# Patient Record
Sex: Female | Born: 1935 | Race: White | Hispanic: No | Smoking: Never smoker
Health system: Southern US, Community
[De-identification: ages and names within clinical notes are randomized; demographics above are authoritative.]

## PROBLEM LIST (undated history)

## (undated) DIAGNOSIS — N362 Urethral caruncle: Secondary | ICD-10-CM

## (undated) DIAGNOSIS — K259 Gastric ulcer, unspecified as acute or chronic, without hemorrhage or perforation: Secondary | ICD-10-CM

## (undated) DIAGNOSIS — Z923 Personal history of irradiation: Secondary | ICD-10-CM

## (undated) DIAGNOSIS — Z9221 Personal history of antineoplastic chemotherapy: Secondary | ICD-10-CM

## (undated) DIAGNOSIS — L570 Actinic keratosis: Secondary | ICD-10-CM

## (undated) DIAGNOSIS — E785 Hyperlipidemia, unspecified: Secondary | ICD-10-CM

## (undated) DIAGNOSIS — C439 Malignant melanoma of skin, unspecified: Secondary | ICD-10-CM

## (undated) HISTORY — DX: Hyperlipidemia, unspecified: E78.5

## (undated) HISTORY — DX: Urethral caruncle: N36.2

## (undated) HISTORY — DX: Malignant melanoma of skin, unspecified: C43.9

## (undated) HISTORY — PX: OTHER SURGICAL HISTORY: SHX169

## (undated) HISTORY — DX: Gastric ulcer, unspecified as acute or chronic, without hemorrhage or perforation: K25.9

## (undated) HISTORY — DX: Actinic keratosis: L57.0

---

## 1978-12-26 HISTORY — PX: OTHER SURGICAL HISTORY: SHX169

## 1984-12-26 HISTORY — PX: BREAST EXCISIONAL BIOPSY: SUR124

## 2000-12-26 DIAGNOSIS — C439 Malignant melanoma of skin, unspecified: Secondary | ICD-10-CM

## 2000-12-26 DIAGNOSIS — Z923 Personal history of irradiation: Secondary | ICD-10-CM

## 2000-12-26 HISTORY — DX: Personal history of irradiation: Z92.3

## 2000-12-26 HISTORY — DX: Malignant melanoma of skin, unspecified: C43.9

## 2000-12-26 HISTORY — PX: OTHER SURGICAL HISTORY: SHX169

## 2001-12-26 DIAGNOSIS — Z9221 Personal history of antineoplastic chemotherapy: Secondary | ICD-10-CM

## 2001-12-26 HISTORY — DX: Personal history of antineoplastic chemotherapy: Z92.21

## 2004-09-25 ENCOUNTER — Ambulatory Visit: Payer: Self-pay | Admitting: Oncology

## 2005-01-13 ENCOUNTER — Ambulatory Visit: Payer: Self-pay | Admitting: Oncology

## 2005-01-26 ENCOUNTER — Ambulatory Visit: Payer: Self-pay | Admitting: Oncology

## 2005-02-23 ENCOUNTER — Ambulatory Visit: Payer: Self-pay | Admitting: Oncology

## 2005-05-03 ENCOUNTER — Ambulatory Visit: Payer: Self-pay | Admitting: Internal Medicine

## 2005-06-01 ENCOUNTER — Ambulatory Visit: Payer: Self-pay | Admitting: Oncology

## 2005-06-25 ENCOUNTER — Ambulatory Visit: Payer: Self-pay | Admitting: Oncology

## 2005-09-16 ENCOUNTER — Ambulatory Visit: Payer: Self-pay | Admitting: Oncology

## 2005-10-04 ENCOUNTER — Ambulatory Visit: Payer: Self-pay | Admitting: Unknown Physician Specialty

## 2005-10-04 ENCOUNTER — Other Ambulatory Visit: Payer: Self-pay

## 2005-10-06 ENCOUNTER — Ambulatory Visit: Payer: Self-pay | Admitting: Oncology

## 2005-10-10 ENCOUNTER — Ambulatory Visit: Payer: Self-pay | Admitting: Unknown Physician Specialty

## 2005-10-26 ENCOUNTER — Ambulatory Visit: Payer: Self-pay | Admitting: Oncology

## 2006-02-03 ENCOUNTER — Ambulatory Visit: Payer: Self-pay | Admitting: Oncology

## 2006-02-23 ENCOUNTER — Ambulatory Visit: Payer: Self-pay | Admitting: Oncology

## 2006-03-10 ENCOUNTER — Ambulatory Visit: Payer: Self-pay | Admitting: Unknown Physician Specialty

## 2006-05-04 ENCOUNTER — Ambulatory Visit: Payer: Self-pay | Admitting: Internal Medicine

## 2006-06-02 ENCOUNTER — Ambulatory Visit: Payer: Self-pay | Admitting: Oncology

## 2006-06-25 ENCOUNTER — Ambulatory Visit: Payer: Self-pay | Admitting: Oncology

## 2006-07-27 ENCOUNTER — Ambulatory Visit: Payer: Self-pay | Admitting: Unknown Physician Specialty

## 2006-08-17 ENCOUNTER — Ambulatory Visit: Payer: Self-pay | Admitting: Internal Medicine

## 2006-10-10 ENCOUNTER — Ambulatory Visit: Payer: Self-pay | Admitting: Oncology

## 2006-10-26 ENCOUNTER — Ambulatory Visit: Payer: Self-pay | Admitting: Oncology

## 2007-01-04 ENCOUNTER — Ambulatory Visit: Payer: Self-pay | Admitting: Oncology

## 2007-01-30 ENCOUNTER — Ambulatory Visit: Payer: Self-pay | Admitting: Unknown Physician Specialty

## 2007-02-15 ENCOUNTER — Ambulatory Visit: Payer: Self-pay | Admitting: Oncology

## 2007-02-21 ENCOUNTER — Encounter: Payer: Self-pay | Admitting: Oncology

## 2007-02-24 ENCOUNTER — Ambulatory Visit: Payer: Self-pay | Admitting: Oncology

## 2007-02-24 ENCOUNTER — Encounter: Payer: Self-pay | Admitting: Oncology

## 2007-03-14 ENCOUNTER — Ambulatory Visit: Payer: Self-pay | Admitting: Oncology

## 2007-03-27 ENCOUNTER — Ambulatory Visit: Payer: Self-pay | Admitting: Oncology

## 2007-03-27 ENCOUNTER — Encounter: Payer: Self-pay | Admitting: Oncology

## 2007-05-07 ENCOUNTER — Ambulatory Visit: Payer: Self-pay | Admitting: Internal Medicine

## 2007-06-26 ENCOUNTER — Ambulatory Visit: Payer: Self-pay | Admitting: Oncology

## 2007-07-06 ENCOUNTER — Ambulatory Visit: Payer: Self-pay | Admitting: Oncology

## 2007-07-11 ENCOUNTER — Ambulatory Visit: Payer: Self-pay | Admitting: Oncology

## 2007-07-27 ENCOUNTER — Ambulatory Visit: Payer: Self-pay | Admitting: Oncology

## 2007-07-27 ENCOUNTER — Ambulatory Visit: Payer: Self-pay | Admitting: Unknown Physician Specialty

## 2007-10-11 ENCOUNTER — Ambulatory Visit: Payer: Self-pay | Admitting: Oncology

## 2007-10-27 ENCOUNTER — Ambulatory Visit: Payer: Self-pay | Admitting: Oncology

## 2007-12-27 ENCOUNTER — Ambulatory Visit: Payer: Self-pay | Admitting: Oncology

## 2008-01-10 ENCOUNTER — Ambulatory Visit: Payer: Self-pay | Admitting: Oncology

## 2008-01-27 ENCOUNTER — Ambulatory Visit: Payer: Self-pay | Admitting: Oncology

## 2008-04-25 ENCOUNTER — Ambulatory Visit: Payer: Self-pay | Admitting: Oncology

## 2008-05-07 ENCOUNTER — Ambulatory Visit: Payer: Self-pay | Admitting: Internal Medicine

## 2008-05-22 ENCOUNTER — Ambulatory Visit: Payer: Self-pay | Admitting: Oncology

## 2008-05-26 ENCOUNTER — Ambulatory Visit: Payer: Self-pay | Admitting: Oncology

## 2008-07-26 ENCOUNTER — Ambulatory Visit: Payer: Self-pay | Admitting: Oncology

## 2008-08-26 ENCOUNTER — Ambulatory Visit: Payer: Self-pay | Admitting: Dermatology

## 2008-10-26 ENCOUNTER — Ambulatory Visit: Payer: Self-pay | Admitting: Oncology

## 2008-11-19 ENCOUNTER — Ambulatory Visit: Payer: Self-pay | Admitting: Oncology

## 2008-11-25 ENCOUNTER — Ambulatory Visit: Payer: Self-pay | Admitting: Oncology

## 2009-04-25 ENCOUNTER — Ambulatory Visit: Payer: Self-pay | Admitting: Oncology

## 2009-05-12 ENCOUNTER — Ambulatory Visit: Payer: Self-pay | Admitting: Internal Medicine

## 2009-05-20 ENCOUNTER — Ambulatory Visit: Payer: Self-pay | Admitting: Oncology

## 2009-05-26 ENCOUNTER — Ambulatory Visit: Payer: Self-pay | Admitting: Oncology

## 2009-09-23 ENCOUNTER — Ambulatory Visit: Payer: Self-pay | Admitting: Oncology

## 2009-09-25 ENCOUNTER — Ambulatory Visit: Payer: Self-pay | Admitting: Oncology

## 2010-04-25 ENCOUNTER — Ambulatory Visit: Payer: Self-pay | Admitting: Oncology

## 2010-05-13 ENCOUNTER — Ambulatory Visit: Payer: Self-pay | Admitting: Internal Medicine

## 2010-05-20 ENCOUNTER — Ambulatory Visit: Payer: Self-pay | Admitting: Oncology

## 2010-05-26 ENCOUNTER — Ambulatory Visit: Payer: Self-pay | Admitting: Oncology

## 2011-05-16 ENCOUNTER — Ambulatory Visit: Payer: Self-pay | Admitting: Internal Medicine

## 2011-05-24 ENCOUNTER — Ambulatory Visit: Payer: Self-pay | Admitting: Oncology

## 2011-05-27 ENCOUNTER — Ambulatory Visit: Payer: Self-pay | Admitting: Oncology

## 2012-06-05 ENCOUNTER — Ambulatory Visit: Payer: Self-pay | Admitting: Oncology

## 2012-06-05 LAB — CBC CANCER CENTER
Basophil #: 0.1 x10 3/mm (ref 0.0–0.1)
Basophil %: 1.1 %
Eosinophil #: 0.1 x10 3/mm (ref 0.0–0.7)
Eosinophil %: 1.8 %
HCT: 36.4 % (ref 35.0–47.0)
HGB: 12.1 g/dL (ref 12.0–16.0)
MCH: 30.3 pg (ref 26.0–34.0)
MCV: 92 fL (ref 80–100)
Monocyte #: 0.7 x10 3/mm (ref 0.2–0.9)
Monocyte %: 10 %
Neutrophil #: 4.2 x10 3/mm (ref 1.4–6.5)
Neutrophil %: 56.3 %
Platelet: 205 x10 3/mm (ref 150–440)
RBC: 3.98 10*6/uL (ref 3.80–5.20)
RDW: 13.7 % (ref 11.5–14.5)
WBC: 7.4 x10 3/mm (ref 3.6–11.0)

## 2012-06-05 LAB — COMPREHENSIVE METABOLIC PANEL
Anion Gap: 8 (ref 7–16)
Calcium, Total: 9.4 mg/dL (ref 8.5–10.1)
Chloride: 103 mmol/L (ref 98–107)
Co2: 27 mmol/L (ref 21–32)
Creatinine: 0.89 mg/dL (ref 0.60–1.30)
EGFR (Non-African Amer.): >60
Glucose: 106 mg/dL — ABNORMAL HIGH (ref 65–99)
Osmolality: 278 (ref 275–301)
Potassium: 4.9 mmol/L (ref 3.5–5.1)

## 2012-06-25 ENCOUNTER — Ambulatory Visit: Payer: Self-pay | Admitting: Oncology

## 2012-08-24 ENCOUNTER — Ambulatory Visit: Payer: Self-pay | Admitting: Internal Medicine

## 2013-08-27 ENCOUNTER — Ambulatory Visit: Payer: Self-pay | Admitting: Internal Medicine

## 2013-09-09 ENCOUNTER — Observation Stay: Payer: Self-pay | Admitting: Internal Medicine

## 2013-09-09 LAB — URINALYSIS, COMPLETE
Bilirubin,UR: NEGATIVE
Blood: NEGATIVE
Glucose,UR: NEGATIVE mg/dL (ref 0–75)
Protein: NEGATIVE
RBC,UR: 3 /HPF (ref 0–5)
Specific Gravity: 1.01 (ref 1.003–1.030)
WBC UR: 18 /HPF (ref 0–5)

## 2013-09-09 LAB — COMPREHENSIVE METABOLIC PANEL
Alkaline Phosphatase: 63 U/L (ref 50–136)
BUN: 17 mg/dL (ref 7–18)
Bilirubin,Total: 0.3 mg/dL (ref 0.2–1.0)
Calcium, Total: 9.3 mg/dL (ref 8.5–10.1)
Co2: 26 mmol/L (ref 21–32)
Creatinine: 0.8 mg/dL (ref 0.60–1.30)
EGFR (African American): >60
EGFR (Non-African Amer.): >60
Glucose: 129 mg/dL — ABNORMAL HIGH (ref 65–99)
SGOT(AST): 29 U/L (ref 15–37)
Total Protein: 7.3 g/dL (ref 6.4–8.2)

## 2013-09-09 LAB — LIPID PANEL
HDL Cholesterol: 63 mg/dL — ABNORMAL HIGH (ref 40–60)
Triglycerides: 134 mg/dL (ref 0–200)

## 2013-09-09 LAB — CBC
HCT: 37.3 % (ref 35.0–47.0)
HGB: 12.9 g/dL (ref 12.0–16.0)
MCH: 31 pg (ref 26.0–34.0)
MCV: 90 fL (ref 80–100)
RDW: 13.8 % (ref 11.5–14.5)

## 2013-09-09 LAB — TSH: Thyroid Stimulating Horm: 3.96 u[IU]/mL

## 2013-09-09 LAB — HEMOGLOBIN A1C: Hemoglobin A1C: 5.5 % (ref 4.2–6.3)

## 2013-09-10 LAB — BASIC METABOLIC PANEL
BUN: 11 mg/dL (ref 7–18)
Co2: 23 mmol/L (ref 21–32)
EGFR (African American): >60
Glucose: 94 mg/dL (ref 65–99)
Osmolality: 277 (ref 275–301)
Potassium: 4 mmol/L (ref 3.5–5.1)
Sodium: 139 mmol/L (ref 136–145)

## 2013-09-10 LAB — CBC WITH DIFFERENTIAL/PLATELET
Eosinophil %: 1.3 %
HGB: 12 g/dL (ref 12.0–16.0)
Lymphocyte #: 2.2 10*3/uL (ref 1.0–3.6)
Lymphocyte %: 27.4 %
MCH: 31.1 pg (ref 26.0–34.0)
MCHC: 34.4 g/dL (ref 32.0–36.0)
MCV: 90 fL (ref 80–100)
Monocyte #: 0.8 x10 3/mm (ref 0.2–0.9)
Platelet: 190 10*3/uL (ref 150–440)
RBC: 3.86 10*6/uL (ref 3.80–5.20)
WBC: 8.1 10*3/uL (ref 3.6–11.0)

## 2013-09-10 LAB — TROPONIN I: Troponin-I: <0.02 ng/mL

## 2014-08-04 DIAGNOSIS — C4491 Basal cell carcinoma of skin, unspecified: Secondary | ICD-10-CM

## 2014-08-04 HISTORY — DX: Basal cell carcinoma of skin, unspecified: C44.91

## 2014-10-06 ENCOUNTER — Ambulatory Visit: Payer: Self-pay | Admitting: Internal Medicine

## 2015-01-28 ENCOUNTER — Ambulatory Visit: Payer: Self-pay | Admitting: Unknown Physician Specialty

## 2015-04-10 ENCOUNTER — Other Ambulatory Visit: Payer: Self-pay | Admitting: Unknown Physician Specialty

## 2015-04-10 DIAGNOSIS — R220 Localized swelling, mass and lump, head: Secondary | ICD-10-CM

## 2015-04-10 DIAGNOSIS — R221 Localized swelling, mass and lump, neck: Principal | ICD-10-CM

## 2015-04-17 NOTE — H&P (Signed)
PATIENT NAME:  Krystal Richardson, Krystal Richardson MR#:  [id number] DATE OF BIRTH:  09-09-1929  DATE OF ADMISSION:  09/09/2013  PRIMARY CARE PHYSICIAN:  Tracie Harrier, MD  REQUESTING PHYSICIAN:  Dr. Phineas Douglas.    CHIEF COMPLAINT: Dizziness.   HISTORY OF PRESENT ILLNESS: The patient is an 81 year old female with a known history of melanoma of the scalp followed by Dr. Oliva Bustard, history of peptic ulcer disease, who is being admitted for dizziness. The patient has been feeling nauseous for the last few days. This morning she was doing okay when she woke up but around 9:00 or 9:30 she started feeling really dizzy, could not change her position. Bending made it worse and laying down made it better. She was feeling so dizzy that she was feeling unsteady on her feet and her son decided to bring her down to the Emergency Department. While in the ED, her blood pressure was 182/79 mmHg and she is being admitted for further evaluation and management.   PAST MEDICAL HISTORY 1.  Hemorrhoids.  2.  Peptic ulcer disease.  3.  Cataract.  4.  History of melanoma of the scalp.  PAST SURGICAL HISTORY 1.  Hemorrhoid surgery in 1984.  2.  Cataract surgery.   FAMILY HISTORY: Mother had heart failure.   SOCIAL HISTORY: No smoking. No alcohol. She is married, lives with her husband. She used to work as a Network engineer job.   ALLERGIES: No known drug allergies.   MEDICATIONS AT HOME 1.  Calcium carbonate 1 tablet p.o. 2 times a week.  2.  Centrum Silver once daily.  3.  Fish once daily.  4.  Zocor 20 mg p.o. at bedtime.   REVIEW OF SYSTEMS CONSTITUTIONAL: No fever, fatigue, weakness.  EYES: No blurred or double vision.  ENT: No tinnitus or ear pain, positive for positional vertigo.  RESPIRATORY: No cough, wheezing, hemoptysis.  CARDIOVASCULAR: No chest pain, orthopnea, edema.  GASTROINTESTINAL: Positive for nausea for the last few days. No vomiting or constipation, diarrhea.  GENITOURINARY: No dysuria or hematuria.  ENDOCRINE:  No polyuria or nocturia.  HEMATOLOGY: No anemia or easy bruising.  SKIN: No rash or lesion.  MUSCULOSKELETAL: No arthritis or muscle cramp.  NEUROLOGIC: Positive for vertigo and difficulty walking due to balance issues. No tingling or numbness.  PSYCHIATRIC: No history of anxiety or depression.   PHYSICAL EXAMINATION VITAL SIGNS: Temperature 98.3, heart rate 75 per minute, respirations 18 per minute, blood pressure 182/79 mmHg, she was saturating 99% on room air.  GENERAL: The patient is an 81 year old female lying in the bed comfortably without any acute distress.  EYES: Pupils equal, round, reactive to light and accommodation. No scleral icterus. Extraocular muscles intact.  HEAD: Atraumatic, normocephalic. Oropharynx and nasopharynx clear.  NECK: Supple. No carotid distention. She had no thyroid enlargement or tenderness.  LUNGS: Clear to auscultation bilaterally. No wheezing, rales, rhonchi.  CARDIOVASCULAR: S1, S2 normal. No murmurs, rubs or gallops. ABDOMEN: Soft, nontender, nondistended. Bowel sounds present. No organomegaly or masses.  EXTREMITIES: No pedal edema, cyanosis or clubbing.  Gait was not checked as when ER doctor tried to check she was very unsteady and was at risk for falling.  PSYCHIATRIC:  The patient is alert and oriented x 3.  NEURO:  Cranial nerves II through XII.  Muscle strength 5 out of 5. Extremity sensory sensation intact.  SKIN: No obvious rash, lesion or ulcer.  MUSCULOSKELETAL: No joint effusion or pathology visible.   LABORATORY DATA: Normal CBC. Normal TSH. Normal first set of troponins.  Normal liver function tests. Normal BMP. UA showed 18 WBCs, 3+  leukocyte esterase, no bacteria.   IMAGING 1.  Chest x-ray in the ED showed no acute cardiopulmonary disease, possible COPD.   2.  CT scan of the head without contrast showed no acute intracranial abnormality, atrophy with chronic microvascular ischemic disease present. No depressed skull fracture. Chronic  opacification of the mastoid air cells present.  3.  EKG showed normal sinus rhythm, no major ST-T changes.   IMPRESSION AND PLAN 1.  Dizziness, likely secondary to benign positional vertigo, cannot rule out that possibly she did have some orthostatic changes in the ED with a blood pressure dropping from 195 in the laid down position to 177 in the sitting. Certainly cannot rule out serious pathology like stroke, for which will put her on observation, get MRI of the brain, carotid Dopplers. Will also check orthostatic vitals and get hemoglobin A1c, TSH and lipid profile check. Will also get physical therapy evaluation.  Will start her on meclizine at this time to see if it helps.  2.  Hypertension. Again her blood pressure seems high although the patient indicates that she has had a similar issue when she saw her family doctor in August but then at home it has been normal. We will hold off starting new medication at this time and monitor on off-unit telemetry.  3.  History of peptic ulcer disease. We will start her on Protonix.  4.  Hyperlipidemia. Will continue Zocor. Check fasting lipid profile.  5.  CODE STATUS: Full code.   TOTAL TIME TAKING CARE OF THIS PATIENT: 45 minutes.   ____________________________ Lucina Mellow. Manuella Ghazi, MD vss:cs D: 09/09/2013 16:58:00 ET T: 09/09/2013 18:11:31 ET JOB#: [id number]  cc: Elif Yonts S. Manuella Ghazi, MD, <Dictator> Tracie Harrier, MD Lucina Mellow [facility] MD ELECTRONICALLY SIGNED 09/10/2013 14:53

## 2015-04-17 NOTE — Discharge Summary (Signed)
PATIENT NAME:  Krystal Richardson, SCHEY MR#:  [id number] DATE OF BIRTH:  07/20/1929  DATE OF ADMISSION:  09/09/2013  DATE OF DISCHARGE:  09/11/2013  DIAGNOSES:  1.  Accelerated hypertension with dizziness. 2.  Possible ischemic stroke involving upper pons.   CHIEF COMPLAINT:  Dizziness, elevated blood pressure.   HISTORY OF PRESENT ILLNESS:  Krystal Richardson is an 81 year old female with a history of melanoma, history of peptic ulcer disease, presents to the Emergency Room complaining of nausea associated with dizziness. The patient was also noted to have an elevated blood pressure of 182/79.   PAST MEDICAL HISTORY: Significant for hemorrhoids, peptic ulcer disease, cataracts, history of melanoma of the scalp. She has had previous hemorrhoid surgery and cataract surgery.   PHYSICAL EXAMINATION: VITAL SIGNS:  Temp was 98.3, respirations 18, heart rate 75, blood pressure 182/79.  GENERAL:  She was not in distress.  HEENT:  Normocephalic, atraumatic. Extraocular movements were intact.  HEART:  S1, S2.  LUNGS:  Clear to auscultation.  ABDOMEN:  Soft, nontender.  EXTREMITIES:  No edema.  NEUROLOGIC:  Alert and oriented x 3. No obvious focal weakness.  LABS:  Glucose 129, sodium 135, but improved to 139, potassium 4.0, chloride 106, CO2 of 26. Cholesterol 165, triglycerides 134, HDL of 63. WBC count 9, hemoglobin 12.9, hematocrit 37.3, and platelet count was 188. Urine showed 18 WBCs, 3+ leukocyte esterase, no bacteria. Ultrasound of the carotids showed atherosclerotic disease without evidence of hemodynamically significant stenosis. There is an antegrade flow in both vertebrals. MRI of the brain showed a small area of increased signal in one image within the upper pons that corresponds to an area of subtle decreased density on the CT scan, and could reflect an area of focal ischemic change. No other intracranial mass or hydrocephalus was detected.   HOSPITAL COURSE: The patient was admitted to [facility]. She was  also seen by Physical Therapy, and blood pressure was monitored closely. Blood pressure gradually improved to baseline normal levels. She was started on aspirin 325 mg once a day, and overall the patient felt improved. Her nausea and unsteadiness significantly improved, and she was allowed home on the following day on these medications:  Meclizine 25 mg t.i.d. p.r.n., aspirin 325 mg once a day, Zocor 20 mg once a day, Centrum Silver 1 tablet once a day, and calcium carbonate 1 tablet 2 times a week.   The patient was advised a low-sodium diet and follow up with me, Dr. Ginette Pitman, in 1 to 2 weeks' time.   Total time spent in discharge of this patient:  30 minutes.     ____________________________ Tracie Harrier, MD vh:mr D: 09/12/2013 17:44:48 ET T: 09/12/2013 19:27:36 ET JOB#: [id number]  cc: Tracie Harrier, MD, <Dictator> Tracie Harrier MD ELECTRONICALLY SIGNED 09/30/2013 18:35

## 2015-06-25 ENCOUNTER — Ambulatory Visit (INDEPENDENT_AMBULATORY_CARE_PROVIDER_SITE_OTHER): Payer: Medicare Other | Admitting: Urology

## 2015-06-25 ENCOUNTER — Encounter: Payer: Self-pay | Admitting: Urology

## 2015-06-25 VITALS — BP 177/69 | HR 76 | Ht 60.0 in | Wt 116.1 lb

## 2015-06-25 DIAGNOSIS — E785 Hyperlipidemia, unspecified: Secondary | ICD-10-CM | POA: Insufficient documentation

## 2015-06-25 DIAGNOSIS — N362 Urethral caruncle: Secondary | ICD-10-CM

## 2015-06-25 DIAGNOSIS — M810 Age-related osteoporosis without current pathological fracture: Secondary | ICD-10-CM | POA: Insufficient documentation

## 2015-06-25 DIAGNOSIS — C434 Malignant melanoma of scalp and neck: Secondary | ICD-10-CM | POA: Insufficient documentation

## 2015-06-25 NOTE — Progress Notes (Signed)
06/25/2015 2:15 PM   Krystal Richardson [birth year] [id number]  Referring provider: No referring provider defined for this encounter.  Chief Complaint  Patient presents with  . Other    uretheral lesion    HPI: Krystal Richardson is an 81 year old white female who found two spots of blood on her panties on one occasion.  She sought treatment with her primary care physician, Dr. Ginette Pitman, who found an erythematous area with tenderness noted in urethral orifice. Her urinalysis at that visit did have 0-3 RBC's per high-power field and her urinary culture was negative. She was referred to [place] for further evaluation.  She denies any gross hematuria, vaginal bleeding or discharge, difficulty emptying her bladder, dysuria, recurrent urinary tract infections or straining to urinate. She has not had any recent fevers, chills, nausea or vomiting.  Her baseline urinary symptoms is an occasional nocturia 1.   PMH: Past Medical History  Diagnosis Date  . Melanoma   . Hyperlipidemia   . Stomach ulcer     Surgical History: Past Surgical History  Procedure Laterality Date  . Cataract    . Hemmriodectomy  1980  . Melanoma  2002    Home Medications:    Medication List       This list is accurate as of: 06/25/15  2:15 PM.  Always use your most recent med list.               Fish Oil 1000 MG Caps  Take by mouth.     fluticasone 50 MCG/ACT nasal spray  Commonly known as:  FLONASE  Place into the nose.     levothyroxine 25 MCG tablet  Commonly known as:  SYNTHROID, LEVOTHROID  Take by mouth.     multivitamin tablet  Take 1 tablet by mouth daily.     simvastatin 20 MG tablet  Commonly known as:  ZOCOR  Take by mouth.        Allergies:  Allergies  Allergen Reactions  . Toprol Xl [Metoprolol Tartrate]     Family History: Family History  Problem Relation Age of Onset  . Family history unknown: Yes    Social History:  reports that she has never smoked. She does not have any  smokeless tobacco history on file. She reports that she does not drink alcohol or use illicit drugs.  ROS: Urological Symptom Review  Patient is experiencing the following symptoms: Get up at night to urinate   Review of Systems  Gastrointestinal (upper)  : Negative for upper GI symptoms  Gastrointestinal (lower) : Negative for lower GI symptoms  Constitutional : Negative for symptoms  Skin: Negative for skin symptoms  Eyes: Negative for eye symptoms  Ear/Nose/Throat : Negative for Ear/Nose/Throat symptoms  Hematologic/Lymphatic: Negative for Hematologic/Lymphatic symptoms  Cardiovascular : Negative for cardiovascular symptoms  Respiratory : Negative for respiratory symptoms  Endocrine: Negative for endocrine symptoms  Musculoskeletal: Negative for musculoskeletal symptoms  Neurological: Negative for neurological symptoms  Psychologic: Negative for psychiatric symptoms   Physical Exam: BP 177/69 mmHg  Pulse 76  Ht 5' (1.524 m)  Wt 116 lb 1.6 oz (52.663 kg)  BMI 22.67 kg/m2  Constitutional:  Alert and oriented, No acute distress. HEENT: [name] AT, moist mucus membranes.  Trachea midline, no masses. Cardiovascular: No clubbing, cyanosis, or edema. Respiratory: Normal respiratory effort, no increased work of breathing. GI: Abdomen is soft, nontender, nondistended, no abdominal masses GU: No CVA tenderness. Normal external genitalia.  Normal urethral meatus.  Urethral caruncle is  noted. No bladder fullness or masses. No vaginal lesions or discharge. Normal rectal tone, no masses. Normal anus and perineum.  Skin: No rashes, bruises or suspicious lesions. Lymph: No cervical or inguinal adenopathy. Neurologic: Grossly intact, no focal deficits, moving all 4 extremities. Psychiatric: Normal mood and affect.  Laboratory Data: Results for orders placed or performed in visit on 06/25/15  Microscopic Examination  Result Value Ref Range   WBC, UA 0-5 0 -  5 /hpf    RBC, UA 0-2 0 -  2 /hpf   Epithelial Cells (non renal) 0-10 0 - 10 /hpf   Bacteria, UA Few None seen/Few  Urinalysis, Complete  Result Value Ref Range   Specific Gravity, UA 1.015 1.005 - 1.030   pH, UA 7.0 5.0 - 7.5   Color, UA Yellow Yellow   Appearance Ur Clear Clear   Leukocytes, UA Trace (A) Negative   Protein, UA Negative Negative/Trace   Glucose, UA Negative Negative   Ketones, UA Negative Negative   RBC, UA Negative Negative   Bilirubin, UA Negative Negative   Urobilinogen, Ur 0.2 0.2 - 1.0 mg/dL   Nitrite, UA Negative Negative   Microscopic Examination See below:    Lab Results  Component Value Date   WBC 8.1 09/10/2013   HGB 12.0 09/10/2013   HCT 34.9* 09/10/2013   MCV 90 09/10/2013   PLT 190 09/10/2013    Lab Results  Component Value Date   CREATININE 0.64 09/10/2013    No results found for: PSA  No results found for: TESTOSTERONE  No results found for: HGBA1C  Urinalysis No results found for: COLORURINE, APPEARANCEUR, LABSPEC, PHURINE, GLUCOSEU, HGBUR, BILIRUBINUR, KETONESUR, PROTEINUR, UROBILINOGEN, NITRITE, LEUKOCYTESUR  Pertinent Imaging:   Assessment & Plan:    1. Urethral caruncle:  Patient was found to have urethral caruncle on exam. Patient was given a sample of vaginal estrogen cream (Premarin) and instructed to apply 0.5mg  (pea-sized amount) to the caruncle with a finger-tip every night for two weeks and then Monday, Wednesday and Friday nights.  I explained to the patient that vaginally administered estrogen, which causes only a slight increase in the blood estrogen levels, have fewer contraindications and adverse systemic effects that oral HT.  She will return in 2 weeks for reexamination of the urethral caruncle.  - Urinalysis, Complete   No Follow-up on file.  Zara Council, [facility] [address], [place], [place] [address] [phone]

## 2015-06-26 LAB — MICROSCOPIC EXAMINATION

## 2015-06-26 LAB — URINALYSIS, COMPLETE
BILIRUBIN UA: NEGATIVE
Glucose, UA: NEGATIVE
KETONES UA: NEGATIVE
Nitrite, UA: NEGATIVE
Protein, UA: NEGATIVE
RBC, UA: NEGATIVE
Specific Gravity, UA: 1.015 (ref 1.005–1.030)
Urobilinogen, Ur: 0.2 mg/dL (ref 0.2–1.0)
pH, UA: 7 (ref 5.0–7.5)

## 2015-06-28 DIAGNOSIS — N362 Urethral caruncle: Secondary | ICD-10-CM | POA: Insufficient documentation

## 2015-07-16 ENCOUNTER — Ambulatory Visit (INDEPENDENT_AMBULATORY_CARE_PROVIDER_SITE_OTHER): Payer: Medicare Other | Admitting: Urology

## 2015-07-16 ENCOUNTER — Encounter: Payer: Self-pay | Admitting: Urology

## 2015-07-16 VITALS — BP 162/67 | HR 66 | Ht 60.0 in | Wt 116.0 lb

## 2015-07-16 DIAGNOSIS — N362 Urethral caruncle: Secondary | ICD-10-CM

## 2015-07-16 NOTE — Progress Notes (Signed)
07/16/2015 11:05 AM   Deborah Chalk November 21, 1929 [id number]  Referring provider: Tracie Harrier, MD [place], [place] [address]  Chief Complaint  Patient presents with  . Uretheral lesion    2 week follow up    HPI: Krystal Richardson is an 81 year old white female who was found to have a urethral caruncle 2 weeks ago during her office visit with me.  I given her vaginal estrogen cream and instructed her to apply nightly for 2 weeks and then presented today for reexamination.  She did not experience any problems administering the cream or any vaginal burning with the cream. She feels the caruncle has reduced in size since she last presented to [place]. She's not had any gross hematuria or spotting in her underwear.   PMH: Past Medical History  Diagnosis Date  . Melanoma   . Hyperlipidemia   . Stomach ulcer     Surgical History: Past Surgical History  Procedure Laterality Date  . Cataract    . Hemmriodectomy  1980  . Melanoma  2002    Home Medications:    Medication List       This list is accurate as of: 07/16/15 11:05 AM.  Always use your most recent med list.               Calcium Carbonate-Vitamin D3 600-400 MG-UNIT Tabs  Take by mouth.     Fish Oil 1000 MG Caps  Take by mouth.     fluticasone 50 MCG/ACT nasal spray  Commonly known as:  FLONASE  Place into the nose.     levothyroxine 25 MCG tablet  Commonly known as:  SYNTHROID, LEVOTHROID  Take by mouth.     multivitamin tablet  Take 1 tablet by mouth daily.     simvastatin 20 MG tablet  Commonly known as:  ZOCOR  Take by mouth.        Allergies:  Allergies  Allergen Reactions  . Toprol Xl [Metoprolol Tartrate]     Family History: Family History  Problem Relation Age of Onset  . Kidney disease Neg Hx   . Bladder Cancer Neg Hx   . Prostate cancer Neg Hx     Social History:  reports that she has never smoked. She does not have any smokeless tobacco history on file. She  reports that she does not drink alcohol or use illicit drugs.  ROS: UROLOGY Frequent Urination?: No Hard to postpone urination?: No Burning/pain with urination?: No Get up at night to urinate?: Yes Leakage of urine?: No Urine stream starts and stops?: No Trouble starting stream?: No Do you have to strain to urinate?: No Blood in urine?: No Urinary tract infection?: No Sexually transmitted disease?: No Injury to kidneys or bladder?: No Painful intercourse?: No Weak stream?: No Currently pregnant?: No Vaginal bleeding?: No Last menstrual period?: n  Gastrointestinal Nausea?: No Vomiting?: No Indigestion/heartburn?: No Diarrhea?: No Constipation?: No  Constitutional Fever: No Night sweats?: No Weight loss?: No Fatigue?: No  Skin Skin rash/lesions?: No Itching?: No  Eyes Blurred vision?: No Double vision?: No  Ears/Nose/Throat Sore throat?: No Sinus problems?: No  Hematologic/Lymphatic Swollen glands?: No Easy bruising?: No  Cardiovascular Leg swelling?: No Chest pain?: No  Respiratory Cough?: No Shortness of breath?: No  Endocrine Excessive thirst?: No  Musculoskeletal Back pain?: No Joint pain?: No  Neurological Headaches?: No Dizziness?: No  Psychologic Depression?: No Anxiety?: No  Physical Exam: BP 162/67 mmHg  Pulse 66  Ht 5' (  1.524 m)  Wt 116 lb (52.617 kg)  BMI 22.65 kg/m2  GU: No CVA tenderness. Normal external genitalia. Normal urethral meatus. Urethral caruncle is noted and is diminished in size from 5 mm in size to 2 mm in size. No bladder fullness or masses. No vaginal lesions or discharge. Normal rectal tone, no masses. Normal anus and perineum.   Laboratory Data: Lab Results  Component Value Date   WBC 8.1 09/10/2013   HGB 12.0 09/10/2013   HCT 34.9* 09/10/2013   MCV 90 09/10/2013   PLT 190 09/10/2013    Lab Results  Component Value Date   CREATININE 0.64 09/10/2013    No results found for: PSA  No results  found for: TESTOSTERONE  No results found for: HGBA1C  Urinalysis    Component Value Date/Time   COLORURINE Yellow 09/09/2013 1201   APPEARANCEUR Clear 09/09/2013 1201   LABSPEC 1.010 09/09/2013 1201   PHURINE 6.0 09/09/2013 1201   GLUCOSEU Negative 06/25/2015 1416   GLUCOSEU Negative 09/09/2013 1201   HGBUR Negative 09/09/2013 1201   BILIRUBINUR Negative 06/25/2015 1416   BILIRUBINUR Negative 09/09/2013 1201   KETONESUR Negative 09/09/2013 1201   PROTEINUR Negative 09/09/2013 1201   NITRITE Negative 06/25/2015 1416   NITRITE Negative 09/09/2013 1201   LEUKOCYTESUR Trace* 06/25/2015 1416   LEUKOCYTESUR 3+ 09/09/2013 1201    Pertinent Imaging:   Assessment & Plan:    1. Urethral caruncle: Patient's urethral caruncle has diminished in size. Patient was given another sample of vaginal estrogen cream (Premarin) and instructed to apply 0.5mg  (pea-sized amount) to the caruncle with a finger-tip Monday, Wednesday and Friday nights. She will return in 3 months for reexamination of the urethral caruncle.   There are no diagnoses linked to this encounter.  Return in about 3 months (around 10/16/2015) for exam.  Zara Council, [facility]  [facility] [address] [place], [place] [address] [phone]

## 2015-07-29 ENCOUNTER — Ambulatory Visit
Admission: RE | Admit: 2015-07-29 | Discharge: 2015-07-29 | Disposition: A | Discharge status: Home / Self Care | Payer: Medicare Other | Source: Ambulatory Visit | Attending: Unknown Physician Specialty | Admitting: Unknown Physician Specialty

## 2015-07-29 DIAGNOSIS — E042 Nontoxic multinodular goiter: Secondary | ICD-10-CM | POA: Diagnosis not present

## 2015-07-29 DIAGNOSIS — R221 Localized swelling, mass and lump, neck: Secondary | ICD-10-CM

## 2015-07-29 DIAGNOSIS — R220 Localized swelling, mass and lump, head: Secondary | ICD-10-CM

## 2015-09-09 ENCOUNTER — Other Ambulatory Visit: Payer: Self-pay | Admitting: Internal Medicine

## 2015-09-09 DIAGNOSIS — Z1231 Encounter for screening mammogram for malignant neoplasm of breast: Secondary | ICD-10-CM

## 2015-09-09 DIAGNOSIS — E042 Nontoxic multinodular goiter: Secondary | ICD-10-CM | POA: Insufficient documentation

## 2015-10-09 ENCOUNTER — Ambulatory Visit
Admission: RE | Admit: 2015-10-09 | Discharge: 2015-10-09 | Disposition: A | Discharge status: Home / Self Care | Payer: Medicare Other | Source: Ambulatory Visit | Attending: Internal Medicine | Admitting: Internal Medicine

## 2015-10-09 ENCOUNTER — Other Ambulatory Visit: Payer: Self-pay | Admitting: Internal Medicine

## 2015-10-09 DIAGNOSIS — Z1231 Encounter for screening mammogram for malignant neoplasm of breast: Secondary | ICD-10-CM | POA: Insufficient documentation

## 2015-10-09 HISTORY — DX: Personal history of irradiation: Z92.3

## 2015-10-09 HISTORY — DX: Personal history of antineoplastic chemotherapy: Z92.21

## 2015-10-16 ENCOUNTER — Encounter: Payer: Self-pay | Admitting: Urology

## 2015-10-16 ENCOUNTER — Ambulatory Visit (INDEPENDENT_AMBULATORY_CARE_PROVIDER_SITE_OTHER): Payer: Medicare Other | Admitting: Urology

## 2015-10-16 VITALS — BP 152/77 | HR 71 | Ht 60.0 in | Wt 116.1 lb

## 2015-10-16 DIAGNOSIS — N362 Urethral caruncle: Secondary | ICD-10-CM

## 2015-10-16 NOTE — Progress Notes (Signed)
8:54 AM   Krystal Richardson [birth year] [id number]  Referring provider: Tracie Harrier, MD [address] [facility] [place], [place] [address]  Chief Complaint  Patient presents with  . Urethral caruncle    3 month follow up    HPI: Krystal Richardson is an 81 year old white female who was found to have a urethral caruncle 3 months ago during her office visit with me. She has been applying the cream three nights weekly.    She did not experience any problems administering the cream or any vaginal burning with the cream. She feels the caruncle has not changed in size since she last presented to [place]. She's not had any gross hematuria or spotting in her underwear.   PMH: Past Medical History  Diagnosis Date  . Hyperlipidemia   . Stomach ulcer   . Melanoma ([place]) 2002  . Personal history of chemotherapy 2003    MELANOMA  . Personal history of radiation therapy 2002    MELANOMA  . Urethral caruncle     Surgical History: Past Surgical History  Procedure Laterality Date  . Cataract    . Hemmriodectomy  1980  . Melanoma  2002  . Breast biopsy Left 1986    EXCISIONAL - NEG    Home Medications:    Medication List       This list is accurate as of: 10/16/15  8:54 AM.  Always use your most recent med list.               Calcium Carbonate-Vitamin D3 600-400 MG-UNIT Tabs  Take by mouth.     Fish Oil 1000 MG Caps  Take by mouth.     fluticasone 50 MCG/ACT nasal spray  Commonly known as:  FLONASE  Place into the nose.     levothyroxine 25 MCG tablet  Commonly known as:  SYNTHROID, LEVOTHROID  Take 25 mcg by mouth daily before breakfast.     levothyroxine 25 MCG tablet  Commonly known as:  SYNTHROID, LEVOTHROID  Take by mouth.     multivitamin tablet  Take 1 tablet by mouth daily.     simvastatin 20 MG tablet  Commonly known as:  ZOCOR  Take by mouth.        Allergies:  Allergies  Allergen Reactions  . Toprol Xl [Metoprolol Tartrate]      Family History: Family History  Problem Relation Age of Onset  . Kidney disease Neg Hx   . Bladder Cancer Neg Hx   . Prostate cancer Neg Hx   . Breast cancer Neg Hx     Social History:  reports that she has never smoked. She does not have any smokeless tobacco history on file. She reports that she does not drink alcohol or use illicit drugs.  ROS: UROLOGY Frequent Urination?: No Hard to postpone urination?: No Burning/pain with urination?: No Get up at night to urinate?: No Leakage of urine?: No Urine stream starts and stops?: No Trouble starting stream?: No Do you have to strain to urinate?: No Blood in urine?: No Urinary tract infection?: No Sexually transmitted disease?: No Injury to kidneys or bladder?: No Painful intercourse?: No Weak stream?: No Currently pregnant?: No Vaginal bleeding?: No Last menstrual period?: n  Gastrointestinal Nausea?: No Vomiting?: No Indigestion/heartburn?: No Diarrhea?: No Constipation?: No  Constitutional Fever: No Night sweats?: No Weight loss?: No Fatigue?: No  Skin Skin rash/lesions?: No Itching?: No  Eyes Blurred vision?: No Double vision?: No  Ears/Nose/Throat  Sore throat?: No Sinus problems?: No  Hematologic/Lymphatic Swollen glands?: No Easy bruising?: No  Cardiovascular Leg swelling?: No Chest pain?: No  Respiratory Cough?: No Shortness of breath?: No  Endocrine Excessive thirst?: No  Musculoskeletal Back pain?: No Joint pain?: No  Neurological Headaches?: No Dizziness?: No  Psychologic Depression?: No Anxiety?: No  Physical Exam: BP 152/77 mmHg  Pulse 71  Ht 5' (1.524 m)  Wt 116 lb 1.6 oz (52.663 kg)  BMI 22.67 kg/m2  GU: No CVA tenderness. Normal external genitalia. Normal urethral meatus. Urethral caruncle is noted and is diminished in size from 5 mm in size to 2 mm in size. No bladder fullness or masses. No vaginal lesions or discharge. Normal rectal tone, no masses. Normal  anus and perineum.   Laboratory Data: Lab Results  Component Value Date   WBC 8.1 09/10/2013   HGB 12.0 09/10/2013   HCT 34.9* 09/10/2013   MCV 90 09/10/2013   PLT 190 09/10/2013    Lab Results  Component Value Date   CREATININE 0.64 09/10/2013    Lab Results  Component Value Date   HGBA1C 5.5 09/09/2013     Assessment & Plan:    1. Urethral caruncle: Patient's urethral caruncle has remained stable in size. We did not have samples of vaginal cream to give to her today, but she has a tube at home.  Her insurance will not cover the medication.  She will contact the office when her current tube gets low to see if we have samples available at that time.  She will continue to apply a 3 nights weekly. She will return in 3 months for reexamination of the urethral caruncle.   Return in about 3 months (around 01/16/2016) for exam of caruncle.  Zara Council, [facility] [address], [place], [place] [address] [phone]

## 2016-01-15 ENCOUNTER — Ambulatory Visit (INDEPENDENT_AMBULATORY_CARE_PROVIDER_SITE_OTHER): Payer: Medicare Other | Admitting: Urology

## 2016-01-15 ENCOUNTER — Other Ambulatory Visit: Payer: Self-pay | Admitting: Urology

## 2016-01-15 ENCOUNTER — Encounter: Payer: Self-pay | Admitting: Urology

## 2016-01-15 VITALS — BP 159/82 | HR 73 | Ht 62.0 in | Wt 115.1 lb

## 2016-01-15 DIAGNOSIS — N362 Urethral caruncle: Secondary | ICD-10-CM

## 2016-01-15 MED ORDER — ESTRADIOL 0.1 MG/GM VA CREA: 1.0000 | TOPICAL_CREAM | Freq: Every day | VAGINAL | Status: DC

## 2016-01-15 MED ORDER — ESTROGENS, CONJUGATED 0.625 MG/GM VA CREA: 1.0000 | TOPICAL_CREAM | Freq: Every day | VAGINAL | Status: DC

## 2016-01-15 MED ORDER — ESTRADIOL 0.1 MG/GM VA CREA: TOPICAL_CREAM | VAGINAL | Status: DC

## 2016-01-15 NOTE — Progress Notes (Signed)
9:31 AM   Krystal Richardson 12/16/1929 [id number]  Referring provider: Tracie Harrier, MD [address] [facility] [place], [place] [address]  Chief Complaint  Patient presents with  . Urethral Caruncle    3 month follow up    HPI: Patient is an 82 year old Caucasian female who was found to have a urethral caruncle 6 months ago during her office visit with me. She has been applying the cream three nights weekly.    She did not experience any problems administering the cream or any vaginal burning with the cream. She feels the caruncle has not changed in size since she last presented to [place]. She's not had any gross hematuria or spotting in her underwear.   PMH: Past Medical History  Diagnosis Date  . Hyperlipidemia   . Stomach ulcer   . Melanoma ([place]) 2002  . Personal history of chemotherapy 2003    MELANOMA  . Personal history of radiation therapy 2002    MELANOMA  . Urethral caruncle     Surgical History: Past Surgical History  Procedure Laterality Date  . Cataract    . Hemmriodectomy  1980  . Melanoma  2002  . Breast biopsy Left 1986    EXCISIONAL - NEG    Home Medications:    Medication List       This list is accurate as of: 01/15/16  9:31 AM.  Always use your most recent med list.               Calcium Carbonate-Vitamin D3 600-400 MG-UNIT Tabs  Take by mouth.     ESTRACE VAGINAL 0.1 MG/GM vaginal cream  Generic drug:  estradiol  Place 1 Applicatorful vaginally at bedtime.     Fish Oil 1000 MG Caps  Take by mouth.     fluticasone 50 MCG/ACT nasal spray  Commonly known as:  FLONASE  Place into the nose.     levothyroxine 25 MCG tablet  Commonly known as:  SYNTHROID, LEVOTHROID  Take 25 mcg by mouth daily before breakfast.     levothyroxine 25 MCG tablet  Commonly known as:  SYNTHROID, LEVOTHROID  Take by mouth.     multivitamin tablet  Take 1 tablet by mouth daily.     simvastatin 20 MG tablet  Commonly known as:   ZOCOR  Take by mouth.        Allergies:  Allergies  Allergen Reactions  . Toprol Xl [Metoprolol Tartrate]     Family History: Family History  Problem Relation Age of Onset  . Kidney disease Neg Hx   . Bladder Cancer Neg Hx   . Prostate cancer Neg Hx   . Breast cancer Neg Hx     Social History:  reports that she has never smoked. She does not have any smokeless tobacco history on file. She reports that she does not drink alcohol or use illicit drugs.  ROS: UROLOGY Frequent Urination?: No Hard to postpone urination?: No Burning/pain with urination?: No Get up at night to urinate?: No Leakage of urine?: No Urine stream starts and stops?: No Trouble starting stream?: No Do you have to strain to urinate?: No Blood in urine?: No Urinary tract infection?: No Sexually transmitted disease?: No Injury to kidneys or bladder?: No Painful intercourse?: No Weak stream?: No Currently pregnant?: No Vaginal bleeding?: No Last menstrual period?: n  Gastrointestinal Nausea?: No Vomiting?: No Indigestion/heartburn?: No Diarrhea?: No Constipation?: No  Constitutional Fever: No Night sweats?: No Weight  loss?: No Fatigue?: No  Skin Skin rash/lesions?: No Itching?: No  Eyes Blurred vision?: No Double vision?: No  Ears/Nose/Throat Sore throat?: No Sinus problems?: No  Hematologic/Lymphatic Swollen glands?: No Easy bruising?: No  Cardiovascular Leg swelling?: No Chest pain?: No  Respiratory Cough?: No Shortness of breath?: No  Endocrine Excessive thirst?: No  Musculoskeletal Back pain?: No Joint pain?: No  Neurological Headaches?: No Dizziness?: No  Psychologic Depression?: No Anxiety?: No  Physical Exam: BP 159/82 mmHg  Pulse 73  Ht 5\' 2"  (1.575 m)  Wt 115 lb 1.6 oz (52.209 kg)  BMI 21.05 kg/m2  GU: No CVA tenderness. Normal external genitalia. Normal urethral meatus. Urethral caruncle is noted (2 mm in size). No bladder fullness or  masses. No vaginal lesions or discharge. Normal rectal tone, no masses. Normal anus and perineum.   Laboratory Data: Lab Results  Component Value Date   WBC 8.1 09/10/2013   HGB 12.0 09/10/2013   HCT 34.9* 09/10/2013   MCV 90 09/10/2013   PLT 190 09/10/2013    Lab Results  Component Value Date   CREATININE 0.64 09/10/2013    Lab Results  Component Value Date   HGBA1C 5.5 09/09/2013     Assessment & Plan:    1. Urethral caruncle:  She will continue to apply the vaginal cream 3 nights weekly. She will return in 12 months for reexamination of the urethral caruncle.   Return in about 1 year (around 01/14/2017) for exam.  Zara Council, [facility]  [facility] [address] [place], [place] [address] [phone]

## 2016-02-01 ENCOUNTER — Other Ambulatory Visit: Payer: Self-pay | Admitting: Unknown Physician Specialty

## 2016-02-01 DIAGNOSIS — E041 Nontoxic single thyroid nodule: Secondary | ICD-10-CM

## 2016-08-01 ENCOUNTER — Ambulatory Visit
Admission: RE | Admit: 2016-08-01 | Discharge: 2016-08-01 | Disposition: A | Discharge status: Home / Self Care | Payer: Medicare Other | Source: Ambulatory Visit | Attending: Unknown Physician Specialty | Admitting: Unknown Physician Specialty

## 2016-08-01 DIAGNOSIS — E041 Nontoxic single thyroid nodule: Secondary | ICD-10-CM

## 2016-08-01 DIAGNOSIS — E042 Nontoxic multinodular goiter: Secondary | ICD-10-CM | POA: Insufficient documentation

## 2016-10-17 ENCOUNTER — Other Ambulatory Visit: Payer: Self-pay | Admitting: Internal Medicine

## 2016-10-17 DIAGNOSIS — Z1231 Encounter for screening mammogram for malignant neoplasm of breast: Secondary | ICD-10-CM

## 2016-10-24 ENCOUNTER — Ambulatory Visit
Admission: RE | Admit: 2016-10-24 | Discharge: 2016-10-24 | Disposition: A | Discharge status: Home / Self Care | Payer: Medicare Other | Source: Ambulatory Visit | Attending: Internal Medicine | Admitting: Internal Medicine

## 2016-10-24 DIAGNOSIS — Z1231 Encounter for screening mammogram for malignant neoplasm of breast: Secondary | ICD-10-CM | POA: Insufficient documentation

## 2016-12-12 IMAGING — MG MM SCREENING BREAST TOMO BILATERAL
9 of 12 series · 9 of 28 positions shown · non-contrast
Comparison: Previous exam(s).

CLINICAL DATA: Screening. History of benign left breast excisional
biopsy.

EXAM:
DIGITAL SCREENING BILATERAL MAMMOGRAM WITH 3D TOMO WITH CAD

[L CC synth-2D]
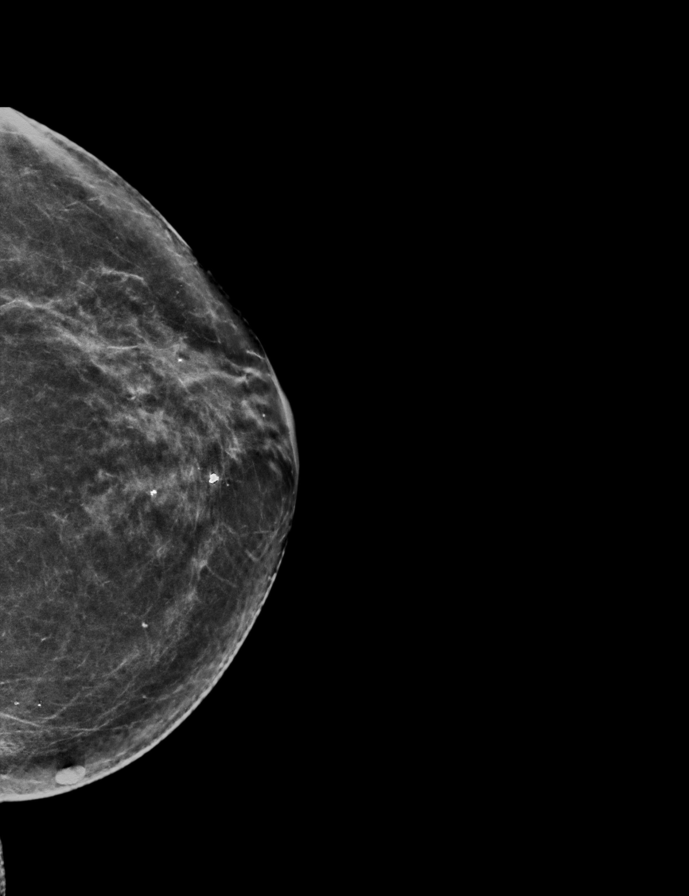

[R MLO]
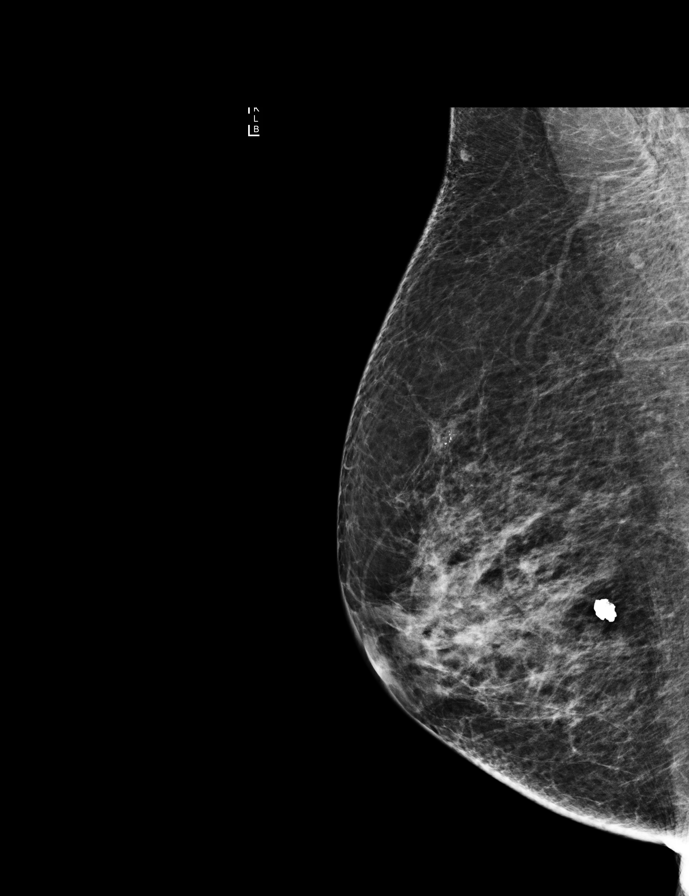

[R CC synth-2D]
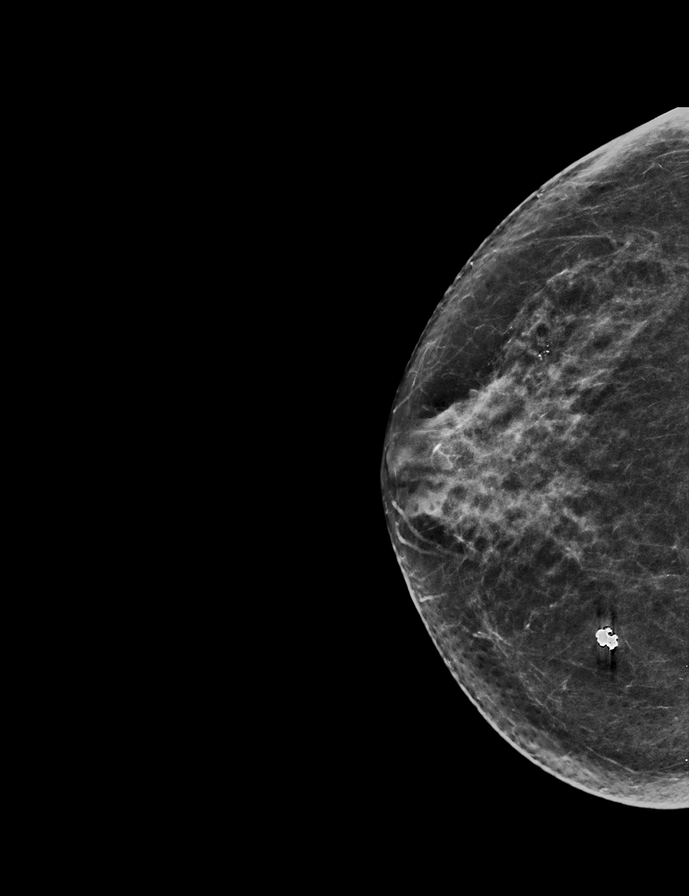

[L CC]
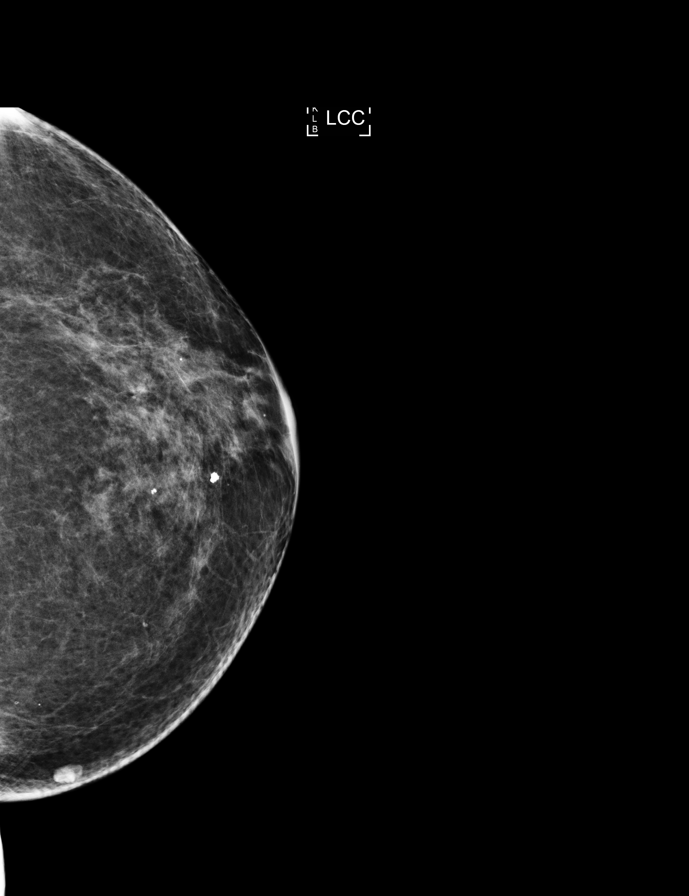

[L MLO]
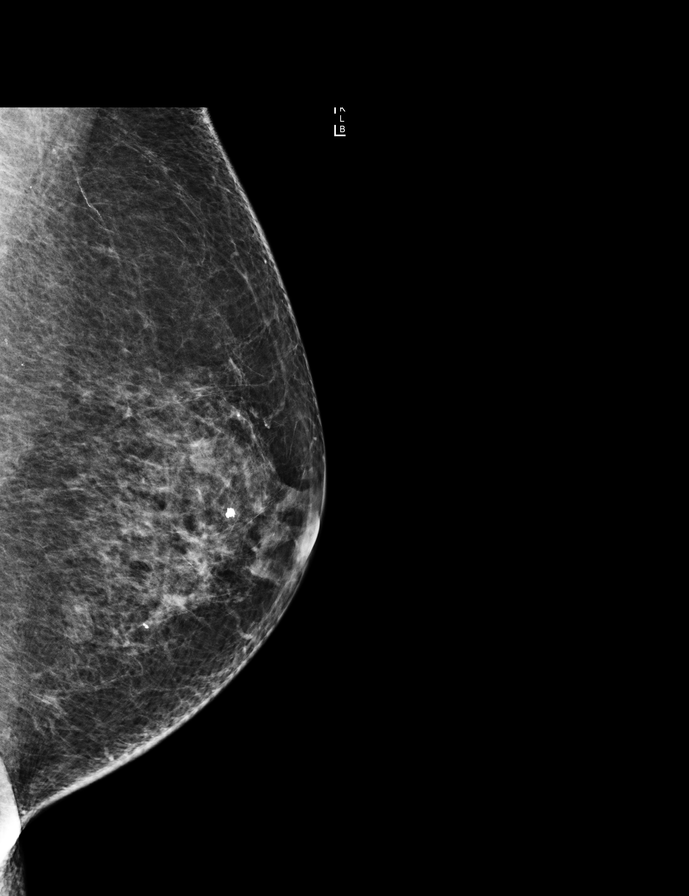

[L MLO synth-2D]
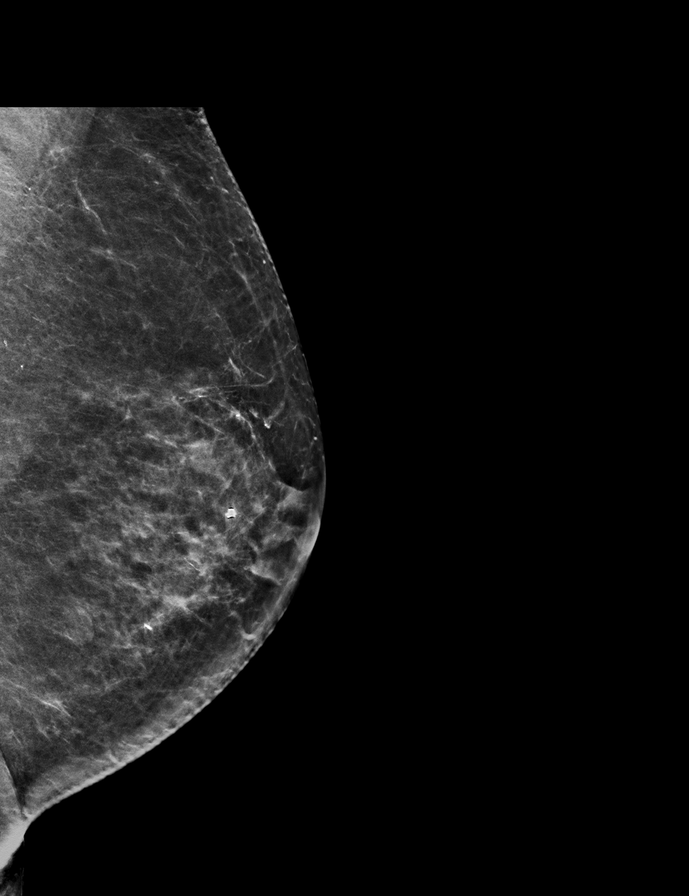

[R MLO synth-2D]
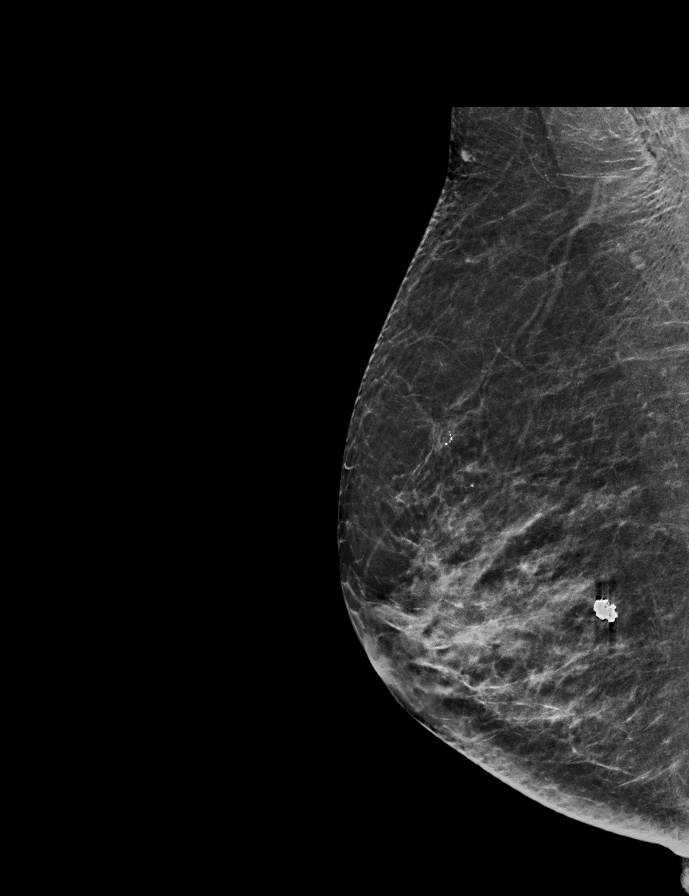

[R CC]
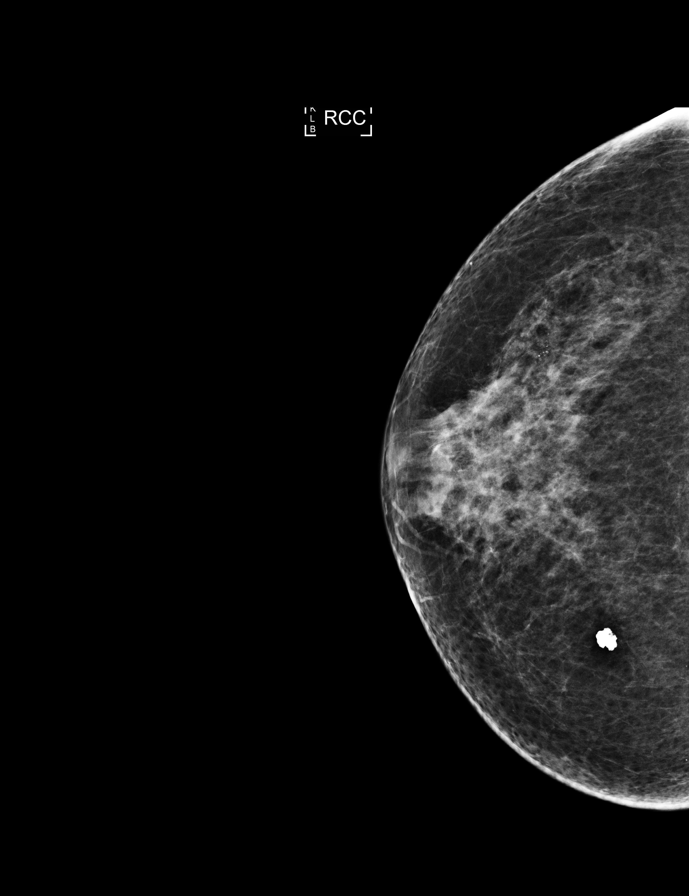

[R MLO tomo · tomo slice 33/64.0]
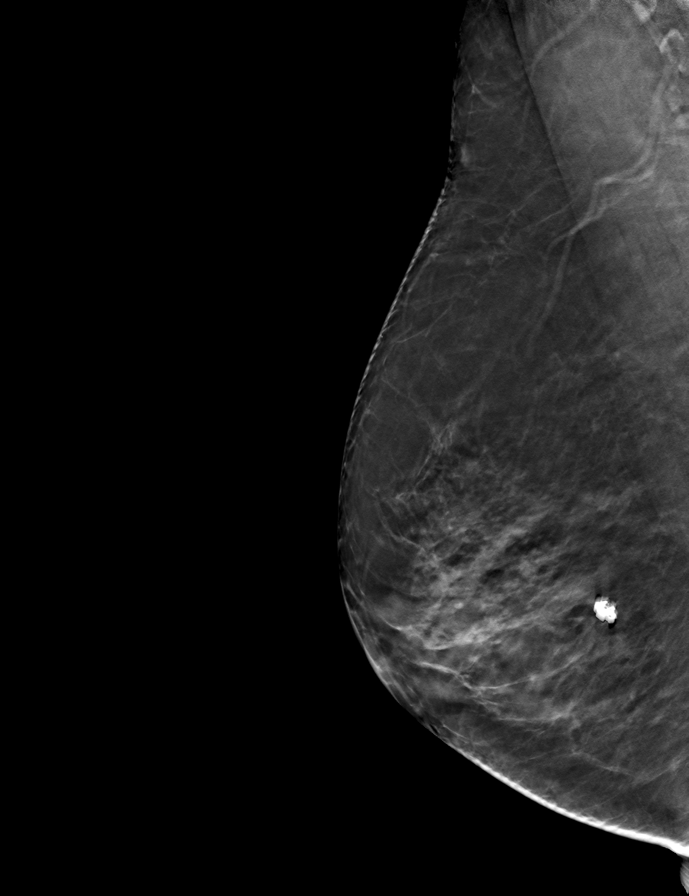

[9 of 28 positions shown; findings below may reference images not displayed]

ACR Breast Density Category b: There are scattered areas of
fibroglandular density.
FINDINGS: There are no findings suspicious for malignancy. Images were
processed with CAD.
IMPRESSION: No mammographic evidence of malignancy. A result letter of this
screening mammogram will be mailed directly to the patient.

RECOMMENDATION:
Screening mammogram in one year. (Code:3C-D-N3Z)

BI-RADS CATEGORY  1: Negative.

## 2017-01-16 NOTE — Progress Notes (Signed)
8:46 AM   DEASHA BODDY 10/05/1929 [id number]  Referring provider: Tracie Harrier, MD [address] [facility] [place], [place] [address]  Chief Complaint  Patient presents with  . Follow-up    Urethral Carnucle    HPI: Patient is an 83 year old Caucasian female who was found to have a urethral caruncle who presents today for a one year follow up.  Patient states that she has been applying the cream three nights weekly.  She did not experience any problems administering the cream or any vaginal burning with the cream. She feels the caruncle has diminished in size since she last presented to [place]. She's not had any gross hematuria or spotting in her underwear.  She has not had any gross hematuria, suprapubic pain or dysuria.  She denies any fevers, chills, nausea or vomiting.     PMH: Past Medical History:  Diagnosis Date  . Hyperlipidemia   . Melanoma ([place]) 2002   with chemo and rad tx  . Personal history of chemotherapy 2003   MELANOMA  . Personal history of radiation therapy 2002   MELANOMA  . Stomach ulcer   . Urethral caruncle     Surgical History: Past Surgical History:  Procedure Laterality Date  . BREAST EXCISIONAL BIOPSY Left 1986   EXCISIONAL - NEG  . cataract    . hemmriodectomy  1980  . melanoma  2002    Home Medications:  Allergies as of 01/17/2017      Reactions   Toprol Xl [metoprolol Tartrate]       Medication List       Accurate as of 01/17/17  8:46 AM. Always use your most recent med list.          Calcium Carbonate-Vitamin D3 600-400 MG-UNIT Tabs Take by mouth.   conjugated estrogens vaginal cream Commonly known as:  PREMARIN Place 1 Applicatorful vaginally daily. Apply 0.5mg  (pea-sized amount)  just inside the vaginal introitus with a finger-tip every night for two weeks and then Monday, Wednesday and Friday nights.   conjugated estrogens vaginal cream Commonly known as:  PREMARIN Place 1 Applicatorful vaginally  daily. Apply 0.5mg  (pea-sized amount)  just inside the vaginal introitus with a finger-tip every night for two weeks and then Monday, Wednesday and Friday nights.   estradiol 0.1 MG/GM vaginal cream Commonly known as:  ESTRACE Place 1 Applicatorful vaginally at bedtime.   estradiol 0.1 MG/GM vaginal cream Commonly known as:  ESTRACE VAGINAL Place 1 Applicatorful vaginally at bedtime.   estradiol 0.1 MG/GM vaginal cream Commonly known as:  ESTRACE VAGINAL Apply 0.5mg  (pea-sized amount)  just inside the vaginal introitus with a finger-tip every on Monday, Wednesday and Friday nights.   Fish Oil 1000 MG Caps Take by mouth.   fluticasone 50 MCG/ACT nasal spray Commonly known as:  FLONASE Place into the nose.   levothyroxine 25 MCG tablet Commonly known as:  SYNTHROID, LEVOTHROID Take 25 mcg by mouth daily before breakfast.   levothyroxine 25 MCG tablet Commonly known as:  SYNTHROID, LEVOTHROID Take by mouth.   multivitamin tablet Take 1 tablet by mouth daily.   simvastatin 20 MG tablet Commonly known as:  ZOCOR Take by mouth.       Allergies:  Allergies  Allergen Reactions  . Toprol Xl [Metoprolol Tartrate]     Family History: Family History  Problem Relation Age of Onset  . Kidney disease Neg Hx   . Bladder Cancer Neg Hx   . Prostate  cancer Neg Hx   . Breast cancer Neg Hx     Social History:  reports that she has never smoked. She has never used smokeless tobacco. She reports that she does not drink alcohol or use drugs.  ROS: UROLOGY Frequent Urination?: No Hard to postpone urination?: No Burning/pain with urination?: No Get up at night to urinate?: No Leakage of urine?: No Urine stream starts and stops?: No Trouble starting stream?: No Do you have to strain to urinate?: No Blood in urine?: No Urinary tract infection?: No Sexually transmitted disease?: No Injury to kidneys or bladder?: No Painful intercourse?: No Weak stream?: No Currently  pregnant?: No Vaginal bleeding?: No Last menstrual period?: n  Gastrointestinal Nausea?: No Vomiting?: No Indigestion/heartburn?: No Diarrhea?: No Constipation?: No  Constitutional Fever: No Night sweats?: No Weight loss?: No Fatigue?: No  Skin Skin rash/lesions?: No Itching?: No  Eyes Blurred vision?: No Double vision?: No  Ears/Nose/Throat Sore throat?: No Sinus problems?: No  Hematologic/Lymphatic Swollen glands?: No Easy bruising?: No  Cardiovascular Leg swelling?: No Chest pain?: No  Respiratory Cough?: No Shortness of breath?: No  Endocrine Excessive thirst?: No  Musculoskeletal Back pain?: No Joint pain?: No  Neurological Headaches?: No Dizziness?: No  Psychologic Depression?: No Anxiety?: No  Physical Exam: BP (!) 149/78 (BP Location: Left Arm, Patient Position: Sitting, Cuff Size: Normal)   Pulse 70   Ht 5\' 2"  (1.575 m)   Wt 112 lb 14.4 oz (51.2 kg)   BMI 20.65 kg/m   GU: No CVA tenderness. Normal external genitalia. Normal urethral meatus. Urethral caruncle is noted (2 mm in size). No bladder fullness or masses. No vaginal lesions or discharge. Normal rectal tone, no masses. Normal anus and perineum.   Laboratory Data: Lab Results  Component Value Date   WBC 8.1 09/10/2013   HGB 12.0 09/10/2013   HCT 34.9 (L) 09/10/2013   MCV 90 09/10/2013   PLT 190 09/10/2013    Lab Results  Component Value Date   CREATININE 0.64 09/10/2013    Lab Results  Component Value Date   HGBA1C 5.5 09/09/2013     Assessment & Plan:    1. Urethral caruncle:  She will continue to apply the vaginal cream 3 nights weekly. She will return in 12 months for reexamination of the urethral caruncle.   Return in about 1 year (around 01/17/2018) for exam.  Zara Council, [facility]  [facility] [address], [place], [place] [address] [phone]

## 2017-01-17 ENCOUNTER — Encounter: Payer: Self-pay | Admitting: Urology

## 2017-01-17 ENCOUNTER — Ambulatory Visit (INDEPENDENT_AMBULATORY_CARE_PROVIDER_SITE_OTHER): Payer: Medicare Other | Admitting: Urology

## 2017-01-17 VITALS — BP 149/78 | HR 70 | Ht 62.0 in | Wt 112.9 lb

## 2017-01-17 DIAGNOSIS — N362 Urethral caruncle: Secondary | ICD-10-CM | POA: Diagnosis not present

## 2017-01-17 MED ORDER — ESTROGENS, CONJUGATED 0.625 MG/GM VA CREA: 1.0000 | TOPICAL_CREAM | Freq: Every day | VAGINAL | 12 refills | Status: DC

## 2017-09-20 ENCOUNTER — Other Ambulatory Visit: Payer: Self-pay | Admitting: Internal Medicine

## 2017-10-16 ENCOUNTER — Other Ambulatory Visit: Payer: Self-pay | Admitting: Internal Medicine

## 2017-10-16 DIAGNOSIS — Z1231 Encounter for screening mammogram for malignant neoplasm of breast: Secondary | ICD-10-CM

## 2017-11-15 ENCOUNTER — Ambulatory Visit
Admission: RE | Admit: 2017-11-15 | Discharge: 2017-11-15 | Disposition: A | Discharge status: Home / Self Care | Payer: Medicare Other | Source: Ambulatory Visit | Attending: Internal Medicine | Admitting: Internal Medicine

## 2017-11-15 DIAGNOSIS — Z1231 Encounter for screening mammogram for malignant neoplasm of breast: Secondary | ICD-10-CM | POA: Diagnosis present

## 2018-01-15 NOTE — Progress Notes (Signed)
8:45 AM   Krystal Richardson [birth year] [id number]  Referring provider: Tracie Harrier, MD [address] [facility] [place], [place] [address]  Chief Complaint  Patient presents with  . Follow-up    1 yr    HPI: Patient is an 84 year old Caucasian female who was found to have a urethral caruncle who presents today for a one year follow up.  Patient states that she has been applying the cream three nights weekly.  She did not experience any problems administering the cream or any vaginal burning with the cream. She feels the caruncle has diminished in size since she last presented to [place]. She's not had any gross hematuria or spotting in her underwear.  She has not had any gross hematuria, suprapubic pain or dysuria.  She denies any fevers, chills, nausea or vomiting.      PMH: Past Medical History:  Diagnosis Date  . Hyperlipidemia   . Melanoma ([place]) 2002   with chemo and rad tx  . Personal history of chemotherapy 2003   MELANOMA  . Personal history of radiation therapy 2002   MELANOMA  . Stomach ulcer   . Urethral caruncle     Surgical History: Past Surgical History:  Procedure Laterality Date  . BREAST EXCISIONAL BIOPSY Left 1986   EXCISIONAL - NEG  . cataract    . hemmriodectomy  1980  . melanoma  2002    Home Medications:  Allergies as of 01/16/2018      Reactions   Toprol Xl [metoprolol Tartrate]       Medication List        Accurate as of 01/16/18  8:45 AM. Always use your most recent med list.          Calcium Carbonate-Vitamin D3 600-400 MG-UNIT Tabs Take by mouth.   conjugated estrogens vaginal cream Commonly known as:  PREMARIN Place 1 Applicatorful vaginally daily. Apply 0.5mg  (pea-sized amount)  just inside the vaginal introitus with a finger-tip every night for two weeks and then Monday, Wednesday and Friday nights.   Fish Oil 1000 MG Caps Take by mouth.   fluticasone 50 MCG/ACT nasal spray Commonly known as:   FLONASE Place into the nose.   levothyroxine 25 MCG tablet Commonly known as:  SYNTHROID, LEVOTHROID Take 25 mcg by mouth daily before breakfast.   levothyroxine 25 MCG tablet Commonly known as:  SYNTHROID, LEVOTHROID Take by mouth.   multivitamin tablet Take 1 tablet by mouth daily.   simvastatin 20 MG tablet Commonly known as:  ZOCOR Take by mouth.       Allergies:  Allergies  Allergen Reactions  . Toprol Xl [Metoprolol Tartrate]     Family History: Family History  Problem Relation Age of Onset  . Kidney disease Neg Hx   . Bladder Cancer Neg Hx   . Prostate cancer Neg Hx   . Breast cancer Neg Hx     Social History:  reports that  has never smoked. she has never used smokeless tobacco. She reports that she does not drink alcohol or use drugs.  ROS: UROLOGY Frequent Urination?: No Hard to postpone urination?: No Burning/pain with urination?: No Get up at night to urinate?: No Leakage of urine?: No Urine stream starts and stops?: No Trouble starting stream?: No Do you have to strain to urinate?: No Blood in urine?: No Urinary tract infection?: No Sexually transmitted disease?: No Injury to kidneys or bladder?: No Painful intercourse?: No Weak stream?: No Currently  pregnant?: No Vaginal bleeding?: No Last menstrual period?: n  Gastrointestinal Nausea?: No Vomiting?: No Indigestion/heartburn?: No Diarrhea?: No Constipation?: No  Constitutional Fever: No Night sweats?: No Weight loss?: No Fatigue?: No  Skin Skin rash/lesions?: No Itching?: No  Eyes Blurred vision?: No Double vision?: No  Ears/Nose/Throat Sore throat?: No Sinus problems?: No  Hematologic/Lymphatic Swollen glands?: No Easy bruising?: No  Cardiovascular Leg swelling?: No Chest pain?: No  Respiratory Cough?: No Shortness of breath?: No  Endocrine Excessive thirst?: No  Musculoskeletal Back pain?: No Joint pain?: No  Neurological Headaches?: No Dizziness?:  No  Psychologic Depression?: No Anxiety?: No  Physical Exam: BP 134/80   Pulse 87   Ht 5\' 4"  (1.626 m)   Wt 103 lb (46.7 kg)   BMI 17.68 kg/m   Constitutional: Well nourished. Alert and oriented, No acute distress. HEENT: [place] AT, moist mucus membranes. Trachea midline, no masses. Cardiovascular: No clubbing, cyanosis, or edema. Respiratory: Normal respiratory effort, no increased work of breathing. GI: Abdomen is soft, non tender, non distended, no abdominal masses. Liver and spleen not palpable.  No hernias appreciated.  Stool sample for occult testing is not indicated.   GU: No CVA tenderness.  No bladder fullness or masses.  Atrophic external genitalia, normal pubic hair distribution, no lesions.  Normal urethral meatus, no lesions, no prolapse, no discharge.   Urethral caruncle ( 50mm in size).  No bladder fullness, tenderness or masses. Normal vagina mucosa, good estrogen effect, no discharge, no lesions, good pelvic support, no cystocele or rectocele noted.  No cervical motion tenderness.  Uterus is freely mobile and non-fixed.  No adnexal/parametria masses or tenderness noted.  Anus and perineum are without rashes or lesions.    Skin: No rashes, bruises or suspicious lesions. Lymph: No cervical or inguinal adenopathy. Neurologic: Grossly intact, no focal deficits, moving all 4 extremities. Psychiatric: Normal mood and affect.   Laboratory Data: Lab Results  Component Value Date   WBC 8.1 09/10/2013   HGB 12.0 09/10/2013   HCT 34.9 (L) 09/10/2013   MCV 90 09/10/2013   PLT 190 09/10/2013    Lab Results  Component Value Date   CREATININE 0.64 09/10/2013    Lab Results  Component Value Date   HGBA1C 5.5 09/09/2013   I have reviewed the labs.  Assessment & Plan:    1. Urethral caruncle:  She will continue to apply the vaginal cream 3 nights weekly. She will return in 12 months for reexamination of the urethral caruncle.  Refill given to patient.    Return in  about 1 year (around 01/16/2019) for exam.  Zara Council, [facility]  [facility] [address] [place], [place] [address] [phone]

## 2018-01-16 ENCOUNTER — Ambulatory Visit (INDEPENDENT_AMBULATORY_CARE_PROVIDER_SITE_OTHER): Payer: Medicare Other | Admitting: Urology

## 2018-01-16 ENCOUNTER — Encounter: Payer: Self-pay | Admitting: Urology

## 2018-01-16 VITALS — BP 134/80 | HR 87 | Ht 64.0 in | Wt 103.0 lb

## 2018-01-16 DIAGNOSIS — N362 Urethral caruncle: Secondary | ICD-10-CM

## 2018-01-16 MED ORDER — ESTROGENS, CONJUGATED 0.625 MG/GM VA CREA: 1.0000 | TOPICAL_CREAM | Freq: Every day | VAGINAL | 12 refills | Status: DC

## 2018-01-17 ENCOUNTER — Ambulatory Visit: Payer: Medicare Other | Admitting: Urology

## 2018-12-27 ENCOUNTER — Other Ambulatory Visit: Payer: Self-pay | Admitting: Internal Medicine

## 2018-12-27 DIAGNOSIS — Z1231 Encounter for screening mammogram for malignant neoplasm of breast: Secondary | ICD-10-CM

## 2019-01-09 ENCOUNTER — Ambulatory Visit
Admission: RE | Admit: 2019-01-09 | Discharge: 2019-01-09 | Disposition: A | Discharge status: Home / Self Care | Payer: Medicare Other | Source: Ambulatory Visit | Attending: Internal Medicine | Admitting: Internal Medicine

## 2019-01-09 DIAGNOSIS — Z1231 Encounter for screening mammogram for malignant neoplasm of breast: Secondary | ICD-10-CM | POA: Diagnosis not present

## 2019-01-16 ENCOUNTER — Ambulatory Visit: Payer: Medicare Other | Admitting: Urology

## 2019-01-20 NOTE — Progress Notes (Signed)
8:43 AM   Krystal Richardson 12/19/1929 [id number]  Referring provider: Tracie Harrier, MD [address] [facility] [place], [place] [address]  Chief Complaint  Patient presents with  . Follow-up    Urethral caruncle    HPI: Patient is an 85 year old Caucasian female who was found to have a urethral caruncle who presents today for a one year follow up.  Today, she is having nocturia x 1-2.  She states it is not bothersome.  Patient denies any gross hematuria, dysuria or suprapubic/flank pain.  Patient denies any fevers, chills, nausea or vomiting.   She continues to use the vaginal estrogen cream three nights weekly.  She has not noted any changes, bleeding or discomfort from the caruncle.    PMH: Past Medical History:  Diagnosis Date  . Hyperlipidemia   . Melanoma ([place]) 2002   with chemo and rad tx  . Personal history of chemotherapy 2003   MELANOMA  . Personal history of radiation therapy 2002   MELANOMA  . Stomach ulcer   . Urethral caruncle     Surgical History: Past Surgical History:  Procedure Laterality Date  . BREAST EXCISIONAL BIOPSY Left 1986   EXCISIONAL - NEG  . cataract    . hemmriodectomy  1980  . melanoma  2002    Home Medications:  Allergies as of 01/21/2019      Reactions   Toprol Xl [metoprolol Tartrate]       Medication List       Accurate as of January 21, 2019  8:43 AM. Always use your most recent med list.        Calcium Carbonate-Vitamin D3 600-400 MG-UNIT Tabs Take by mouth.   conjugated estrogens vaginal cream Commonly known as:  PREMARIN Place 1 Applicatorful vaginally daily. Apply 0.5mg  (pea-sized amount)  just inside the vaginal introitus with a finger-tip every night for two weeks and then Monday, Wednesday and Friday nights.   Fish Oil 1000 MG Caps Take by mouth.   fluticasone 50 MCG/ACT nasal spray Commonly known as:  FLONASE Place into the nose.   levothyroxine 25 MCG tablet Commonly known as:   SYNTHROID, LEVOTHROID Take 25 mcg by mouth daily before breakfast.   levothyroxine 25 MCG tablet Commonly known as:  SYNTHROID, LEVOTHROID Take by mouth.   multivitamin tablet Take 1 tablet by mouth daily.   simvastatin 20 MG tablet Commonly known as:  ZOCOR Take by mouth.       Allergies:  Allergies  Allergen Reactions  . Toprol Xl [Metoprolol Tartrate]     Family History: Family History  Problem Relation Age of Onset  . Kidney disease Neg Hx   . Bladder Cancer Neg Hx   . Prostate cancer Neg Hx   . Breast cancer Neg Hx     Social History:  reports that she has never smoked. She has never used smokeless tobacco. She reports that she does not drink alcohol or use drugs.  ROS: UROLOGY Frequent Urination?: No Hard to postpone urination?: No Burning/pain with urination?: No Get up at night to urinate?: Yes Leakage of urine?: No Urine stream starts and stops?: No Trouble starting stream?: No Do you have to strain to urinate?: No Blood in urine?: No Urinary tract infection?: No Sexually transmitted disease?: No Injury to kidneys or bladder?: No Painful intercourse?: No Weak stream?: No Currently pregnant?: No Vaginal bleeding?: No Last menstrual period?: n  Gastrointestinal Nausea?: No Vomiting?: No Indigestion/heartburn?: No Diarrhea?: No  Constipation?: No  Constitutional Fever: No Night sweats?: No Weight loss?: No Fatigue?: No  Skin Skin rash/lesions?: No Itching?: No  Eyes Blurred vision?: No Double vision?: No  Ears/Nose/Throat Sore throat?: No Sinus problems?: No  Hematologic/Lymphatic Swollen glands?: No Easy bruising?: No  Cardiovascular Leg swelling?: No Chest pain?: No  Respiratory Cough?: No Shortness of breath?: No  Endocrine Excessive thirst?: No  Musculoskeletal Back pain?: No Joint pain?: No  Neurological Headaches?: No Dizziness?: No  Psychologic Depression?: No Anxiety?: No  Physical Exam: BP (!)  161/71   Pulse 80   Temp (!) 96.5 F (35.8 C) (Oral)   Ht 5\' 4"  (1.626 m)   Wt 110 lb (49.9 kg)   BMI 18.88 kg/m   Constitutional:  Well nourished. Alert and oriented, No acute distress. HEENT: [place] AT, moist mucus membranes.  Trachea midline, no masses. Cardiovascular: No clubbing, cyanosis, or edema. Respiratory: Normal respiratory effort, no increased work of breathing. GI: Abdomen is soft, non tender, non distended, no abdominal masses. Liver and spleen not palpable.  No hernias appreciated.  Stool sample for occult testing is not indicated.   GU: No CVA tenderness.  No bladder fullness or masses.  Atrophic external genitalia, sparse pubic hair distribution, no lesions.  Normal urethral meatus, no lesions, no prolapse, no discharge.   Urethral caruncle noted.  No bladder fullness, tenderness or masses. Pale vagina mucosa, good estrogen effect, no discharge, no lesions, good  pelvic support, no cystocele and no rectocele noted.  No cervical motion tenderness.  Uterus is freely mobile and non-fixed.  No adnexal/parametria masses or tenderness noted.  Anus and perineum are without rashes or lesions.    Skin: No rashes, bruises or suspicious lesions. Lymph: No cervical or inguinal adenopathy. Neurologic: Grossly intact, no focal deficits, moving all 4 extremities. Psychiatric: Normal mood and affect.   Laboratory Data: Lab Results  Component Value Date   WBC 8.1 09/10/2013   HGB 12.0 09/10/2013   HCT 34.9 (L) 09/10/2013   MCV 90 09/10/2013   PLT 190 09/10/2013    Lab Results  Component Value Date   CREATININE 0.64 09/10/2013    Lab Results  Component Value Date   HGBA1C 5.5 09/09/2013   I have reviewed the labs.  Assessment & Plan:    1. Urethral caruncle -continue Premarin cream tid weekly -Rx given for refill -RTC in 12 months for exam    Return in about 1 year (around 01/22/2020) for exam.  Zara Council, [facility]  [place] [address] [place], [place] [address] [phone]

## 2019-01-21 ENCOUNTER — Encounter: Payer: Self-pay | Admitting: Urology

## 2019-01-21 ENCOUNTER — Other Ambulatory Visit: Payer: Self-pay

## 2019-01-21 ENCOUNTER — Ambulatory Visit (INDEPENDENT_AMBULATORY_CARE_PROVIDER_SITE_OTHER): Payer: Medicare Other | Admitting: Urology

## 2019-01-21 VITALS — BP 161/71 | HR 80 | Temp 96.5°F | Ht 64.0 in | Wt 110.0 lb

## 2019-01-21 DIAGNOSIS — N362 Urethral caruncle: Secondary | ICD-10-CM

## 2019-01-21 MED ORDER — ESTROGENS, CONJUGATED 0.625 MG/GM VA CREA: TOPICAL_CREAM | VAGINAL | 12 refills | Status: DC

## 2019-02-20 DIAGNOSIS — C4491 Basal cell carcinoma of skin, unspecified: Secondary | ICD-10-CM

## 2019-02-20 HISTORY — DX: Basal cell carcinoma of skin, unspecified: C44.91

## 2019-03-12 DIAGNOSIS — L570 Actinic keratosis: Secondary | ICD-10-CM

## 2019-03-12 HISTORY — DX: Actinic keratosis: L57.0

## 2020-01-27 ENCOUNTER — Ambulatory Visit: Payer: Medicare Other | Admitting: Urology

## 2020-02-03 ENCOUNTER — Encounter: Payer: Self-pay | Admitting: Urology

## 2020-02-03 ENCOUNTER — Other Ambulatory Visit: Payer: Self-pay

## 2020-02-03 ENCOUNTER — Ambulatory Visit (INDEPENDENT_AMBULATORY_CARE_PROVIDER_SITE_OTHER): Payer: Medicare Other | Admitting: Urology

## 2020-02-03 VITALS — BP 168/72 | HR 78 | Ht 64.0 in | Wt 103.0 lb

## 2020-02-03 DIAGNOSIS — N362 Urethral caruncle: Secondary | ICD-10-CM

## 2020-02-03 MED ORDER — PREMARIN 0.625 MG/GM VA CREA: TOPICAL_CREAM | VAGINAL | 12 refills | Status: DC

## 2020-02-03 NOTE — Progress Notes (Signed)
10:44 AM   Krystal Richardson [birth year] [id number]  Referring provider: Tracie Harrier, MD [address] [facility] [place],  [place] [address]  Chief Complaint  Patient presents with  . Urethral Caruncle    HPI: Patient is an 86 year old female who was found to have a urethral caruncle who presents today for a one year follow up.  Today, she is having no complaints.  Patient denies any modifying or aggravating factors.  Patient denies any gross hematuria, dysuria or suprapubic/flank pain.  Patient denies any fevers, chills, nausea or vomiting.   She continues to apply the vaginal estrogen cream, Monday night, Wednesday night and Friday nights.  PMH: Past Medical History:  Diagnosis Date  . Hyperlipidemia   . Melanoma ([place]) 2002   with chemo and rad tx  . Personal history of chemotherapy 2003   MELANOMA  . Personal history of radiation therapy 2002   MELANOMA  . Stomach ulcer   . Urethral caruncle     Surgical History: Past Surgical History:  Procedure Laterality Date  . BREAST EXCISIONAL BIOPSY Left 1986   EXCISIONAL - NEG  . cataract    . hemmriodectomy  1980  . melanoma  2002    Home Medications:  Allergies as of 02/03/2020      Reactions   Toprol Xl [metoprolol Tartrate]       Medication List       Accurate as of February 03, 2020 10:44 AM. If you have any questions, ask your nurse or doctor.        Calcium Carbonate-Vitamin D3 600-400 MG-UNIT Tabs Take by mouth.   conjugated estrogens vaginal cream Commonly known as: Premarin Apply 0.5mg  (pea-sized amount)  just inside the vaginal introitus with a finger-tip every night for two weeks and then Monday, Wednesday and Friday nights.   Fish Oil 1000 MG Caps Take by mouth.   fluticasone 50 MCG/ACT nasal spray Commonly known as: FLONASE Place into the nose.   levothyroxine 25 MCG tablet Commonly known as: SYNTHROID Take 25 mcg by mouth daily before breakfast. What changed:  Another medication with the same name was removed. Continue taking this medication, and follow the directions you see here. Changed by: Zara Council, PA-C   multivitamin tablet Take 1 tablet by mouth daily.   simvastatin 20 MG tablet Commonly known as: ZOCOR Take by mouth.       Allergies:  Allergies  Allergen Reactions  . Toprol Xl [Metoprolol Tartrate]     Family History: Family History  Problem Relation Age of Onset  . Kidney disease Neg Hx   . Bladder Cancer Neg Hx   . Prostate cancer Neg Hx   . Breast cancer Neg Hx     Social History:  reports that she has never smoked. She has never used smokeless tobacco. She reports that she does not drink alcohol or use drugs.  ROS: UROLOGY Frequent Urination?: No Hard to postpone urination?: No Burning/pain with urination?: No Get up at night to urinate?: No Leakage of urine?: No Urine stream starts and stops?: No Trouble starting stream?: No Do you have to strain to urinate?: No Blood in urine?: No Urinary tract infection?: No Sexually transmitted disease?: No Injury to kidneys or bladder?: No Painful intercourse?: No Weak stream?: No Currently pregnant?: No Vaginal bleeding?: No Last menstrual period?: n  Gastrointestinal Nausea?: No Vomiting?: No Indigestion/heartburn?: No Diarrhea?: No Constipation?: No  Constitutional Fever: No Night sweats?: No Weight loss?: No  Fatigue?: No  Skin Skin rash/lesions?: No Itching?: No  Eyes Blurred vision?: No Double vision?: No  Ears/Nose/Throat Sore throat?: No Sinus problems?: No  Hematologic/Lymphatic Swollen glands?: No Easy bruising?: No  Cardiovascular Leg swelling?: No Chest pain?: No  Respiratory Cough?: No Shortness of breath?: No  Endocrine Excessive thirst?: No  Musculoskeletal Back pain?: No Joint pain?: No  Neurological Headaches?: No Dizziness?: No  Psychologic Depression?: No Anxiety?: No  Physical Exam: BP (!)  168/72   Pulse 78   Ht 5\' 4"  (1.626 m)   Wt 103 lb (46.7 kg)   BMI 17.68 kg/m   Constitutional:  Well nourished. Alert and oriented, No acute distress. HEENT: [place] AT, mask in place.  Trachea midline, no masses. Cardiovascular: No clubbing, cyanosis, or edema. Respiratory: Normal respiratory effort, no increased work of breathing. GI: Abdomen is soft, non tender, non distended, no abdominal masses. Liver and spleen not palpable.  No hernias appreciated.  Stool sample for occult testing is not indicated.   GU: No CVA tenderness.  No bladder fullness or masses.  Atrophic external genitalia, sparse pubic hair distribution, no lesions.  Small urethral caruncle.  No bladder fullness, tenderness or masses. Pale vagina mucosa, fari estrogen effect, no discharge, no lesions.   Neurologic: Grossly intact, no focal deficits, moving all 4 extremities. Psychiatric: Normal mood and affect.   Laboratory Data: Lab Results  Component Value Date   WBC 8.1 09/10/2013   HGB 12.0 09/10/2013   HCT 34.9 (L) 09/10/2013   MCV 90 09/10/2013   PLT 190 09/10/2013    Lab Results  Component Value Date   CREATININE 0.64 09/10/2013    Lab Results  Component Value Date   HGBA1C 5.5 09/09/2013   I have reviewed the labs.  Assessment & Plan:    1. Urethral caruncle Patient will continue the vaginal estrogen cream on Monday, Wednesday and Friday nights Refills given Patient return in 1 year for exam   Return in about 1 year (around 02/02/2021) for exam.  Zara Council, [facility]  [place] [address] [place], [place] [address] [phone]

## 2020-03-09 ENCOUNTER — Ambulatory Visit: Payer: Medicare Other | Admitting: Dermatology

## 2020-12-30 ENCOUNTER — Ambulatory Visit (INDEPENDENT_AMBULATORY_CARE_PROVIDER_SITE_OTHER): Payer: Medicare Other | Admitting: Podiatry

## 2020-12-30 ENCOUNTER — Other Ambulatory Visit: Payer: Self-pay

## 2020-12-30 ENCOUNTER — Encounter: Payer: Self-pay | Admitting: Podiatry

## 2020-12-30 DIAGNOSIS — L600 Ingrowing nail: Secondary | ICD-10-CM

## 2020-12-30 NOTE — Progress Notes (Signed)
Subjective:  Patient ID: Krystal Richardson, female    DOB: [birth year],  MRN: [id number] HPI Chief Complaint  Patient presents with  . Nail Problem    Patient presents today for ingrown toenail right hallux lateral border, corn right hallux and bruising of nail same toe x 5-6 days.  She says her toenail is tender with certain movements, but denies pain.    87 y.o. female presents with the above complaint.   ROS: Denies fever chills nausea vomiting muscle aches pains calf pain back pain chest pain shortness of breath.  Past Medical History:  Diagnosis Date  . Actinic keratosis   . Hyperlipidemia   . Melanoma ([facility]) 2002   with chemo and rad tx  . Personal history of chemotherapy 2003   MELANOMA  . Personal history of radiation therapy 2002   MELANOMA  . Stomach ulcer   . Urethral caruncle    Past Surgical History:  Procedure Laterality Date  . BREAST EXCISIONAL BIOPSY Left 1986   EXCISIONAL - NEG  . cataract    . hemmriodectomy  1980  . melanoma  2002    Current Outpatient Medications:  .  ipratropium (ATROVENT) 0.03 % nasal spray, , Disp: , Rfl:  .  Calcium Carb-Cholecalciferol (CALCIUM CARBONATE-VITAMIN D3) 600-400 MG-UNIT TABS, Take by mouth., Disp: , Rfl:  .  conjugated estrogens (PREMARIN) vaginal cream, Apply 0.5mg  (pea-sized amount)  just inside the vaginal introitus with a finger-tip every night for two weeks and then Monday, Wednesday and Friday nights., Disp: 30 g, Rfl: 12 .  fluticasone (FLONASE) 50 MCG/ACT nasal spray, Place into the nose., Disp: , Rfl:  .  levothyroxine (SYNTHROID, LEVOTHROID) 25 MCG tablet, Take 25 mcg by mouth daily before breakfast., Disp: , Rfl:  .  Multiple Vitamin (MULTIVITAMIN) tablet, Take 1 tablet by mouth daily., Disp: , Rfl:  .  Omega-3 Fatty Acids (FISH OIL) 1000 MG CAPS, Take by mouth., Disp: , Rfl:  .  simvastatin (ZOCOR) 20 MG tablet, Take by mouth., Disp: , Rfl:   Allergies  Allergen Reactions  . Toprol Xl [Metoprolol Tartrate]     Review of Systems Objective:  There were no vitals filed for this visit.  General: Well developed, nourished, in no acute distress, alert and oriented x3   Dermatological: Skin is warm, dry and supple bilateral. Nails x 10 are well maintained; remaining integument appears unremarkable at this time. There are no open sores, no preulcerative lesions, no rash or signs of infection present.  Hyperkeratotic lesion over the lateral nail fold of the right hallux secondary to his juxtaposition to the second toe.  No erythema cellulitis drainage or odor.  Vascular: Dorsalis Pedis artery and Posterior Tibial artery pedal pulses are 2/4 bilateral with immedate capillary fill time. Pedal hair growth present. No varicosities and no lower extremity edema present bilateral.   Neruologic: Grossly intact via light touch bilateral. Vibratory intact via tuning fork bilateral. Protective threshold with Semmes Wienstein monofilament intact to all pedal sites bilateral. Patellar and Achilles deep tendon reflexes 2+ bilateral. No Babinski or clonus noted bilateral.   Musculoskeletal: No gross boney pedal deformities bilateral. No pain, crepitus, or limitation noted with foot and ankle range of motion bilateral. Muscular strength 5/5 in all groups tested bilateral.  Gait: Unassisted, Nonantalgic.    Radiographs:  None taken  Assessment & Plan:   Assessment: Callus fibular border ingrown nail hallux right  Plan: Discussed the possible need for matrixectomy.  We placed padding this time to keep the  toe separated hopefully this will do the trick for her.     Erma Joubert T. [place], [place]

## 2021-01-29 NOTE — Progress Notes (Deleted)
8:51 AM   Krystal Richardson [birth year] [id number]  Referring provider: Tracie Harrier, [place] [facility] [place],  [place] [id number]  No chief complaint on file.  Urological history 1. Urethral caruncle - managed with vaginal estrogen cream three nights weekly  HPI: Krystal Richardson is a 87 y.o. female who presents today for a yearly follow up.    PMH: Past Medical History:  Diagnosis Date  . Actinic keratosis   . Hyperlipidemia   . Melanoma ([place]) 2002   with chemo and rad tx  . Personal history of chemotherapy 2003   MELANOMA  . Personal history of radiation therapy 2002   MELANOMA  . Stomach ulcer   . Urethral caruncle     Surgical History: Past Surgical History:  Procedure Laterality Date  . BREAST EXCISIONAL BIOPSY Left 1986   EXCISIONAL - NEG  . cataract    . hemmriodectomy  1980  . melanoma  2002    Home Medications:  Allergies as of 02/01/2021      Reactions   Toprol Xl [metoprolol Tartrate]       Medication List       Accurate as of January 29, 2021  8:51 AM. If you have any questions, ask your nurse or doctor.        Calcium Carbonate-Vitamin D3 600-400 MG-UNIT Tabs Take by mouth.   Fish Oil 1000 MG Caps Take by mouth.   fluticasone 50 MCG/ACT nasal spray Commonly known as: FLONASE Place into the nose.   ipratropium 0.03 % nasal spray Commonly known as: ATROVENT   levothyroxine 25 MCG tablet Commonly known as: SYNTHROID Take 25 mcg by mouth daily before breakfast.   multivitamin tablet Take 1 tablet by mouth daily.   Premarin vaginal cream Generic drug: conjugated estrogens Apply 0.89m (pea-sized amount)  just inside the vaginal introitus with a finger-tip every night for two weeks and then Monday, Wednesday and Friday nights.   simvastatin 20 MG tablet Commonly known as: ZOCOR Take by mouth.       Allergies:  Allergies  Allergen Reactions  . Toprol Xl [Metoprolol Tartrate]     Family  History: Family History  Problem Relation Age of Onset  . Kidney disease Neg Hx   . Bladder Cancer Neg Hx   . Prostate cancer Neg Hx   . Breast cancer Neg Hx     Social History:  reports that she has never smoked. She has never used smokeless tobacco. She reports that she does not drink alcohol and does not use drugs.  For pertinent review of systems please refer to history of present illness  Physical Exam: There were no vitals taken for this visit.  Constitutional:  Well nourished. Alert and oriented, No acute distress. HEENT: [place] AT, moist mucus membranes.  Trachea midline, no masses. Cardiovascular: No clubbing, cyanosis, or edema. Respiratory: Normal respiratory effort, no increased work of breathing. GI: Abdomen is soft, non tender, non distended, no abdominal masses. Liver and spleen not palpable.  No hernias appreciated.  Stool sample for occult testing is not indicated.   GU: No CVA tenderness.  No bladder fullness or masses.  *** external genitalia, *** pubic hair distribution, no lesions.  Normal urethral meatus, no lesions, no prolapse, no discharge.   No urethral masses, tenderness and/or tenderness. No bladder fullness, tenderness or masses. *** vagina mucosa, *** estrogen effect, no discharge, no lesions, *** pelvic support, *** cystocele and *** rectocele noted.  No  cervical motion tenderness.  Uterus is freely mobile and non-fixed.  No adnexal/parametria masses or tenderness noted.  Anus and perineum are without rashes or lesions.   ***  Skin: No rashes, bruises or suspicious lesions. Lymph: No cervical or inguinal adenopathy. Neurologic: Grossly intact, no focal deficits, moving all 4 extremities. Psychiatric: Normal mood and affect.   Laboratory Data: Specimen:  Blood  Ref Range & Units 3 mo ago  Glucose 70 - 110 mg/dL 96   Sodium 136 - 145 mmol/L 136   Potassium 3.6 - 5.1 mmol/L 4.8   Chloride 97 - 109 mmol/L 103   Carbon Dioxide (CO2) 22.0 - 32.0 mmol/L 27.0    Urea Nitrogen (BUN) 7 - 25 mg/dL 13   Creatinine 0.6 - 1.1 mg/dL 0.8   Glomerular Filtration Rate (eGFR), MDRD Estimate >60 mL/min/1.73sq m 68   Calcium 8.7 - 10.3 mg/dL 9.8   AST  8 - 39 U/L 24   ALT  5 - 38 U/L 18   Alk Phos (alkaline Phosphatase) 34 - 104 U/L 74   Albumin 3.5 - 4.8 g/dL 4.5   Bilirubin, Total 0.3 - 1.2 mg/dL 0.6   Protein, Total 6.1 - 7.9 g/dL 7.0   A/G Ratio 1.0 - 5.0 gm/dL 1.8   Resulting Agency  [facility]  Specimen Collected: 10/16/20 9:00 AM Last Resulted: 10/16/20 11:21 AM  Received From: [place]  Result Received: 12/28/20 9:04 AM   Specimen:  Urine  Ref Range & Units 3 mo ago  Color Yellow Yellow   Clarity Clear Clear   Specific Gravity 1.000 - 1.030 1.015   pH, Urine 5.0 - 8.0 6.5   Protein, Urinalysis Negative, Trace mg/dL Negative   Glucose, Urinalysis Negative mg/dL Negative   Ketones, Urinalysis Negative mg/dL Negative   Blood, Urinalysis Negative Negative   Nitrite, Urinalysis Negative Negative   Leukocyte Esterase, Urinalysis Negative Negative   White Blood Cells, Urinalysis None Seen, 0-3 /hpf 0-3   Red Blood Cells, Urinalysis None Seen, 0-3 /hpf 0-3   Bacteria, Urinalysis None Seen /hpf RareAbnormal   Squamous Epithelial Cells, Urinalysis Rare, Few, None Seen /hpf Rare   Resulting Agency  [facility] - LAB  Specimen Collected: 10/16/20 7:45 AM Last Resulted: 10/16/20 10:44 AM  Received From: [place]  Result Received: 12/28/20 9:04 AM   Specimen:  Blood  Ref Range & Units 3 mo ago  WBC (White Blood Cell Count) 4.1 - 10.2 10^3/uL 6.7   RBC (Red Blood Cell Count) 4.04 - 5.48 10^6/uL 4.15   Hemoglobin 12.0 - 15.0 gm/dL 12.6   Hematocrit 35.0 - 47.0 % 37.8   MCV (Mean Corpuscular Volume) 80.0 - 100.0 fl 91.1   MCH (Mean Corpuscular Hemoglobin) 27.0 - 31.2 pg 30.4   MCHC (Mean Corpuscular Hemoglobin Concentration) 32.0 - 36.0 gm/dL 33.3   Platelet Count 150 - 450  10^3/uL 208   RDW-CV (Red Cell Distribution Width) 11.6 - 14.8 % 14.6   MPV (Mean Platelet Volume) 9.4 - 12.4 fl 10.4   Neutrophils 1.50 - 7.80 10^3/uL 3.34   Lymphocytes 1.00 - 3.60 10^3/uL 2.41   Monocytes 0.00 - 1.50 10^3/uL 0.74   Eosinophils 0.00 - 0.55 10^3/uL 0.11   Basophils 0.00 - 0.09 10^3/uL 0.05   Neutrophil % 32.0 - 70.0 % 50.2   Lymphocyte % 10.0 - 50.0 % 36.1   Monocyte % 4.0 - 13.0 % 11.1   Eosinophil % 1.0 - 5.0 %  1.6   Basophil% 0.0 - 2.0 % 0.7   Immature Granulocyte % <=0.7 % 0.3   Immature Granulocyte Count <=0.06 10^3/L 0.02   Resulting Agency  [place] - LAB  Specimen Collected: 10/16/20 7:16 AM Last Resulted: 10/16/20 8:34 AM  Received From: [place]  Result Received: 12/28/20 9:04 AM    I have reviewed the labs.  Assessment & Plan:    1. Urethral caruncle Patient will continue the vaginal estrogen cream on Monday, Wednesday and Friday nights Refills given Patient return in 1 year for exam   No follow-ups on file.  Zara Council, PA-C  [facility] [address] [place], [place] [address] [phone]

## 2021-02-01 ENCOUNTER — Ambulatory Visit: Payer: Medicare Other | Admitting: Urology

## 2021-02-01 DIAGNOSIS — N362 Urethral caruncle: Secondary | ICD-10-CM

## 2021-04-06 ENCOUNTER — Ambulatory Visit (INDEPENDENT_AMBULATORY_CARE_PROVIDER_SITE_OTHER): Payer: Medicare Other | Admitting: Dermatology

## 2021-04-06 ENCOUNTER — Encounter: Payer: Self-pay | Admitting: Dermatology

## 2021-04-06 ENCOUNTER — Other Ambulatory Visit: Payer: Self-pay

## 2021-04-06 DIAGNOSIS — L570 Actinic keratosis: Secondary | ICD-10-CM

## 2021-04-06 DIAGNOSIS — L578 Other skin changes due to chronic exposure to nonionizing radiation: Secondary | ICD-10-CM | POA: Diagnosis not present

## 2021-04-06 DIAGNOSIS — L820 Inflamed seborrheic keratosis: Secondary | ICD-10-CM | POA: Diagnosis not present

## 2021-04-06 NOTE — Progress Notes (Signed)
   Follow-Up Visit   Subjective  Krystal Richardson is a 87 y.o. female who presents for the following: check spot (R nose, ~75yr, pt putting polysporin on it/R med leg, ~89yr, putting polysporin on it).  The following portions of the chart were reviewed this encounter and updated as appropriate:   Tobacco  Allergies  Meds  Problems  Med Hx  Surg Hx  Fam Hx     Review of Systems:  No other skin or systemic complaints except as noted in HPI or Assessment and Plan.  Objective  Well appearing patient in no apparent distress; mood and affect are within normal limits.  A focused examination was performed including nose, L lower leg. Relevant physical exam findings are noted in the Assessment and Plan.  Objective  R nose x 1, L nose x 1 (2): Pink scaly macules   Objective  Left Lower Leg x 1: Erythematous keratotic or waxy stuck-on papule or plaque.    Assessment & Plan    Actinic Damage - chronic, secondary to cumulative UV radiation exposure/sun exposure over time - diffuse scaly erythematous macules with underlying dyspigmentation - Recommend daily broad spectrum sunscreen SPF 30+ to sun-exposed areas, reapply every 2 hours as needed.  - Recommend staying in the shade or wearing long sleeves, sun glasses (UVA+UVB protection) and wide brim hats (4-inch brim around the entire circumference of the hat). - Call for new or changing lesions.  AK (actinic keratosis) (2) R nose x 1, L nose x 1  Destruction of lesion - R nose x 1, L nose x 1 Complexity: simple   Destruction method: cryotherapy   Informed consent: discussed and consent obtained   Timeout:  patient name, date of birth, surgical site, and procedure verified Lesion destroyed using liquid nitrogen: Yes   Region frozen until ice ball extended beyond lesion: Yes   Outcome: patient tolerated procedure well with no complications   Post-procedure details: wound care instructions given    Inflamed seborrheic keratosis Left  Lower Leg x 1  Destruction of lesion - Left Lower Leg x 1 Complexity: simple   Destruction method: cryotherapy   Informed consent: discussed and consent obtained   Timeout:  patient name, date of birth, surgical site, and procedure verified Lesion destroyed using liquid nitrogen: Yes   Region frozen until ice ball extended beyond lesion: Yes   Outcome: patient tolerated procedure well with no complications   Post-procedure details: wound care instructions given    Return in about 4 months (around 08/06/2021) for TBSE.  I, Othelia Pulling, RMA, am acting as scribe for Sarina Ser, MD .  Documentation: I have reviewed the above documentation for accuracy and completeness, and I agree with the above.  Sarina Ser, MD

## 2021-04-06 NOTE — Patient Instructions (Addendum)
If you have any questions or concerns for your doctor, please call our main line at [phone] and press option 4 to reach your doctor's medical assistant. If no one answers, please leave a voicemail as directed and we will return your call as soon as possible. Messages left after 4 pm will be answered the following business day.   You may also send [place] a message via [facility]. We typically respond to MyChart messages within 1-2 business days.  For prescription refills, please ask your pharmacy to contact our office. Our fax number is [phone].  If you have an urgent issue when the clinic is closed that cannot wait until the next business day, you can page your doctor at the number below.    Please note that while we do our best to be available for urgent issues outside of office hours, we are not available 24/7.   If you have an urgent issue and are unable to reach [place], you may choose to seek medical care at your doctor's office, retail clinic, urgent care center, or emergency room.  If you have a medical emergency, please immediately call 911 or go to the emergency department.  Pager Numbers  - Dr. Nehemiah Massed: [phone]  - Dr. Laurence Ferrari: [phone]  - Dr. Nicole Kindred: [phone]  In the event of inclement weather, please call our main line at [phone] for an update on the status of any delays or closures.  Dermatology Medication Tips: Please keep the boxes that topical medications come in in order to help keep track of the instructions about where and how to use these. Pharmacies typically print the medication instructions only on the boxes and not directly on the medication tubes.   If your medication is too expensive, please contact our office at [phone] option 4 or send [place] a message through [facility].   We are unable to tell what your co-pay for medications will be in advance as this is different depending on your insurance coverage. However, we may be able to find a substitute  medication at lower cost or fill out paperwork to get insurance to cover a needed medication.   If a prior authorization is required to get your medication covered by your insurance company, please allow [place] 1-2 business days to complete this process.  Drug prices often vary depending on where the prescription is filled and some pharmacies may offer cheaper prices.  The website www.goodrx.com contains coupons for medications through different pharmacies. The prices here do not account for what the cost may be with help from insurance (it may be cheaper with your insurance), but the website can give you the price if you did not use any insurance.  - You can print the associated coupon and take it with your prescription to the pharmacy.  - You may also stop by our office during regular business hours and pick up a GoodRx coupon card.  - If you need your prescription sent electronically to a different pharmacy, notify our office through [facility] or by phone at [phone] option 4.   Cryotherapy Aftercare  . Wash gently with soap and water everyday.   Marland Kitchen Apply Vaseline and Band-Aid daily until healed.   Actinic Keratosis  What is an actinic keratosis? An actinic keratosis (plural: actinic keratoses) is growth on the surface of the skin that usually appears as a red, hard, crusty or scaly bump.  What causes actinic keratoses? Repeated prolonged sun exposure causes skin damage, especially in fair-skinned persons. Sun-damaged skin  becomes dry and wrinkled and may form rough, scaly spots called actinic keratoses. These rough spots remain on the skin even though the crust or scale on top is picked off.  Why treat actinic keratoses? Actinic keratoses are not skin cancers, but because they may sometimes turn cancerous they are called "pre-cancerous". Not all will turn to skin cancer, and it usually takes several years for this to happen. Because it is much easier to treat an actinic  keratosis then it is to remove a skin cancer, actinic keratoses should be treated to prevent future skin cancer.  How are actinic keratoses treated? The most common way of treating actinic keratoses is to freeze them with liquid nitrogen. Freezing causes scabbing and shedding of the sun-damaged skin. Healing after a removal usually takes two weeks, depending on the size and location of the keratosis. Hands and legs heal more slowly than the face. The skin's final appearance is usually excellent. There are several topical medications that can be used to treat actinic keratoses. These medications generally have side effects of redness, crusting, and pain.Some are used for a few days, and some for several months before the actinic keratosis is completely gone. Photodynamic therapy is another alternative to freezing actinic keratoses.This treatment is done in a physician's office.A medication is applied to the area of skin with actinic keratoses, and it is allowed to soak in for one or more hours. A special light is then applied to the skin.Side effects include redness, burning, and peeling.  How can you prevent actinic keratoses? Protection from the sun is the best way to prevent actinic keratoses.The use of proper clothing and sunscreens can prevent the sun damage that leads to an actinic keratosis. Unfortunately, some sun damage is permanent. Once sun damage has progressed to the point where actinic keratoses develop, new keratoses may appear even without further sun exposure. However, even in skin that is already heavily sun damaged, good sun protection can help reduce the number of actinic keratoses that will appear.    Seborrheic Keratosis  What causes seborrheic keratoses? Seborrheic keratoses are harmless, common skin growths that first appear during adult life.  As time goes by, more growths appear.  Some people may develop a large number of them.  Seborrheic keratoses appear on both covered  and uncovered body parts.  They are not caused by sunlight.  The tendency to develop seborrheic keratoses can be inherited.  They vary in color from skin-colored to gray, brown, or even black.  They can be either smooth or have a rough, warty surface.   Seborrheic keratoses are superficial and look as if they were stuck on the skin.  Under the microscope this type of keratosis looks like layers upon layers of skin.  That is why at times the top layer may seem to fall off, but the rest of the growth remains and re-grows.    Treatment Seborrheic keratoses do not need to be treated, but can easily be removed in the office.  Seborrheic keratoses often cause symptoms when they rub on clothing or jewelry.  Lesions can be in the way of shaving.  If they become inflamed, they can cause itching, soreness, or burning.  Removal of a seborrheic keratosis can be accomplished by freezing, burning, or surgery. If any spot bleeds, scabs, or grows rapidly, please return to have it checked, as these can be an indication of a skin cancer.

## 2021-07-18 ENCOUNTER — Emergency Department: Payer: Medicare Other

## 2021-07-18 ENCOUNTER — Encounter: Payer: Self-pay | Admitting: Emergency Medicine

## 2021-07-18 ENCOUNTER — Emergency Department
Admission: EM | Admit: 2021-07-18 | Discharge: 2021-07-19 | Disposition: A | Discharge status: Home / Self Care | Payer: Medicare Other | Source: Home / Self Care | Attending: Emergency Medicine | Admitting: Emergency Medicine

## 2021-07-18 ENCOUNTER — Other Ambulatory Visit: Payer: Self-pay

## 2021-07-18 DIAGNOSIS — R110 Nausea: Secondary | ICD-10-CM | POA: Insufficient documentation

## 2021-07-18 DIAGNOSIS — R630 Anorexia: Secondary | ICD-10-CM | POA: Insufficient documentation

## 2021-07-18 DIAGNOSIS — R1010 Upper abdominal pain, unspecified: Secondary | ICD-10-CM | POA: Insufficient documentation

## 2021-07-18 DIAGNOSIS — R0602 Shortness of breath: Secondary | ICD-10-CM | POA: Insufficient documentation

## 2021-07-18 DIAGNOSIS — Z85828 Personal history of other malignant neoplasm of skin: Secondary | ICD-10-CM | POA: Insufficient documentation

## 2021-07-18 DIAGNOSIS — R627 Adult failure to thrive: Secondary | ICD-10-CM | POA: Diagnosis not present

## 2021-07-18 DIAGNOSIS — R198 Other specified symptoms and signs involving the digestive system and abdomen: Secondary | ICD-10-CM | POA: Insufficient documentation

## 2021-07-18 DIAGNOSIS — E871 Hypo-osmolality and hyponatremia: Secondary | ICD-10-CM | POA: Diagnosis not present

## 2021-07-18 LAB — CBC
HCT: 33.3 % — ABNORMAL LOW (ref 36.0–46.0)
Hemoglobin: 11.9 g/dL — ABNORMAL LOW (ref 12.0–15.0)
MCH: 30.7 pg (ref 26.0–34.0)
MCHC: 35.7 g/dL (ref 30.0–36.0)
MCV: 86 fL (ref 80.0–100.0)
Platelets: 195 10*3/uL (ref 150–400)
RBC: 3.87 MIL/uL (ref 3.87–5.11)
RDW: 14.6 % (ref 11.5–15.5)
WBC: 7.4 10*3/uL (ref 4.0–10.5)
nRBC: 0 % (ref 0.0–0.2)

## 2021-07-18 LAB — COMPREHENSIVE METABOLIC PANEL
ALT: 18 U/L (ref 0–44)
AST: 23 U/L (ref 15–41)
Albumin: 4 g/dL (ref 3.5–5.0)
Alkaline Phosphatase: 153 U/L — ABNORMAL HIGH (ref 38–126)
Anion gap: 10 (ref 5–15)
BUN: 15 mg/dL (ref 8–23)
CO2: 20 mmol/L — ABNORMAL LOW (ref 22–32)
Calcium: 9.1 mg/dL (ref 8.9–10.3)
Chloride: 99 mmol/L (ref 98–111)
Creatinine, Ser: 0.57 mg/dL (ref 0.44–1.00)
GFR, Estimated: >60 mL/min (ref >60)
Glucose, Bld: 121 mg/dL — ABNORMAL HIGH (ref 70–99)
Potassium: 3.6 mmol/L (ref 3.5–5.1)
Sodium: 129 mmol/L — ABNORMAL LOW (ref 135–145)
Total Bilirubin: 1.2 mg/dL (ref 0.3–1.2)
Total Protein: 6.8 g/dL (ref 6.5–8.1)

## 2021-07-18 LAB — URINALYSIS, COMPLETE (UACMP) WITH MICROSCOPIC
Bacteria, UA: NONE SEEN
Bilirubin Urine: NEGATIVE
Glucose, UA: 150 mg/dL — AB
Hgb urine dipstick: NEGATIVE
Ketones, ur: 80 mg/dL — AB
Leukocytes,Ua: NEGATIVE
Nitrite: NEGATIVE
Protein, ur: NEGATIVE mg/dL
Specific Gravity, Urine: 1.011 (ref 1.005–1.030)
Squamous Epithelial / HPF: NONE SEEN (ref 0–5)
pH: 7 (ref 5.0–8.0)

## 2021-07-18 LAB — LIPASE, BLOOD: Lipase: 48 U/L (ref 11–51)

## 2021-07-18 LAB — TROPONIN I (HIGH SENSITIVITY): Troponin I (High Sensitivity): 7 ng/L (ref <18)

## 2021-07-18 MED ORDER — ONDANSETRON 4 MG PO TBDP
ORAL_TABLET | ORAL | Status: AC
  Filled 2021-07-18: qty 1

## 2021-07-18 MED ORDER — ONDANSETRON 4 MG PO TBDP
4.0000 mg | ORAL_TABLET | Freq: Once | ORAL | Status: AC
  Administered 2021-07-18: 4 mg via ORAL
  Filled 2021-07-18: qty 1

## 2021-07-18 MED ORDER — SODIUM CHLORIDE 0.9 % IV BOLUS
500.0000 mL | Freq: Once | INTRAVENOUS | Status: AC
  Administered 2021-07-18: 500 mL via INTRAVENOUS

## 2021-07-18 MED ORDER — ONDANSETRON HCL 4 MG PO TABS: 4.0000 mg | ORAL_TABLET | Freq: Three times a day (TID) | ORAL | 0 refills | Status: DC | PRN

## 2021-07-18 MED ORDER — IPRATROPIUM-ALBUTEROL 0.5-2.5 (3) MG/3ML IN SOLN
3.0000 mL | Freq: Once | RESPIRATORY_TRACT | Status: AC
  Administered 2021-07-18: 3 mL via RESPIRATORY_TRACT
  Filled 2021-07-18: qty 3

## 2021-07-18 MED ORDER — LORAZEPAM 2 MG/ML IJ SOLN
0.2500 mg | Freq: Once | INTRAMUSCULAR | Status: AC
  Administered 2021-07-18: 0.25 mg via INTRAVENOUS
  Filled 2021-07-18: qty 1

## 2021-07-18 MED ORDER — ONDANSETRON 4 MG PO TBDP
4.0000 mg | ORAL_TABLET | Freq: Once | ORAL | Status: AC
  Administered 2021-07-18: 4 mg via ORAL

## 2021-07-18 NOTE — Discharge Instructions (Addendum)
Please talk to Dr. Ginette Pitman if the prescribed medication is not helping and he can prescribe another medication for nausea. Please seek medical attention for any high fevers, chest pain, shortness of breath, change in behavior, persistent vomiting, bloody stool or any other new or concerning symptoms.

## 2021-07-18 NOTE — ED Notes (Signed)
Son up to desk, states he is very upset his mother has not been seen yet. Explanation of wait and treatment started provided to son, who apologizes for yelling and his behavior.

## 2021-07-18 NOTE — ED Notes (Signed)
PO challenge started per MD verbal order. Will reassess.

## 2021-07-18 NOTE — ED Provider Notes (Signed)
[facility] Emergency Department Provider Note   ____________________________________________   I have reviewed the triage vital signs and the nursing notes.   HISTORY  Chief Complaint Shortness of Breath and Nausea   History limited by: Not Limited   HPI Krystal Richardson is a 87 y.o. female who presents to the emergency department today because of concern for both shortness of breath and nausea. Family believes that the patient has been having some issues with shortness of breath for a little while although it got worse roughly 2 weeks ago when the patient's husband of 72 years passed away. It was also at this time when the patient's nausea started. She states she has had significantly decreased appetite and is eating very little. Has had some dry heaves. Has had some associated upper abdominal pain. Denies any fevers.    Records reviewed. Per medical record review patient has a history of stomach ulcer.   Past Medical History:  Diagnosis Date   Actinic keratosis 03/12/2019   mid post scalp melanoma graft scar   Basal cell carcinoma 08/04/2014   R forehead - excision 08/19/2014   BCC (basal cell carcinoma of skin) 02/20/2019   L dorsum foot   Hyperlipidemia    Melanoma ([place]) 2002   with chemo and rad tx   Personal history of chemotherapy 2003   MELANOMA   Personal history of radiation therapy 2002   MELANOMA   Stomach ulcer    Urethral caruncle     Patient Active Problem List   Diagnosis Date Noted   Multiple thyroid nodules 09/09/2015   Urethral caruncle 06/28/2015   Osteoporosis, post-menopausal 06/25/2015   Melanoma of scalp ([place]) 06/25/2015   HLD (hyperlipidemia) 06/25/2015    Past Surgical History:  Procedure Laterality Date   BREAST EXCISIONAL BIOPSY Left 1986   EXCISIONAL - NEG   cataract     hemmriodectomy  1980   melanoma  2002    Prior to Admission medications   Medication Sig Start Date End Date Taking? Authorizing Provider   Calcium Carb-Cholecalciferol (CALCIUM CARBONATE-VITAMIN D3) 600-400 MG-UNIT TABS Take by mouth.    [provider]  conjugated estrogens (PREMARIN) vaginal cream Apply 0.38m (pea-sized amount)  just inside the vaginal introitus with a finger-tip every night for two weeks and then Monday, Wednesday and Friday nights. 02/03/20   MZara CouncilA, PA-C  fluticasone (FLONASE) 50 MCG/ACT nasal spray Place into the nose.    [provider]  ipratropium (ATROVENT) 0.03 % nasal spray  02/20/20   [provider]  levothyroxine (SYNTHROID, LEVOTHROID) 25 MCG tablet Take 25 mcg by mouth daily before breakfast.    [provider]  Multiple Vitamin (MULTIVITAMIN) tablet Take 1 tablet by mouth daily.    [provider]  Omega-3 Fatty Acids (FISH OIL) 1000 MG CAPS Take by mouth.    [provider]  simvastatin (ZOCOR) 20 MG tablet Take by mouth. 03/30/15   [provider]    Allergies Toprol xl [metoprolol tartrate]  Family History  Problem Relation Age of Onset   Kidney disease Neg Hx    Bladder Cancer Neg Hx    Prostate cancer Neg Hx    Breast cancer Neg Hx     Social History Social History   Tobacco Use   Smoking status: Never   Smokeless tobacco: Never  Substance Use Topics   Alcohol use: No    Alcohol/week: 0.0 standard drinks   Drug use: No    Review  of Systems Constitutional: No fever/chills Eyes: No visual changes. ENT: No sore throat. Cardiovascular: Denies chest pain. Respiratory: Positive shortness of breath. Gastrointestinal: Positive for abdominal pain, nausea, decreased appetite.  Genitourinary: Negative for dysuria. Musculoskeletal: Negative for back pain. Skin: Negative for rash. Neurological: Negative for headaches, focal weakness or numbness.  ____________________________________________   PHYSICAL EXAM:  VITAL SIGNS: ED Triage Vitals  Enc Vitals Group     BP 07/18/21 1437 (!) 164/83     Pulse Rate  07/18/21 1437 79     Resp 07/18/21 1437 20     Temp 07/18/21 1437 98.6 F (37 C)     Temp Source 07/18/21 1437 Oral     SpO2 07/18/21 1437 100 %     Weight 07/18/21 1438 102 lb 15.3 oz (46.7 kg)     Height 07/18/21 1438 '5\' 3"'  (1.6 m)    Constitutional: Alert and oriented.  Eyes: Conjunctivae are normal.  ENT      Head: Normocephalic and atraumatic.      Nose: No congestion/rhinnorhea.      Mouth/Throat: Mucous membranes are moist.      Neck: No stridor. Hematological/Lymphatic/Immunilogical: No cervical lymphadenopathy. Cardiovascular: Normal rate, regular rhythm.  No murmurs, rubs, or gallops.  Respiratory: Normal respiratory effort without tachypnea nor retractions. Breath sounds are clear and equal bilaterally. No wheezes/rales/rhonchi. Gastrointestinal: Soft and non tender. No rebound. No guarding.  Genitourinary: Deferred Musculoskeletal: Normal range of motion in all extremities. No lower extremity edema. Neurologic:  Normal speech and language. No gross focal neurologic deficits are appreciated.  Skin:  Skin is warm, dry and intact. No rash noted. Psychiatric: Mood and affect are normal. Speech and behavior are normal. Patient exhibits appropriate insight and judgment.  ____________________________________________    LABS (pertinent positives/negatives)  Trop hs 7 Lipase 48 CBC wbc 7.4, hgb 11.9, plt 195 CMP na 129, co2 20, glu 121, alk phos 153  ____________________________________________   EKG  I, Nance Pear, attending physician, personally viewed and interpreted this EKG  EKG Time: 1728 Rate: 69 Rhythm: sinus rhythm Axis: normal Intervals: qtc 456 QRS: narrow ST changes: no st elevation Impression: normal ekg ____________________________________________    RADIOLOGY  CXR Hyperinflation without acute  process   ____________________________________________   PROCEDURES  Procedures  ____________________________________________   INITIAL IMPRESSION / ASSESSMENT AND PLAN / ED COURSE  Pertinent labs & imaging results that were available during my care of the patient were reviewed by me and considered in my medical decision making (see chart for details).   Patient presented to the emergency department today because of concerns for some increased shortness of breath and nausea and decreased appetite.  Does seem that both symptoms have gotten worse since the passing of her husband roughly 2 weeks ago.  Blood work here without any concerning leukocytosis.  Did have slight hyponatremia which could be due to poor oral intake.  Abdomen was soft.  At this time I doubt significant intra-abdominal condition.  Additionally troponin was negative and chest x-ray was without acute abnormality.  Did discuss possibility of grief reaction with the patient and family.  Will try IV fluids, Ativan for the nausea and we will try DuoNeb to see if that helps with her shortness of breath, however patient does not appear short of breath and had no wheezing on exam. Will trial PO. If patient feels better and is able to tolerate PO do think it would reasonable to discharge home.  ____________________________________________   FINAL CLINICAL IMPRESSION(S) / ED DIAGNOSES  Final diagnoses:  Nausea  Shortness of breath     Note: This dictation was prepared with Dragon dictation. Any transcriptional errors that result from this process are unintentional     Nance Pear, MD 07/18/21 2258

## 2021-07-18 NOTE — ED Notes (Signed)
Pt daughter reports pt feels more short of breath. EKG repeated, VS reassessed,  pt medicated and assessed by [employer]. Pt placed on 2L of O2 for comfort.

## 2021-07-18 NOTE — ED Triage Notes (Signed)
Pt comes into the ED via POV c/o nausea and SHOB that has been ongoing for a week.  Pt lost her husband last week and since then has been declining.  Pt has not been eating well and has been sick on her stomach.  Pt's SHOB is able to be redirected and slowed.  Pt in NAD at this time with even and unlabored respirations.

## 2021-07-19 NOTE — ED Notes (Signed)
Pt went home with family

## 2021-07-19 NOTE — ED Provider Notes (Signed)
-----------------------------------------   12:19 AM on 07/19/2021 -----------------------------------------   Patient ate applesauce, feeling better and eager for discharge home.  Voices no complaints of chest pain or shortness of breath.  Strict return precautions given.  Patient and daughter verbalized understanding agree with plan of care.   Paulette Blanch, MD 07/19/21 [phone]

## 2021-07-21 ENCOUNTER — Inpatient Hospital Stay
Admission: EM | Admit: 2021-07-21 | Discharge: 2021-07-26 | DRG: 640 | Disposition: A | Discharge status: Home Health | Payer: Medicare Other | Attending: Internal Medicine | Admitting: Internal Medicine

## 2021-07-21 ENCOUNTER — Observation Stay: Payer: Medicare Other

## 2021-07-21 ENCOUNTER — Other Ambulatory Visit: Payer: Self-pay

## 2021-07-21 DIAGNOSIS — E785 Hyperlipidemia, unspecified: Secondary | ICD-10-CM | POA: Insufficient documentation

## 2021-07-21 DIAGNOSIS — Z79818 Long term (current) use of other agents affecting estrogen receptors and estrogen levels: Secondary | ICD-10-CM

## 2021-07-21 DIAGNOSIS — Z681 Body mass index (BMI) 19 or less, adult: Secondary | ICD-10-CM

## 2021-07-21 DIAGNOSIS — R778 Other specified abnormalities of plasma proteins: Secondary | ICD-10-CM | POA: Diagnosis present

## 2021-07-21 DIAGNOSIS — R627 Adult failure to thrive: Secondary | ICD-10-CM | POA: Diagnosis not present

## 2021-07-21 DIAGNOSIS — Z923 Personal history of irradiation: Secondary | ICD-10-CM

## 2021-07-21 DIAGNOSIS — H9190 Unspecified hearing loss, unspecified ear: Secondary | ICD-10-CM | POA: Diagnosis present

## 2021-07-21 DIAGNOSIS — Z85820 Personal history of malignant melanoma of skin: Secondary | ICD-10-CM

## 2021-07-21 DIAGNOSIS — E860 Dehydration: Secondary | ICD-10-CM | POA: Diagnosis present

## 2021-07-21 DIAGNOSIS — F32A Depression, unspecified: Secondary | ICD-10-CM | POA: Diagnosis present

## 2021-07-21 DIAGNOSIS — E039 Hypothyroidism, unspecified: Secondary | ICD-10-CM | POA: Diagnosis present

## 2021-07-21 DIAGNOSIS — E871 Hypo-osmolality and hyponatremia: Secondary | ICD-10-CM | POA: Diagnosis present

## 2021-07-21 DIAGNOSIS — Z888 Allergy status to other drugs, medicaments and biological substances status: Secondary | ICD-10-CM

## 2021-07-21 DIAGNOSIS — E861 Hypovolemia: Secondary | ICD-10-CM | POA: Diagnosis present

## 2021-07-21 DIAGNOSIS — R7989 Other specified abnormal findings of blood chemistry: Secondary | ICD-10-CM | POA: Diagnosis present

## 2021-07-21 DIAGNOSIS — U071 COVID-19: Secondary | ICD-10-CM | POA: Diagnosis present

## 2021-07-21 DIAGNOSIS — E876 Hypokalemia: Secondary | ICD-10-CM | POA: Diagnosis present

## 2021-07-21 DIAGNOSIS — Z85828 Personal history of other malignant neoplasm of skin: Secondary | ICD-10-CM

## 2021-07-21 DIAGNOSIS — Z8711 Personal history of peptic ulcer disease: Secondary | ICD-10-CM

## 2021-07-21 DIAGNOSIS — Z66 Do not resuscitate: Secondary | ICD-10-CM | POA: Diagnosis present

## 2021-07-21 DIAGNOSIS — Z634 Disappearance and death of family member: Secondary | ICD-10-CM

## 2021-07-21 DIAGNOSIS — Z79890 Hormone replacement therapy (postmenopausal): Secondary | ICD-10-CM

## 2021-07-21 DIAGNOSIS — I248 Other forms of acute ischemic heart disease: Secondary | ICD-10-CM | POA: Diagnosis present

## 2021-07-21 DIAGNOSIS — R110 Nausea: Secondary | ICD-10-CM

## 2021-07-21 DIAGNOSIS — Z79899 Other long term (current) drug therapy: Secondary | ICD-10-CM

## 2021-07-21 DIAGNOSIS — E440 Moderate protein-calorie malnutrition: Secondary | ICD-10-CM | POA: Insufficient documentation

## 2021-07-21 LAB — COMPREHENSIVE METABOLIC PANEL
ALT: 18 U/L (ref 0–44)
AST: 21 U/L (ref 15–41)
Albumin: 4.1 g/dL (ref 3.5–5.0)
Alkaline Phosphatase: 140 U/L — ABNORMAL HIGH (ref 38–126)
Anion gap: 13 (ref 5–15)
BUN: 26 mg/dL — ABNORMAL HIGH (ref 8–23)
CO2: 21 mmol/L — ABNORMAL LOW (ref 22–32)
Calcium: 9.5 mg/dL (ref 8.9–10.3)
Chloride: 91 mmol/L — ABNORMAL LOW (ref 98–111)
Creatinine, Ser: 0.89 mg/dL (ref 0.44–1.00)
GFR, Estimated: >60 mL/min (ref >60)
Glucose, Bld: 110 mg/dL — ABNORMAL HIGH (ref 70–99)
Potassium: 2.9 mmol/L — ABNORMAL LOW (ref 3.5–5.1)
Sodium: 125 mmol/L — ABNORMAL LOW (ref 135–145)
Total Bilirubin: 1.6 mg/dL — ABNORMAL HIGH (ref 0.3–1.2)
Total Protein: 7.2 g/dL (ref 6.5–8.1)

## 2021-07-21 LAB — URINALYSIS, COMPLETE (UACMP) WITH MICROSCOPIC
Bacteria, UA: NONE SEEN
Bilirubin Urine: NEGATIVE
Glucose, UA: NEGATIVE mg/dL
Hgb urine dipstick: NEGATIVE
Ketones, ur: 20 mg/dL — AB
Nitrite: NEGATIVE
Protein, ur: NEGATIVE mg/dL
Specific Gravity, Urine: 1.006 (ref 1.005–1.030)
pH: 6 (ref 5.0–8.0)

## 2021-07-21 LAB — BASIC METABOLIC PANEL
Anion gap: 12 (ref 5–15)
Anion gap: 10 (ref 5–15)
BUN: 24 mg/dL — ABNORMAL HIGH (ref 8–23)
BUN: 22 mg/dL (ref 8–23)
CO2: 21 mmol/L — ABNORMAL LOW (ref 22–32)
CO2: 19 mmol/L — ABNORMAL LOW (ref 22–32)
Calcium: 8.7 mg/dL — ABNORMAL LOW (ref 8.9–10.3)
Calcium: 8.4 mg/dL — ABNORMAL LOW (ref 8.9–10.3)
Chloride: 94 mmol/L — ABNORMAL LOW (ref 98–111)
Chloride: 98 mmol/L (ref 98–111)
Creatinine, Ser: 0.85 mg/dL (ref 0.44–1.00)
Creatinine, Ser: 0.72 mg/dL (ref 0.44–1.00)
GFR, Estimated: >60 mL/min (ref >60)
GFR, Estimated: >60 mL/min (ref >60)
Glucose, Bld: 93 mg/dL (ref 70–99)
Glucose, Bld: 111 mg/dL — ABNORMAL HIGH (ref 70–99)
Potassium: 3.2 mmol/L — ABNORMAL LOW (ref 3.5–5.1)
Potassium: 3.6 mmol/L (ref 3.5–5.1)
Sodium: 127 mmol/L — ABNORMAL LOW (ref 135–145)
Sodium: 127 mmol/L — ABNORMAL LOW (ref 135–145)

## 2021-07-21 LAB — CBC
HCT: 35 % — ABNORMAL LOW (ref 36.0–46.0)
Hemoglobin: 13.1 g/dL (ref 12.0–15.0)
MCH: 30.9 pg (ref 26.0–34.0)
MCHC: 37.4 g/dL — ABNORMAL HIGH (ref 30.0–36.0)
MCV: 82.5 fL (ref 80.0–100.0)
Platelets: 242 10*3/uL (ref 150–400)
RBC: 4.24 MIL/uL (ref 3.87–5.11)
RDW: 14.2 % (ref 11.5–15.5)
WBC: 9 10*3/uL (ref 4.0–10.5)
nRBC: 0 % (ref 0.0–0.2)

## 2021-07-21 LAB — TROPONIN I (HIGH SENSITIVITY)
Troponin I (High Sensitivity): 71 ng/L — ABNORMAL HIGH (ref <18)
Troponin I (High Sensitivity): 70 ng/L — ABNORMAL HIGH (ref <18)
Troponin I (High Sensitivity): 58 ng/L — ABNORMAL HIGH (ref <18)
Troponin I (High Sensitivity): 50 ng/L — ABNORMAL HIGH (ref <18)

## 2021-07-21 LAB — PHOSPHORUS: Phosphorus: 2.8 mg/dL (ref 2.5–4.6)

## 2021-07-21 LAB — RESP PANEL BY RT-PCR (FLU A&B, COVID) ARPGX2
Influenza A by PCR: NEGATIVE
Influenza B by PCR: NEGATIVE
SARS Coronavirus 2 by RT PCR: POSITIVE — AB

## 2021-07-21 LAB — LIPASE, BLOOD: Lipase: 52 U/L — ABNORMAL HIGH (ref 11–51)

## 2021-07-21 LAB — SODIUM, URINE, RANDOM: Sodium, Ur: 40 mmol/L

## 2021-07-21 LAB — OSMOLALITY, URINE: Osmolality, Ur: 254 mOsm/kg — ABNORMAL LOW (ref 300–900)

## 2021-07-21 LAB — MAGNESIUM: Magnesium: 2 mg/dL (ref 1.7–2.4)

## 2021-07-21 LAB — OSMOLALITY: Osmolality: 268 mOsm/kg — ABNORMAL LOW (ref 275–295)

## 2021-07-21 MED ORDER — SODIUM CHLORIDE 0.9 % IV BOLUS
1000.0000 mL | Freq: Once | INTRAVENOUS | Status: AC
  Administered 2021-07-21: 1000 mL via INTRAVENOUS

## 2021-07-21 MED ORDER — POTASSIUM CHLORIDE CRYS ER 20 MEQ PO TBCR
40.0000 meq | EXTENDED_RELEASE_TABLET | Freq: Once | ORAL | Status: AC
  Administered 2021-07-21: 40 meq via ORAL
  Filled 2021-07-21: qty 2

## 2021-07-21 MED ORDER — ONDANSETRON HCL 4 MG/2ML IJ SOLN
4.0000 mg | Freq: Once | INTRAMUSCULAR | Status: AC
  Administered 2021-07-21: 4 mg via INTRAVENOUS
  Filled 2021-07-21: qty 2

## 2021-07-21 MED ORDER — ALBUTEROL SULFATE HFA 108 (90 BASE) MCG/ACT IN AERS
2.0000 | INHALATION_SPRAY | RESPIRATORY_TRACT | Status: DC | PRN
  Filled 2021-07-21: qty 6.7

## 2021-07-21 MED ORDER — POTASSIUM CHLORIDE 10 MEQ/100ML IV SOLN
10.0000 meq | Freq: Once | INTRAVENOUS | Status: AC
  Administered 2021-07-21: 10 meq via INTRAVENOUS
  Filled 2021-07-21: qty 100

## 2021-07-21 MED ORDER — SODIUM CHLORIDE 0.9 % IV SOLN
200.0000 mg | Freq: Once | INTRAVENOUS | Status: AC
  Administered 2021-07-21: 21:00:00 200 mg via INTRAVENOUS
  Filled 2021-07-21: qty 200

## 2021-07-21 MED ORDER — DM-GUAIFENESIN ER 30-600 MG PO TB12: 1.0000 | ORAL_TABLET | Freq: Two times a day (BID) | ORAL | Status: DC | PRN

## 2021-07-21 MED ORDER — ENOXAPARIN SODIUM 40 MG/0.4ML IJ SOSY
40.0000 mg | PREFILLED_SYRINGE | INTRAMUSCULAR | Status: DC
  Administered 2021-07-21 – 2021-07-25 (×5): 40 mg via SUBCUTANEOUS
  Filled 2021-07-21 (×5): qty 0.4

## 2021-07-21 MED ORDER — HYDRALAZINE HCL 20 MG/ML IJ SOLN
5.0000 mg | INTRAMUSCULAR | Status: DC | PRN
  Administered 2021-07-22: 5 mg via INTRAVENOUS
  Filled 2021-07-21 (×2): qty 1

## 2021-07-21 MED ORDER — SODIUM CHLORIDE 0.9 % IV SOLN: INTRAVENOUS | Status: DC

## 2021-07-21 MED ORDER — ALPRAZOLAM 0.25 MG PO TABS
0.2500 mg | ORAL_TABLET | Freq: Two times a day (BID) | ORAL | Status: DC | PRN
  Administered 2021-07-22 – 2021-07-23 (×3): 0.25 mg via ORAL
  Filled 2021-07-21 (×3): qty 1

## 2021-07-21 MED ORDER — ATORVASTATIN CALCIUM 20 MG PO TABS
10.0000 mg | ORAL_TABLET | Freq: Every day | ORAL | Status: DC
  Administered 2021-07-21 – 2021-07-26 (×6): 10 mg via ORAL
  Filled 2021-07-21 (×6): qty 1

## 2021-07-21 MED ORDER — ASPIRIN EC 81 MG PO TBEC
81.0000 mg | DELAYED_RELEASE_TABLET | Freq: Every day | ORAL | Status: DC
  Administered 2021-07-21 – 2021-07-26 (×6): 81 mg via ORAL
  Filled 2021-07-21 (×6): qty 1

## 2021-07-21 MED ORDER — ACETAMINOPHEN 325 MG PO TABS
650.0000 mg | ORAL_TABLET | Freq: Four times a day (QID) | ORAL | Status: DC | PRN
  Administered 2021-07-22: 22:00:00 650 mg via ORAL
  Filled 2021-07-21: qty 2

## 2021-07-21 MED ORDER — LEVOTHYROXINE SODIUM 25 MCG PO TABS
25.0000 ug | ORAL_TABLET | Freq: Every day | ORAL | Status: DC
  Administered 2021-07-22 – 2021-07-26 (×5): 25 ug via ORAL
  Filled 2021-07-21 (×5): qty 1

## 2021-07-21 MED ORDER — SODIUM CHLORIDE 0.9 % IV SOLN
100.0000 mg | Freq: Every day | INTRAVENOUS | Status: AC
  Administered 2021-07-22 – 2021-07-25 (×4): 100 mg via INTRAVENOUS
  Filled 2021-07-21 (×4): qty 100

## 2021-07-21 MED ORDER — ONDANSETRON HCL 4 MG/2ML IJ SOLN
4.0000 mg | Freq: Three times a day (TID) | INTRAMUSCULAR | Status: DC | PRN
  Administered 2021-07-22 (×2): 4 mg via INTRAVENOUS
  Filled 2021-07-21 (×2): qty 2

## 2021-07-21 NOTE — Progress Notes (Signed)
MD, Blaine Hamper notified. Provided in shift report to night RN       07/21/21 1740  Vitals  Temp 98.6 F (37 C)  Temp Source Oral  BP (!) 183/72  MAP (mmHg) 105  BP Location Right Arm  BP Method Automatic  Patient Position (if appropriate) Sitting  Pulse Rate 68  Pulse Rate Source Monitor  Resp 16  MEWS COLOR  MEWS Score Color Green  MEWS Score  MEWS Temp 0  MEWS Systolic 0  MEWS Pulse 0  MEWS RR 0  MEWS LOC 0  MEWS Score 0

## 2021-07-21 NOTE — H&P (Signed)
History and Physical    Krystal Richardson [id number] DOB: [birth year] DOA: 07/21/2021  Referring MD/NP/PA:   PCP: Tracie Harrier, MD   Patient coming from:  The patient is coming from home.    Chief Complaint: Failure to thrive  HPI: Krystal Richardson is a 87 y.o. female with medical history significant of hyperlipidemia, hypothyroidism, basal cell carcinoma, melanoma (s/p of radiation therapy), gastric ulcer disease, thyroid nodules, hard of hearing, who present with failure to thrive.  Per her son, patient has been unwell for the more than 2 weeks since the passing of her husband.  Patient has a poor appetite, decreased oral intake.  Patient has nausea and dry heaves, no vomiting, diarrhea or abdominal pain.  No symptoms of UTI. Patient was was seen in ED2 days ago with same symptoms as well as shortness of breath but reports the shortness of breath has improved, however denies any improvement in poor appetite. Patient does not have chest pain, shortness of breath, cough, fever or chills.  She was referred from her PCP because she has had a 16 pound weight loss in the last 2 weeks.   ED Course: pt was found to have WBC 9.0, troponin level 71, positive covid19, potassium 2.9, sodium 125, magnesium 2.0, renal function okay, temperature normal, blood pressure 118/60, heart rate 79, RR 16, oxygen saturation 97% on room air.  Patient is placed on MedSurg bed for observation   Review of Systems:   General: no fevers, chills, no body weight gain, has poor appetite, has fatigue HEENT: no blurry vision, sore throat Respiratory: no dyspnea, coughing, wheezing CV: no chest pain, no palpitations GI: has nausea, dry heaves, no abdominal pain, diarrhea, constipation GU: no dysuria, burning on urination, increased urinary frequency, hematuria  Ext: no leg edema Neuro: no unilateral weakness, numbness, or tingling, no vision change or hearing loss Skin: no rash, no skin tear. MSK: No muscle spasm, no  deformity, no limitation of range of movement in spin Heme: No easy bruising.  Travel history: No recent long distant travel.  Allergy:  Allergies  Allergen Reactions   Toprol Xl [Metoprolol Tartrate]     Past Medical History:  Diagnosis Date   Actinic keratosis 03/12/2019   mid post scalp melanoma graft scar   Basal cell carcinoma 08/04/2014   R forehead - excision 08/19/2014   BCC (basal cell carcinoma of skin) 02/20/2019   L dorsum foot   Hyperlipidemia    Melanoma ([place]) 2002   with chemo and rad tx   Personal history of chemotherapy 2003   MELANOMA   Personal history of radiation therapy 2002   MELANOMA   Stomach ulcer    Urethral caruncle     Past Surgical History:  Procedure Laterality Date   BREAST EXCISIONAL BIOPSY Left 1986   EXCISIONAL - NEG   cataract     hemmriodectomy  1980   melanoma  2002    Social History:  reports that she has never smoked. She has never used smokeless tobacco. She reports that she does not drink alcohol and does not use drugs.  Family History:  Family History  Problem Relation Age of Onset   Kidney disease Neg Hx    Bladder Cancer Neg Hx    Prostate cancer Neg Hx    Breast cancer Neg Hx      Prior to Admission medications   Medication Sig Start Date End Date Taking? Authorizing Provider  levothyroxine (SYNTHROID, LEVOTHROID) 25 MCG tablet Take 25  mcg by mouth daily before breakfast.   Yes [provider]  ondansetron (ZOFRAN) 4 MG tablet Take 1 tablet (4 mg total) by mouth every 8 (eight) hours as needed for nausea or vomiting. 07/18/21  Yes Nance Pear, MD    Physical Exam: Vitals:   07/21/21 1330 07/21/21 1500 07/21/21 1530 07/21/21 1600  BP: 118/60 (!) 165/61 (!) 176/65 (!) 170/65  Pulse: 79 69 68 69  Resp: 16 (!) '22 17 19  '$ Temp:      TempSrc:      SpO2: 97% 100% 100% 99%  Weight:      Height:       General: Not in acute distress.  Dry mucous membrane HEENT:       Eyes: PERRL, EOMI, no scleral  icterus.       ENT: No discharge from the ears and nose, no pharynx injection, no tonsillar enlargement.        Neck: No JVD, no bruit, no mass felt. Heme: No neck lymph node enlargement. Cardiac: S1/S2, RRR, No murmurs, No gallops or rubs. Respiratory: No rales, wheezing, rhonchi or rubs. GI: Soft, nondistended, nontender, no rebound pain, no organomegaly, BS present. GU: No hematuria Ext: No pitting leg edema bilaterally. 1+DP/PT pulse bilaterally. Musculoskeletal: No joint deformities, No joint redness or warmth, no limitation of ROM in spin. Skin: No rashes.  Neuro: Alert, oriented X3, cranial nerves II-XII grossly intact, moves all extremities normally.  Psych: Patient is not psychotic, no suicidal or hemocidal ideation.  Labs on Admission: I have personally reviewed following labs and imaging studies  CBC: Recent Labs  Lab 07/18/21 1440 07/21/21 1140  WBC 7.4 9.0  HGB 11.9* 13.1  HCT 33.3* 35.0*  MCV 86.0 82.5  PLT 195 [id number]   Basic Metabolic Panel: Recent Labs  Lab 07/18/21 1440 07/21/21 1140 07/21/21 1404 07/21/21 1552  NA 129* 125*  --  127*  K 3.6 2.9*  --  3.2*  CL 99 91*  --  94*  CO2 20* 21*  --  21*  GLUCOSE 121* 110*  --  93  BUN 15 26*  --  24*  CREATININE 0.57 0.89  --  0.85  CALCIUM 9.1 9.5  --  8.7*  MG  --   --  2.0  --   PHOS  --   --   --  2.8   GFR: Estimated Creatinine Clearance: 31.9 mL/min (by C-G formula based on SCr of 0.85 mg/dL). Liver Function Tests: Recent Labs  Lab 07/18/21 1440 07/21/21 1140  AST 23 21  ALT 18 18  ALKPHOS 153* 140*  BILITOT 1.2 1.6*  PROT 6.8 7.2  ALBUMIN 4.0 4.1   Recent Labs  Lab 07/18/21 1440 07/21/21 1140  LIPASE 48 52*   No results for input(s): AMMONIA in the last 168 hours. Coagulation Profile: No results for input(s): INR, PROTIME in the last 168 hours. Cardiac Enzymes: No results for input(s): CKTOTAL, CKMB, CKMBINDEX, TROPONINI in the last 168 hours. BNP (last 3 results) No results for  input(s): PROBNP in the last 8760 hours. HbA1C: No results for input(s): HGBA1C in the last 72 hours. CBG: No results for input(s): GLUCAP in the last 168 hours. Lipid Profile: No results for input(s): CHOL, HDL, LDLCALC, TRIG, CHOLHDL, LDLDIRECT in the last 72 hours. Thyroid Function Tests: No results for input(s): TSH, T4TOTAL, FREET4, T3FREE, THYROIDAB in the last 72 hours. Anemia Panel: No results for input(s): VITAMINB12, FOLATE, FERRITIN, TIBC, IRON, RETICCTPCT in the last 72 hours.  Urine analysis:    Component Value Date/Time   COLORURINE YELLOW (A) 07/21/2021 1551   APPEARANCEUR CLEAR (A) 07/21/2021 1551   APPEARANCEUR Clear 06/25/2015 1416   LABSPEC 1.006 07/21/2021 1551   LABSPEC 1.010 09/09/2013 1201   PHURINE 6.0 07/21/2021 1551   GLUCOSEU NEGATIVE 07/21/2021 1551   GLUCOSEU Negative 09/09/2013 1201   HGBUR NEGATIVE 07/21/2021 1551   BILIRUBINUR NEGATIVE 07/21/2021 1551   BILIRUBINUR Negative 06/25/2015 1416   BILIRUBINUR Negative 09/09/2013 1201   KETONESUR 20 (A) 07/21/2021 1551   PROTEINUR NEGATIVE 07/21/2021 1551   NITRITE NEGATIVE 07/21/2021 1551   LEUKOCYTESUR SMALL (A) 07/21/2021 1551   LEUKOCYTESUR 3+ 09/09/2013 1201   Sepsis Labs: '@LABRCNTIP'$ (procalcitonin:4,lacticidven:4) ) Recent Results (from the past 240 hour(s))  Resp Panel by RT-PCR (Flu A&B, Covid) Urine, Clean Catch     Status: Abnormal   Collection Time: 07/21/21  3:51 PM   Specimen: Urine, Clean Catch; Nasopharyngeal(NP) swabs in vial transport medium  Result Value Ref Range Status   SARS Coronavirus 2 by RT PCR POSITIVE (A) NEGATIVE Final    Comment: RESULT CALLED TO, READ BACK BY AND VERIFIED WITH: SAM CAIN '@1727'$  07/21/21 MJU (NOTE) SARS-CoV-2 target nucleic acids are DETECTED.  The SARS-CoV-2 RNA is generally detectable in upper respiratory specimens during the acute phase of infection. Positive results are indicative of the presence of the identified virus, but do not rule out  bacterial infection or co-infection with other pathogens not detected by the test. Clinical correlation with patient history and other diagnostic information is necessary to determine patient infection status. The expected result is Negative.  Fact Sheet for Patients: EntrepreneurPulse.com.au  Fact Sheet for Healthcare Providers: IncredibleEmployment.be  This test is not yet approved or cleared by the [place] FDA and  has been authorized for detection and/or diagnosis of SARS-CoV-2 by FDA under an Emergency Use Authorization (EUA).  This EUA will remain in effect (meaning this test can be used)  for the duration of  the COVID-19 declaration under Section 564(b)(1) of the Act, 21 U.S.C. section 360bbb-3(b)(1), unless the authorization is terminated or revoked sooner.     Influenza A by PCR NEGATIVE NEGATIVE Final   Influenza B by PCR NEGATIVE NEGATIVE Final    Comment: (NOTE) The Xpert Xpress SARS-CoV-2/FLU/RSV plus assay is intended as an aid in the diagnosis of influenza from Nasopharyngeal swab specimens and should not be used as a sole basis for treatment. Nasal washings and aspirates are unacceptable for Xpert Xpress SARS-CoV-2/FLU/RSV testing.  Fact Sheet for Patients: EntrepreneurPulse.com.au  Fact Sheet for Healthcare Providers: IncredibleEmployment.be  This test is not yet approved or cleared by the [place] FDA and has been authorized for detection and/or diagnosis of SARS-CoV-2 by FDA under an Emergency Use Authorization (EUA). This EUA will remain in effect (meaning this test can be used) for the duration of the COVID-19 declaration under Section 564(b)(1) of the Act, 21 U.S.C. section 360bbb-3(b)(1), unless the authorization is terminated or revoked.  Performed at [facility], [address], [place], [place] [address]      Radiological Exams on Admission: No  results found.   EKG: I have personally reviewed.  Sinus rhythm, QTC 446, low voltage, early R wave progression  Assessment/Plan Principal Problem:   Failure to thrive in adult Active Problems:   HLD (hyperlipidemia)   Hyponatremia   Elevated troponin   Hypokalemia   Hypothyroidism   COVID-19 virus infection   Failure to thrive in adult: Likely multifactorial etiology, particularly due to  COVID-19 infection..  -will placed in med-surg bed for obs -PT/OT -treat COVID-19 infection with remdesivir  COVID-19 virus infection: Patient does not have cough, shortness of breath or chest pain.  Oxygen saturation 97% on room air currently.  Since patient has failure to thrive, will start treatment with remdesivir. -Remdesivir -As needed albuterol and Mucinex -f/u CXR  Elevated troponin: Troponin level 71 --> 70, no chest pain.  Likely due to demand ischemia. -Trend troponin -Check A1c, FLP -Aspirin 81 mg daily -Started Lipitor 10 mg daily  HLD (hyperlipidemia) -started 10 mg lipitor  Hyponatremia: Na 125.  Mental status normal.  Likely multifactorial etiology, including decreased oral intake, dehydration - Will check urine sodium, urine osmolality, serum osmolality. - check TSH - IVF: 1L NS in ED, will continue with IV normal saline at 75 mL/h - f/u by BMP q8h - avoid over correction too fast due to risk of central pontine myelinolysis  Hypokalemia: K 2.9. -repleted K -check Mg --> 2.0 -check Phosphorus  Hypothyroidism -Continue Synthroid       DVT ppx: SQ Lovenox Code Status: DNR per her son Family Communication:  Yes, patient's  son  at bed side Disposition Plan:  Anticipate discharge back to previous environment Consults called:  none Admission status and Level of care: Med-Surg:    for obs   Status is: Observation  The patient remains OBS appropriate and will d/c before 2 midnights.  Dispo: The patient is from: Home              Anticipated d/c is to:  Home              Patient currently is not medically stable to d/c.   Difficult to place patient No         Date of Service 07/21/2021    [facility]   If 7PM-7AM, please contact night-coverage [email or web address] 07/21/2021, 5:40 PM

## 2021-07-21 NOTE — Consult Note (Signed)
Remdesivir - Pharmacy Brief Note   O:  SpO2: 99% on RA   A/P:  Remdesivir 200 mg IVPB once followed by 100 mg IVPB daily x 4 days.   Dorothe Pea, PharmD, BCPS Clinical Pharmacist   07/21/2021 6:06 PM

## 2021-07-21 NOTE — ED Notes (Signed)
[facility] RN aware of assigned bed

## 2021-07-21 NOTE — ED Triage Notes (Signed)
Dr Carmie End called wanting pt admitted for failure to thrive, 16lb weight loss in 2 weeks.

## 2021-07-21 NOTE — ED Triage Notes (Signed)
Pt to ED with son, son reports pt has not been eating in 2 weeks, nauseous. Pt denies pain, just reports sick to stomach.  Son states PCP stated pt needs to be admitted Pt alert and oriented, HOH  Prescribed zofran, no relief

## 2021-07-21 NOTE — ED Provider Notes (Signed)
Dr.  Adron Bene Emergency Department Provider Note  ____________________________________________   Event Date/Time   First MD Initiated Contact with Patient 07/21/21 1242     (approximate)  I have reviewed the triage vital signs and the nursing notes.   HISTORY  Chief Complaint Nausea  HPI Krystal Richardson is a 87 y.o. female who reports to the emergency department with son as referred from PCP.  Patient and son state that she has been unwell for the last 2 weeks since the passing of her husband.  She has been unable to eat, nauseated with dry heaving but no active vomiting.  She was seen in the emergency department 2 days ago with same symptoms as well as shortness of breath but reports the shortness of breath is improved, however denies any improvement in the nausea.  She denies any abdominal pain, chest pain, shortness of breath, dysuria, back pain, headache, blurred vision or other associated symptoms today.  She was referred from her PCP because she has had a 16 pound weight loss in the last 2 weeks.  Symptoms have been unrelieved by Zofran up until this point.          Past Medical History:  Diagnosis Date   Actinic keratosis 03/12/2019   mid post scalp melanoma graft scar   Basal cell carcinoma 08/04/2014   R forehead - excision 08/19/2014   BCC (basal cell carcinoma of skin) 02/20/2019   L dorsum foot   Hyperlipidemia    Melanoma ([place]) 2002   with chemo and rad tx   Personal history of chemotherapy 2003   MELANOMA   Personal history of radiation therapy 2002   MELANOMA   Stomach ulcer    Urethral caruncle     Patient Active Problem List   Diagnosis Date Noted   Failure to thrive in adult 07/21/2021   Hyperlipidemia    Hyponatremia    Elevated troponin    Hypokalemia    Hypothyroidism    Multiple thyroid nodules 09/09/2015   Urethral caruncle 06/28/2015   Osteoporosis, post-menopausal 06/25/2015   Melanoma of scalp ([place]) 06/25/2015    HLD (hyperlipidemia) 06/25/2015    Past Surgical History:  Procedure Laterality Date   BREAST EXCISIONAL BIOPSY Left 1986   EXCISIONAL - NEG   cataract     hemmriodectomy  1980   melanoma  2002    Prior to Admission medications   Medication Sig Start Date End Date Taking? Authorizing Provider  levothyroxine (SYNTHROID, LEVOTHROID) 25 MCG tablet Take 25 mcg by mouth daily before breakfast.   Yes [provider]  ondansetron (ZOFRAN) 4 MG tablet Take 1 tablet (4 mg total) by mouth every 8 (eight) hours as needed for nausea or vomiting. 07/18/21  Yes Nance Pear, MD    Allergies Toprol xl [metoprolol tartrate]  Family History  Problem Relation Age of Onset   Kidney disease Neg Hx    Bladder Cancer Neg Hx    Prostate cancer Neg Hx    Breast cancer Neg Hx     Social History Social History   Tobacco Use   Smoking status: Never   Smokeless tobacco: Never  Substance Use Topics   Alcohol use: No    Alcohol/week: 0.0 standard drinks   Drug use: No    Review of Systems Constitutional: No fever/chills Eyes: No visual changes. ENT: No sore throat. Cardiovascular: Denies chest pain. Respiratory: Denies shortness of breath. Gastrointestinal: No abdominal pain.  Unable to tolerate p.o.  + nausea,  no vomiting.  No diarrhea.  No constipation. Genitourinary: Negative for dysuria. Musculoskeletal: Negative for back pain. Skin: Negative for rash. Neurological: Negative for headaches, focal weakness or numbness.  ____________________________________________   PHYSICAL EXAM:  VITAL SIGNS: ED Triage Vitals  Enc Vitals Group     BP 07/21/21 1137 (!) 118/101     Pulse Rate 07/21/21 1137 83     Resp 07/21/21 1137 18     Temp 07/21/21 1137 98.6 F (37 C)     Temp Source 07/21/21 1137 Oral     SpO2 07/21/21 1137 100 %     Weight 07/21/21 1138 101 lb 6.6 oz (46 kg)     Height 07/21/21 1138 '5\' 3"'  (1.6 m)     Head Circumference --      Peak Flow --      Pain Score  07/21/21 1138 0     Pain Loc --      Pain Edu? --      Excl. in [place]? --    Constitutional: Alert and oriented.  Frail appearing but in no acute distress. Eyes: Conjunctivae are normal. PERRL. EOMI. Head: Atraumatic. Nose: No congestion/rhinnorhea. Mouth/Throat: Mucous membranes are moist.  Oropharynx non-erythematous. Neck: No stridor.   Cardiovascular: Normal rate, regular rhythm. Grossly normal heart sounds.  Good peripheral circulation. Respiratory: Normal respiratory effort.  No retractions. Lungs CTAB. Gastrointestinal: Soft and nontender. No distention. No abdominal bruits. No CVA tenderness. Musculoskeletal: No lower extremity tenderness nor edema.  No joint effusions. Neurologic:  Normal speech and language. No gross focal neurologic deficits are appreciated. No gait instability. Skin:  Skin is warm, dry and intact. No rash noted. Psychiatric: Mood and affect are normal. Speech and behavior are normal.  ____________________________________________   LABS (all labs ordered are listed, but only abnormal results are displayed)  Labs Reviewed  LIPASE, BLOOD - Abnormal; Notable for the following components:      Result Value   Lipase 52 (*)    All other components within normal limits  COMPREHENSIVE METABOLIC PANEL - Abnormal; Notable for the following components:   Sodium 125 (*)    Potassium 2.9 (*)    Chloride 91 (*)    CO2 21 (*)    Glucose, Bld 110 (*)    BUN 26 (*)    Alkaline Phosphatase 140 (*)    Total Bilirubin 1.6 (*)    All other components within normal limits  CBC - Abnormal; Notable for the following components:   HCT 35.0 (*)    MCHC 37.4 (*)    All other components within normal limits  URINALYSIS, COMPLETE (UACMP) WITH MICROSCOPIC - Abnormal; Notable for the following components:   Color, Urine YELLOW (*)    APPearance CLEAR (*)    Ketones, ur 20 (*)    Leukocytes,Ua SMALL (*)    All other components within normal limits  TROPONIN I (HIGH  SENSITIVITY) - Abnormal; Notable for the following components:   Troponin I (High Sensitivity) 71 (*)    All other components within normal limits  RESP PANEL BY RT-PCR (FLU A&B, COVID) ARPGX2  MAGNESIUM  SODIUM, URINE, RANDOM  PHOSPHORUS  OSMOLALITY, URINE  OSMOLALITY  BASIC METABOLIC PANEL  BASIC METABOLIC PANEL  HEMOGLOBIN A1C  TROPONIN I (HIGH SENSITIVITY)   ____________________________________________  EKG  Normal sinus rhythm with a rate of 74 bpm.  No ST segment elevations or depressions, normal QTC.   ____________________________________________   INITIAL IMPRESSION / ASSESSMENT AND PLAN / ED COURSE  As  part of my medical decision making, I reviewed the following data within the [place] notes reviewed and incorporated, Labs reviewed, Radiograph reviewed, Discussed with admitting physician Dr. Blaine Hamper, Evaluated by EM attending Dr. Cherylann Banas, and Notes from prior ED visits        Patient is a 87 year old female who reports to the emergency department with son at the recommendation of PCP for evaluation of failure to thrive, unable to tolerate p.o., nausea for the last 2 weeks since the death of her husband.  See HPI for further details.  In triage patient has grossly normal vital signs.  Physical exam as above, notably no chest pain, tachypnea or labored breathing, no abdominal tenderness.  Patient was seen 2 days ago and discharged after gentle fluid hydration and nausea medication, however after seeing PCP today, had a notable 16 pound weight loss.  Repeat laboratory evaluation includes a CMP showing a hyponatremia of 125, hypokalemia of 2.9, hypochloremia of 91, mild elevation in alk phos and total bilirubin.  Very mild increase in lipase at 52.  Grossly normal CBC.  Magnesium is normal at 2.0.  Troponin initial is 71, thought to be related to demand given poor electrolyte status.  Urinalysis with no bacteria and small number of leukocytes.  Initial  EKG had a large baseline wander with difficult interpretation, however repeat EKG shows no significant ST changes, and similar to prior 2 days ago.  The patient also denies any chest pain or shortness of breath associated.  Low suspicion for intra-abdominal pathology given no tenderness, fever, leukocytosis.  Metabolic derangement likely secondary to poor p.o. intake for the last 2 weeks.  Patient also evaluated by attending Dr. Cherylann Banas.  Current plan is to seek admission to the hospital for failure to thrive and metabolic derangement.  Patient and family are amenable with this plan.  Case was discussed with hospitalist attending Dr. Blaine Hamper who agrees to admit the patient.  Patient is stable at time for transfer to the floor.      ____________________________________________   FINAL CLINICAL IMPRESSION(S) / ED DIAGNOSES  Final diagnoses:  Hyponatremia  Failure to thrive in adult  Nausea     ED Discharge Orders     None        Note:  This document was prepared using Dragon voice recognition software and may include unintentional dictation errors.    Marlana Salvage, PA 07/21/21 1647    Arta Silence, MD 07/22/21 [phone]

## 2021-07-22 ENCOUNTER — Encounter: Payer: Self-pay | Admitting: Internal Medicine

## 2021-07-22 DIAGNOSIS — Z85820 Personal history of malignant melanoma of skin: Secondary | ICD-10-CM | POA: Diagnosis not present

## 2021-07-22 DIAGNOSIS — I248 Other forms of acute ischemic heart disease: Secondary | ICD-10-CM | POA: Diagnosis present

## 2021-07-22 DIAGNOSIS — Z8711 Personal history of peptic ulcer disease: Secondary | ICD-10-CM | POA: Diagnosis not present

## 2021-07-22 DIAGNOSIS — Z79899 Other long term (current) drug therapy: Secondary | ICD-10-CM | POA: Diagnosis not present

## 2021-07-22 DIAGNOSIS — Z888 Allergy status to other drugs, medicaments and biological substances status: Secondary | ICD-10-CM | POA: Diagnosis not present

## 2021-07-22 DIAGNOSIS — Z634 Disappearance and death of family member: Secondary | ICD-10-CM | POA: Diagnosis not present

## 2021-07-22 DIAGNOSIS — E860 Dehydration: Secondary | ICD-10-CM | POA: Diagnosis present

## 2021-07-22 DIAGNOSIS — U071 COVID-19: Secondary | ICD-10-CM | POA: Diagnosis present

## 2021-07-22 DIAGNOSIS — R627 Adult failure to thrive: Secondary | ICD-10-CM | POA: Diagnosis present

## 2021-07-22 DIAGNOSIS — Z66 Do not resuscitate: Secondary | ICD-10-CM | POA: Diagnosis present

## 2021-07-22 DIAGNOSIS — E440 Moderate protein-calorie malnutrition: Secondary | ICD-10-CM | POA: Diagnosis present

## 2021-07-22 DIAGNOSIS — Z79890 Hormone replacement therapy (postmenopausal): Secondary | ICD-10-CM | POA: Diagnosis not present

## 2021-07-22 DIAGNOSIS — Z681 Body mass index (BMI) 19 or less, adult: Secondary | ICD-10-CM | POA: Diagnosis not present

## 2021-07-22 DIAGNOSIS — E785 Hyperlipidemia, unspecified: Secondary | ICD-10-CM | POA: Diagnosis present

## 2021-07-22 DIAGNOSIS — E876 Hypokalemia: Secondary | ICD-10-CM | POA: Diagnosis present

## 2021-07-22 DIAGNOSIS — Z79818 Long term (current) use of other agents affecting estrogen receptors and estrogen levels: Secondary | ICD-10-CM | POA: Diagnosis not present

## 2021-07-22 DIAGNOSIS — E039 Hypothyroidism, unspecified: Secondary | ICD-10-CM | POA: Diagnosis present

## 2021-07-22 DIAGNOSIS — E871 Hypo-osmolality and hyponatremia: Secondary | ICD-10-CM | POA: Diagnosis present

## 2021-07-22 DIAGNOSIS — E861 Hypovolemia: Secondary | ICD-10-CM | POA: Diagnosis present

## 2021-07-22 DIAGNOSIS — Z923 Personal history of irradiation: Secondary | ICD-10-CM | POA: Diagnosis not present

## 2021-07-22 DIAGNOSIS — Z85828 Personal history of other malignant neoplasm of skin: Secondary | ICD-10-CM | POA: Diagnosis not present

## 2021-07-22 DIAGNOSIS — H9190 Unspecified hearing loss, unspecified ear: Secondary | ICD-10-CM | POA: Diagnosis present

## 2021-07-22 DIAGNOSIS — F32A Depression, unspecified: Secondary | ICD-10-CM | POA: Diagnosis present

## 2021-07-22 LAB — LIPID PANEL
Cholesterol: 224 mg/dL — ABNORMAL HIGH (ref 0–200)
HDL: 50 mg/dL (ref >40)
LDL Cholesterol: 159 mg/dL — ABNORMAL HIGH (ref 0–99)
Total CHOL/HDL Ratio: 4.5 RATIO
Triglycerides: 75 mg/dL (ref <150)
VLDL: 15 mg/dL (ref 0–40)

## 2021-07-22 LAB — BASIC METABOLIC PANEL
Anion gap: 9 (ref 5–15)
BUN: 19 mg/dL (ref 8–23)
CO2: 19 mmol/L — ABNORMAL LOW (ref 22–32)
Calcium: 8.2 mg/dL — ABNORMAL LOW (ref 8.9–10.3)
Chloride: 97 mmol/L — ABNORMAL LOW (ref 98–111)
Creatinine, Ser: 0.43 mg/dL — ABNORMAL LOW (ref 0.44–1.00)
GFR, Estimated: >60 mL/min (ref >60)
Glucose, Bld: 94 mg/dL (ref 70–99)
Potassium: 3.3 mmol/L — ABNORMAL LOW (ref 3.5–5.1)
Sodium: 125 mmol/L — ABNORMAL LOW (ref 135–145)

## 2021-07-22 LAB — TSH: TSH: 1.58 u[IU]/mL (ref 0.350–4.500)

## 2021-07-22 LAB — HEMOGLOBIN A1C
Hgb A1c MFr Bld: 6.2 % — ABNORMAL HIGH (ref 4.8–5.6)
Mean Plasma Glucose: 131 mg/dL

## 2021-07-22 MED ORDER — POLYETHYLENE GLYCOL 3350 17 G PO PACK
17.0000 g | PACK | Freq: Every day | ORAL | Status: DC | PRN
  Administered 2021-07-22 – 2021-07-23 (×2): 17 g via ORAL
  Filled 2021-07-22 (×2): qty 1

## 2021-07-22 MED ORDER — SENNOSIDES-DOCUSATE SODIUM 8.6-50 MG PO TABS
1.0000 | ORAL_TABLET | Freq: Every day | ORAL | Status: DC
  Administered 2021-07-22 – 2021-07-24 (×2): 1 via ORAL
  Filled 2021-07-22 (×3): qty 1

## 2021-07-22 MED ORDER — ADULT MULTIVITAMIN W/MINERALS CH
1.0000 | ORAL_TABLET | Freq: Every day | ORAL | Status: DC
  Administered 2021-07-22 – 2021-07-26 (×5): 1 via ORAL
  Filled 2021-07-22 (×5): qty 1

## 2021-07-22 MED ORDER — ENSURE ENLIVE PO LIQD
237.0000 mL | Freq: Two times a day (BID) | ORAL | Status: DC
  Administered 2021-07-22 – 2021-07-26 (×8): 237 mL via ORAL

## 2021-07-22 MED ORDER — POTASSIUM CHLORIDE CRYS ER 20 MEQ PO TBCR
40.0000 meq | EXTENDED_RELEASE_TABLET | Freq: Once | ORAL | Status: AC
  Administered 2021-07-22: 12:00:00 40 meq via ORAL
  Filled 2021-07-22: qty 2

## 2021-07-22 NOTE — TOC Progression Note (Signed)
Transition of Care [facility]) - Progression Note    Patient Details  Name: Krystal Richardson MRN: [id number] Date of Birth: February 26, 1929  Transition of [facility]) CM/SW [place], RN Phone Number: 07/22/2021, 12:03 PM  Clinical Narrative:   Spoke to patient's son, Damonie Peardon.  She is admitted with Failure to Thrive, son states she has not been eating and has been "heaving" ever since her husband of 49 years passed away 2 weeks ago.  She now lives alone, but her son lives next door, and her brother lives in the neighborhood.  Her other son lives 20 minutes from her home.   Mr. Lomack states that patient is typically spry and independent.  She insists on living alone but family will be in and out of her house during the day, but patient refuses to have someone stay with her.  Family is supportive, no concerns with getting to appointments or getting medications.  Patient has walker at home.    Family is open to home health, if that is the current recommendation, and PT will see patient today to make that determination.  TOC contact information given to son, TOC to follow to discharge.         Expected Discharge Plan and Services                                                 Social Determinants of Health (SDOH) Interventions    Readmission Risk Interventions No flowsheet data found.

## 2021-07-22 NOTE — Evaluation (Signed)
Physical Therapy Evaluation Patient Details Name: Krystal Richardson MRN: [id number] DOB: 02-14-1929 Today's Date: 07/22/2021   History of Present Illness  87 y.o. female with medical history significant of hyperlipidemia, hypothyroidism, basal cell carcinoma, melanoma (s/p of radiation therapy), gastric ulcer disease, thyroid nodules, hard of hearing, who present with failure to thrive. Per her son, patient has been unwell for the more than 2 weeks since the passing of her husband. Patient has a poor appetite, decreased oral intake.  Patient has nausea and dry heaves, no vomiting, diarrhea or abdominal pain.  No symptoms of UTI. Patient was was seen in ED2 days ago with same symptoms as well as shortness of breath but reports the shortness of breath has improved, however denies any improvement in poor appetite. Patient does not have chest pain, shortness of breath, cough, fever or chills.  She was referred from her PCP because she has had a 16 pound weight loss in the last 2 weeks. Found to be Covid-19+.    Clinical Impression  Pt received in Semi-Fowler's position and agreeable to therapy.  Pt is HOH, however was able to communicate and respond to instructions with good technique.  Pt performed bed-level exercises without any difficulty, however weakness is noted while performing.  Pt is able to come into standing without much difficulty, but once upright, has minimal instability due to LE weakness.  Pt is able navigate in room and perform laps with no complications or knee buckling, only CGA necessary.  Pt and son discussed d/c options back to home to have HHPT in order for pt to remain active once d/c.  Both agreed to plan and with assistance from son next door, d/c home with HHPT is appropriate.    Follow Up Recommendations Home health PT    Equipment Recommendations  None recommended by PT    Recommendations for Other Services       Precautions / Restrictions Precautions Precautions:  None Restrictions Weight Bearing Restrictions: No      Mobility  Bed Mobility Overal bed mobility: Modified Independent             General bed mobility comments: Pt is able to come upright into sitting position without use of handrails, however requires two attempts to come completely into sitting.    Transfers Overall transfer level: Needs assistance Equipment used: None Transfers: Sit to/from Stand Sit to Stand: Min guard         General transfer comment: Pt with good tehcnique in transfer to standing.  Pt requires CGA due to weakness in the LE, and minimal unsteadiness.  Ambulation/Gait Ambulation/Gait assistance: Min guard Gait Distance (Feet): 60 Feet Assistive device: None Gait Pattern/deviations: Step-through pattern Gait velocity: decreased   General Gait Details: Pt with minimal unsteadiness due to weakness in the LE's, however no LE buckling occurred.  Stairs            Wheelchair Mobility    Modified Rankin (Stroke Patients Only)       Balance Overall balance assessment: Mild deficits observed, not formally tested                                           Pertinent Vitals/Pain Pain Assessment: No/denies pain    Home Living Family/patient expects to be discharged to:: Private residence Living Arrangements: Alone;Other (Comment) Available Help at Discharge: Family Type of Home: House Home Access:  Stairs to enter Entrance Stairs-Rails: None Entrance Stairs-Number of Steps: 3 Home Layout: One level Home Equipment: Walker - 2 wheels;Cane - single point;Shower seat      Prior Function Level of Independence: Independent               Hand Dominance   Dominant Hand: Right    Extremity/Trunk Assessment   Upper Extremity Assessment Upper Extremity Assessment: Overall WFL for tasks assessed    Lower Extremity Assessment Lower Extremity Assessment: Generalized weakness       Communication    Communication: No difficulties  Cognition Arousal/Alertness: Awake/alert Behavior During Therapy: WFL for tasks assessed/performed Overall Cognitive Status: Within Functional Limits for tasks assessed                                 General Comments: HOH      General Comments      Exercises Total Joint Exercises Ankle Circles/Pumps: AROM;Strengthening;Both;10 reps;Supine Quad Sets: AROM;Strengthening;Both;10 reps;Supine Gluteal Sets: AROM;Strengthening;Both;10 reps;Supine Heel Slides: AROM;Strengthening;Both;10 reps;Supine Hip ABduction/ADduction: AROM;Strengthening;Both;10 reps;Supine Straight Leg Raises: AROM;Strengthening;Both;10 reps;Supine Marching in Standing: AROM;Strengthening;Both;10 reps;Supine Other Exercises Other Exercises: Pt and son educated on role of PT and the services provided during hospital stay.  Pt and son also educated on d/c options and were discussed throughout session.   Assessment/Plan    PT Assessment Patient needs continued PT services  PT Problem List Decreased strength;Decreased activity tolerance       PT Treatment Interventions Gait training;Therapeutic exercise;Therapeutic activities;Balance training;Neuromuscular re-education    PT Goals (Current goals can be found in the Care Plan section)  Acute Rehab PT Goals Patient Stated Goal: To go back home. PT Goal Formulation: With patient Time For Goal Achievement: 08/05/21 Potential to Achieve Goals: Good    Frequency Min 2X/week   Barriers to discharge   Pt has good caregiver support with brother and son living next door, however she does not want anyone to stay with her.    Co-evaluation               AM-PAC PT "6 Clicks" Mobility  Outcome Measure Help needed turning from your back to your side while in a flat bed without using bedrails?: A Little Help needed moving from lying on your back to sitting on the side of a flat bed without using bedrails?: A  Little Help needed moving to and from a bed to a chair (including a wheelchair)?: A Little Help needed standing up from a chair using your arms (e.g., wheelchair or bedside chair)?: A Little Help needed to walk in hospital room?: A Little Help needed climbing 3-5 steps with a railing? : A Little 6 Click Score: 18    End of Session   Activity Tolerance: Patient tolerated treatment well Patient left: in bed;with call bell/phone within reach;with family/visitor present Nurse Communication: Mobility status PT Visit Diagnosis: Unsteadiness on feet (R26.81);Muscle weakness (generalized) (M62.81)    Time: [id number] PT Time Calculation (min) (ACUTE ONLY): 35 min   Charges:   PT Evaluation $PT Eval Low Complexity: 1 Low PT Treatments $Gait Training: 8-22 mins $Therapeutic Exercise: 8-22 mins        Gwenlyn Saran, PT, DPT 07/22/21, 12:40 PM   Christie Nottingham 07/22/2021, 12:35 PM

## 2021-07-22 NOTE — Progress Notes (Signed)
PROGRESS NOTE  Krystal Richardson  DOB: 12-30-1928  PCP: Tracie Harrier, MD [id number]  DOA: 07/21/2021  LOS: 0 days  Hospital Day: 2   Chief Complaint  Patient presents with   Nausea    Brief narrative: Krystal Richardson is a 87 y.o. female with PMH significant for hyperlipidemia, hypothyroidism, basal cell carcinoma, melanoma (s/p of radiation therapy), gastric ulcer disease, thyroid nodules, hard of hearing who lives at home with son and was sent to the ED by PCP for failure to thrive, with 16 pounds weight loss in 2 weeks since passing of her husband  In the ED, patient was hemodynamically stable Labs with unremarkable CBC, sodium level low at 125, potassium low at 2.9, troponin elevated to 71, serum osmolality low at 268, urine osmolality low at 254 COVID PCR positive. Patient was kept in observation under hospitalist service.  Subjective: Patient was seen and examined this morning.  Pleasant elderly Caucasian female.  Lying down in bed.  Hard of hearing.  Her son was at bedside.  Patient walked in the room today with physical therapy.  Assessment/Plan: Failure to thrive Significant weight loss Generalized weakness -Reported 16 pounds weight loss in 2 weeks after her husband passed away. -Significant electrolyte abnormalities -PT eval appreciated.  Home with PT recommended..  Nutrition consult requested -Continue normal saline for next 24 hours  COVID-19 virus infection -Currently not in respiratory distress.  Started on a short course of IV remdesivir. -As needed bronchodilators and Mucinex.  Acute hypovolemic hyponatremia -Presented with a low sodium level of 125 -Low serum osmolality as well as urine osmolality. -Currently on normal saline at 75 mill per hour.  Continue IV fluid for now Recent Labs  Lab 07/18/21 1440 07/21/21 1140 07/21/21 1552 07/21/21 2252 07/22/21 0726  NA 129* 125* 127* 127* 125*   Hypokalemia -Potassium level was low at 2.9.  Improving  with replacement.  3.3 this morning.  Oral replacement ordered again this morning.  Repeat potassium level tomorrow. Recent Labs  Lab 07/18/21 1440 07/21/21 1140 07/21/21 1404 07/21/21 1552 07/21/21 2252 07/22/21 0726  K 3.6 2.9*  --  3.2* 3.6 3.3*  MG  --   --  2.0  --   --   --   PHOS  --   --   --  2.8  --   --    Elevated troponin -Likely demand ischemia.  No active anginal symptoms.  Continue aspirin and statin Recent Labs    07/21/21 1552 07/21/21 1747 07/21/21 2252  TROPONINIHS 70* 58* 50*   HLD  -Lipitor 10 mg daily   Hypothyroidism -Continue Synthroid  Mobility: Walked with PT Code Status:   Code Status: DNR  Nutritional status: Body mass index is 17.96 kg/m.     Diet:  Diet Order             Diet regular Room service appropriate? Yes; Fluid consistency: Thin  Diet effective now                  DVT prophylaxis:  enoxaparin (LOVENOX) injection 40 mg Start: 07/21/21 2200   Antimicrobials: Remdesivir IV Fluid: Normal saline at 75 mill per hour Consultants: None Family Communication: Son at bedside  Status is: Inpatient  Remains inpatient appropriate because: Needs IV hydration  Dispo: The patient is from: Home              Anticipated d/c is to: Home with home health PT  Patient currently is not medically stable to d/c.   Difficult to place patient No     Infusions:   sodium chloride 75 mL/hr at 07/22/21 0730   remdesivir 100 mg in NS 100 mL 100 mg (07/22/21 1001)    Scheduled Meds:  aspirin EC  81 mg Oral Daily   atorvastatin  10 mg Oral Daily   enoxaparin (LOVENOX) injection  40 mg Subcutaneous Q24H   feeding supplement  237 mL Oral BID BM   levothyroxine  25 mcg Oral Q0600   multivitamin with minerals  1 tablet Oral Daily    Antimicrobials: Anti-infectives (From admission, onward)    Start     Dose/Rate Route Frequency Ordered Stop   07/22/21 1000  remdesivir 100 mg in sodium chloride 0.9 % 100 mL IVPB        See Hyperspace for full Linked Orders Report.   100 mg 200 mL/hr over 30 Minutes Intravenous Daily 07/21/21 1757 07/26/21 0959   07/21/21 2000  remdesivir 200 mg in sodium chloride 0.9% 250 mL IVPB       See Hyperspace for full Linked Orders Report.   200 mg 580 mL/hr over 30 Minutes Intravenous  Once 07/21/21 1757 07/21/21 2156       PRN meds: acetaminophen, albuterol, ALPRAZolam, dextromethorphan-guaiFENesin, hydrALAZINE, ondansetron (ZOFRAN) IV   Objective: Vitals:   07/22/21 0751 07/22/21 1224  BP: (!) 159/69 (!) 142/55  Pulse: 77 72  Resp: 15 16  Temp: 98 F (36.7 C) 97.8 F (36.6 C)  SpO2: 98% 100%    Intake/Output Summary (Last 24 hours) at 07/22/2021 1304 Last data filed at 07/22/2021 0300 Gross per 24 hour  Intake 775.05 ml  Output --  Net 775.05 ml   Filed Weights   07/21/21 1138  Weight: 46 kg   Weight change:  Body mass index is 17.96 kg/m.   Physical Exam: General exam: Pleasant, elderly Caucasian female.  Not in physical distress Skin: No rashes, lesions or ulcers. HEENT: Atraumatic, normocephalic, no obvious bleeding Lungs: Clear to auscultation bilaterally CVS: Regular rate and rhythm, no murmur GI/Abd soft, nontender, nondistended, bowel sound present CNS: Alert, awake, hard of hearing.  Oriented to place and person Psychiatry: Depressed look Extremities: No pedal edema, no calf tenderness  Data Review: I have personally reviewed the laboratory data and studies available.  Recent Labs  Lab 07/18/21 1440 07/21/21 1140  WBC 7.4 9.0  HGB 11.9* 13.1  HCT 33.3* 35.0*  MCV 86.0 82.5  PLT 195 242   Recent Labs  Lab 07/18/21 1440 07/21/21 1140 07/21/21 1404 07/21/21 1552 07/21/21 2252 07/22/21 0726  NA 129* 125*  --  127* 127* 125*  K 3.6 2.9*  --  3.2* 3.6 3.3*  CL 99 91*  --  94* 98 97*  CO2 20* 21*  --  21* 19* 19*  GLUCOSE 121* 110*  --  93 111* 94  BUN 15 26*  --  24* 22 19  CREATININE 0.57 0.89  --  0.85 0.72 0.43*  CALCIUM  9.1 9.5  --  8.7* 8.4* 8.2*  MG  --   --  2.0  --   --   --   PHOS  --   --   --  2.8  --   --     F/u labs ordered Unresulted Labs (From admission, onward)     Start     Ordered   07/23/21 0500  CBC with Differential/Platelet  Daily,   R  07/22/21 1304   07/23/21 [id number]  Basic metabolic panel  Daily,   R      07/22/21 1304   07/23/21 0500  Magnesium  Tomorrow morning,   R        07/22/21 1304   07/23/21 0500  Phosphorus  Tomorrow morning,   R        07/22/21 1304            Signed, Terrilee Croak, MD [facility] 07/22/2021

## 2021-07-22 NOTE — Evaluation (Signed)
Occupational Therapy Evaluation Patient Details Name: Krystal Richardson MRN: [id number] DOB: January 08, 1929 Today's Date: 07/22/2021    History of Present Illness 87 y.o. female with medical history significant of hyperlipidemia, hypothyroidism, basal cell carcinoma, melanoma (s/p of radiation therapy), gastric ulcer disease, thyroid nodules, hard of hearing, who present with failure to thrive. Per her son, patient has been unwell for the more than 2 weeks since the passing of her husband. Patient has a poor appetite, decreased oral intake.  Patient has nausea and dry heaves, no vomiting, diarrhea or abdominal pain.  No symptoms of UTI. Patient was was seen in ED2 days ago with same symptoms as well as shortness of breath but reports the shortness of breath has improved, however denies any improvement in poor appetite. Patient does not have chest pain, shortness of breath, cough, fever or chills.  She was referred from her PCP because she has had a 16 pound weight loss in the last 2 weeks. Found to be Covid-19+.   Clinical Impression   Pt was seen for OT evaluation this date. Prior to hospital admission, pt was independent, at home. Pt endorses her spouse recently passed away ~2 weeks ago and she misses him very much. Active listening and emotional support provided. Currently pt demonstrates mild impairments in dynamic balance, strength, and activity tolerance as described below (See OT problem list) which functionally limit her ability to perform ADL/self-care tasks. Pt currently requires SBA-CGA for ADL transfers and ADL mobility (balance improved with 2WW use, PRN VC for technique), remote supervision for toileting, and endorses 7/10 PRE after going to the bathroom and returning to bed. Pt performed toileting twice. First up to bathroom to urinate. Once returned to bed, endorsed feeling possible need to have bowel movement. After additional time spent in bathroom, pt ultimately was unsuccessful. RN notified, per  pt's request, for something to address constipation. HR in 90's with exertion. Pt educated in energy conservation strategies including activity pacing, work simplification, and importance of rest breaks to maximize safety/independence with ADL/IADL. Pt verbalized understanding. Denied SOB throughout. Pt would benefit from skilled OT services to address noted impairments and functional limitations (see below for any additional details) in order to maximize safety and independence while minimizing falls risk and caregiver burden. Upon hospital discharge, recommend HHOT to maximize pt safety and return to functional independence during meaningful occupations of daily life.     Follow Up Recommendations  Home health OT    Equipment Recommendations  None recommended by OT    Recommendations for Other Services       Precautions / Restrictions Precautions Precautions: Fall Restrictions Weight Bearing Restrictions: No      Mobility Bed Mobility Overal bed mobility: Modified Independent             General bed mobility comments: Pt is able to come upright into sitting position without use of handrails, however requires two attempts to come completely into sitting.    Transfers Overall transfer level: Needs assistance Equipment used: None;1 person hand held assist;Rolling walker (2 wheeled) Transfers: Sit to/from Stand Sit to Stand: Min guard         General transfer comment: does better wiht 2WW    Balance Overall balance assessment: Needs assistance Sitting-balance support: No upper extremity supported;Feet supported Sitting balance-Leahy Scale: Good     Standing balance support: Bilateral upper extremity supported;During functional activity;Single extremity supported Standing balance-Leahy Scale: Fair Standing balance comment: slight LOB with initial amb from EOB to bathroom, self corrects  with SBA, balance improves with 2WW                           ADL either  performed or assessed with clinical judgement   ADL Overall ADL's : Needs assistance/impaired                                       General ADL Comments: supervision to CGA for standing ADL, cues for rest breaks, PLB     Vision Patient Visual Report: No change from baseline       Perception     Praxis      Pertinent Vitals/Pain Pain Assessment: No/denies pain     Hand Dominance Right   Extremity/Trunk Assessment Upper Extremity Assessment Upper Extremity Assessment: Overall WFL for tasks assessed;Generalized weakness   Lower Extremity Assessment Lower Extremity Assessment: Generalized weakness       Communication Communication Communication: No difficulties   Cognition Arousal/Alertness: Awake/alert Behavior During Therapy: WFL for tasks assessed/performed Overall Cognitive Status: Within Functional Limits for tasks assessed                                 General Comments: HOH   General Comments       Exercises Other Exercises: Pt educated in PLB, energy conservation strategies, and ways to slowly increase actiity tolerance, educated in modified strategies to support ADL/IADL participation and safety.   Shoulder Instructions      Home Living Family/patient expects to be discharged to:: Private residence Living Arrangements: Alone;Other (Comment) Available Help at Discharge: Family Type of Home: House Home Access: Stairs to enter [employer] of Steps: 3 Entrance Stairs-Rails: None Home Layout: One level     Bathroom Shower/Tub: Walk-in shower         Home Equipment: Environmental consultant - 2 wheels;Cane - single point;Shower seat          Prior Functioning/Environment Level of Independence: Independent                 OT Problem List: Decreased activity tolerance;Impaired balance (sitting and/or standing);Decreased knowledge of use of DME or AE;Decreased strength      OT Treatment/Interventions: Self-care/ADL  training;Therapeutic exercise;Therapeutic activities;Energy conservation;DME and/or AE instruction;Patient/family education;Balance training    OT Goals(Current goals can be found in the care plan section) Acute Rehab OT Goals Patient Stated Goal: To go back home. OT Goal Formulation: With patient Time For Goal Achievement: 08/05/21 Potential to Achieve Goals: Good ADL Goals Pt Will Transfer to Toilet: with modified independence;ambulating (c LRAD PRN) Additional ADL Goal #1: Pt will perform morning ADL routine with modified independence, and PRE <6/10.  OT Frequency: Min 1X/week   Barriers to D/C:            Co-evaluation              AM-PAC OT "6 Clicks" Daily Activity     Outcome Measure Help from another person eating meals?: None Help from another person taking care of personal grooming?: None Help from another person toileting, which includes using toliet, bedpan, or urinal?: A Little Help from another person bathing (including washing, rinsing, drying)?: A Little Help from another person to put on and taking off regular upper body clothing?: None Help from another person to put on and taking off regular lower  body clothing?: A Little 6 Click Score: 21   End of Session Equipment Utilized During Treatment: Surveyor, mining Communication: Other (comment) (constipated)  Activity Tolerance: Patient tolerated treatment well Patient left: in bed;with call bell/phone within reach;with bed alarm set  OT Visit Diagnosis: Other abnormalities of gait and mobility (R26.89);Muscle weakness (generalized) (M62.81)                Time: [id number] OT Time Calculation (min): 41 min Charges:  OT General Charges $OT Visit: 1 Visit OT Evaluation $OT Eval Moderate Complexity: 1 Mod OT Treatments $Self Care/Home Management : 23-37 mins  Hanley Hays, MPH, MS, OTR/L ascom [phone] 07/22/21, 2:53 PM

## 2021-07-22 NOTE — Progress Notes (Signed)
Initial Nutrition Assessment  DOCUMENTATION CODES:  Underweight, Non-severe (moderate) malnutrition in context of acute illness/injury  INTERVENTION:  Liberalize diet to regular for advanced age, underweight BMI, and COVID19 Ensure Enlive po BID, each supplement provides 350 kcal and 20 grams of protein MVI with minerals daily Magic cup TID with meals, each supplement provides 290 kcal and 9 grams of protein  NUTRITION DIAGNOSIS:  Moderate Malnutrition related to acute illness (COVID19) as evidenced by mild fat depletion, mild muscle depletion, energy intake < 75% for > 7 days.  GOAL:  Patient will meet greater than or equal to 90% of their needs  MONITOR:  PO intake, Supplement acceptance, I & O's, Labs  REASON FOR ASSESSMENT:  Malnutrition Screening Tool (low BMI)    ASSESSMENT:  87 y.o. female sent to ED from PCP with GI distress, poor PO intake, and SOB. Patient and son state that she has been unwell for the last 2 weeks since the passing of her husband. PMH relevant for HLD, skin cancer, osteoporosis, and hypothyroidism  Pt found to be COVID19+ in ED. At baseline, lives at home alone, family lives nearby.   Pt napping at the time of visit, wakes easily to voice. Pt reports that she has been eating less the last few weeks, but did eat some of her lunch today. Denies GI pain but states she does feel constipated and requests medication. Will mention to MD.  Discussed her increased needs with COVID19, pt agreeable to ensure and does think strawberry would be her preferred flavor. Also inquired about weight loss. Bed weight at ~100 lbs. Pt states she is unsure what she normally weighs, but was told she lost almost 20 lbs prior to admission. Reviewed PCP records, usual weight appears to be at ~102-105 lbs. Likely weight loss on admission was related to dehydration. Encouraged pt to continue eating as much as she could and supplementing the the ensures. Pt agreeable.   Nutritionally  Relevant Medications: Scheduled Meds:  atorvastatin  10 mg Oral Daily   Continuous Infusions:  sodium chloride 75 mL/hr at 07/22/21 0730   remdesivir 100 mg in NS 100 mL     PRN Meds: ondansetron  Labs Reviewed: Na 125 K 3.3 SBG ranges from 93-111 mg/dL over the last 24 hours HgbA1c 6.2% (7/27)  NUTRITION - FOCUSED PHYSICAL EXAM: Flowsheet Row Most Recent Value  Orbital Region Mild depletion  Upper Arm Region No depletion  Thoracic and Lumbar Region Mild depletion  Buccal Region Mild depletion  Temple Region Mild depletion  Clavicle Bone Region Moderate depletion  Clavicle and Acromion Bone Region Severe depletion  Scapular Bone Region Severe depletion  Dorsal Hand Moderate depletion  Patellar Region Severe depletion  Anterior Thigh Region Severe depletion  Posterior Calf Region Severe depletion  Edema (RD Assessment) None  Hair Reviewed  Eyes Reviewed  Mouth Reviewed  Skin Reviewed  Nails Reviewed   Diet Order:   Diet Order             Diet regular Room service appropriate? Yes; Fluid consistency: Thin  Diet effective now                   EDUCATION NEEDS:  No education needs have been identified at this time  Skin:  Skin Assessment: Reviewed RN Assessment  Last BM:  PTA  Height:  Ht Readings from Last 1 Encounters:  07/21/21 '5\' 3"'$  (1.6 m)    Weight:  Wt Readings from Last 1 Encounters:  07/21/21 46 kg  Ideal Body Weight:  52.3 kg  BMI:  Body mass index is 17.96 kg/m.  Estimated Nutritional Needs:  Kcal:  1300-1500 kcal/d Protein:  70-85 g/d Fluid:  >1572m/d   RRanell Patrick RD, LDN Clinical Dietitian Pager on [place] [place]

## 2021-07-22 NOTE — Progress Notes (Signed)
Tele continue per MD order

## 2021-07-23 ENCOUNTER — Inpatient Hospital Stay: Payer: Medicare Other

## 2021-07-23 DIAGNOSIS — R627 Adult failure to thrive: Secondary | ICD-10-CM | POA: Diagnosis not present

## 2021-07-23 DIAGNOSIS — E440 Moderate protein-calorie malnutrition: Secondary | ICD-10-CM | POA: Insufficient documentation

## 2021-07-23 LAB — CBC WITH DIFFERENTIAL/PLATELET
Abs Immature Granulocytes: 0.05 10*3/uL (ref 0.00–0.07)
Basophils Absolute: 0 10*3/uL (ref 0.0–0.1)
Basophils Relative: 1 %
Eosinophils Absolute: 0.1 10*3/uL (ref 0.0–0.5)
Eosinophils Relative: 1 %
HCT: 32.8 % — ABNORMAL LOW (ref 36.0–46.0)
Hemoglobin: 11.7 g/dL — ABNORMAL LOW (ref 12.0–15.0)
Immature Granulocytes: 1 %
Lymphocytes Relative: 23 %
Lymphs Abs: 1.6 10*3/uL (ref 0.7–4.0)
MCH: 30.4 pg (ref 26.0–34.0)
MCHC: 35.7 g/dL (ref 30.0–36.0)
MCV: 85.2 fL (ref 80.0–100.0)
Monocytes Absolute: 0.8 10*3/uL (ref 0.1–1.0)
Monocytes Relative: 11 %
Neutro Abs: 4.4 10*3/uL (ref 1.7–7.7)
Neutrophils Relative %: 63 %
Platelets: 238 10*3/uL (ref 150–400)
RBC: 3.85 MIL/uL — ABNORMAL LOW (ref 3.87–5.11)
RDW: 14.4 % (ref 11.5–15.5)
WBC: 6.9 10*3/uL (ref 4.0–10.5)
nRBC: 0 % (ref 0.0–0.2)

## 2021-07-23 LAB — MAGNESIUM: Magnesium: 1.8 mg/dL (ref 1.7–2.4)

## 2021-07-23 LAB — GLUCOSE, CAPILLARY
Glucose-Capillary: 90 mg/dL (ref 70–99)
Glucose-Capillary: 165 mg/dL — ABNORMAL HIGH (ref 70–99)

## 2021-07-23 LAB — BASIC METABOLIC PANEL
Anion gap: 4 — ABNORMAL LOW (ref 5–15)
BUN: 21 mg/dL (ref 8–23)
CO2: 22 mmol/L (ref 22–32)
Calcium: 8.2 mg/dL — ABNORMAL LOW (ref 8.9–10.3)
Chloride: 100 mmol/L (ref 98–111)
Creatinine, Ser: 0.48 mg/dL (ref 0.44–1.00)
GFR, Estimated: >60 mL/min (ref >60)
Glucose, Bld: 89 mg/dL (ref 70–99)
Potassium: 3.8 mmol/L (ref 3.5–5.1)
Sodium: 126 mmol/L — ABNORMAL LOW (ref 135–145)

## 2021-07-23 LAB — PHOSPHORUS: Phosphorus: 2.1 mg/dL — ABNORMAL LOW (ref 2.5–4.6)

## 2021-07-23 MED ORDER — DULOXETINE HCL 30 MG PO CPEP
30.0000 mg | ORAL_CAPSULE | Freq: Every day | ORAL | Status: DC
  Administered 2021-07-23 – 2021-07-26 (×4): 30 mg via ORAL
  Filled 2021-07-23 (×4): qty 1

## 2021-07-23 MED ORDER — ONDANSETRON HCL 4 MG/2ML IJ SOLN: 4.0000 mg | Freq: Two times a day (BID) | INTRAMUSCULAR | Status: DC

## 2021-07-23 MED ORDER — MIRTAZAPINE 15 MG PO TBDP
15.0000 mg | ORAL_TABLET | Freq: Every day | ORAL | Status: DC
  Administered 2021-07-23 – 2021-07-25 (×3): 15 mg via ORAL
  Filled 2021-07-23 (×4): qty 1

## 2021-07-23 MED ORDER — IOHEXOL 350 MG/ML SOLN
100.0000 mL | Freq: Once | INTRAVENOUS | Status: AC | PRN
  Administered 2021-07-23: 16:00:00 60 mL via INTRAVENOUS

## 2021-07-23 MED ORDER — SODIUM PHOSPHATES 45 MMOLE/15ML IV SOLN
30.0000 mmol | Freq: Once | INTRAVENOUS | Status: AC
  Administered 2021-07-23: 10:00:00 30 mmol via INTRAVENOUS
  Filled 2021-07-23: qty 10

## 2021-07-23 MED ORDER — ONDANSETRON HCL 4 MG/2ML IJ SOLN
4.0000 mg | Freq: Three times a day (TID) | INTRAMUSCULAR | Status: DC
  Administered 2021-07-23 – 2021-07-26 (×9): 4 mg via INTRAVENOUS
  Filled 2021-07-23 (×9): qty 2

## 2021-07-23 NOTE — Progress Notes (Addendum)
PROGRESS NOTE  Krystal Richardson  DOB: July 24, 1929  PCP: Tracie Harrier, MD JM:[id number]  DOA: 07/21/2021  LOS: 1 day  Hospital Day: 3   Chief Complaint  Patient presents with   Nausea    Brief narrative: Krystal Richardson is a 87 y.o. female with PMH significant for hyperlipidemia, hypothyroidism, basal cell carcinoma, melanoma (s/p of radiation therapy), gastric ulcer disease, thyroid nodules, hard of hearing who lives at home with son and was sent to the ED by PCP for failure to thrive, with 16 pounds weight loss in 2 weeks since passing of her husband  In the ED, patient was hemodynamically stable Labs with unremarkable CBC, sodium level low at 125, potassium low at 2.9, troponin elevated to 71, serum osmolality low at 268, urine osmolality low at 254 COVID PCR positive. Patient was kept in observation under hospitalist service.  Subjective: Patient was seen and examined this morning.  Patient looks depressed and lethargic today.  Son at bedside.  Was excited with her improvement yesterday but today she had some significant setback.  She did not want to eat anything for breakfast.  He also mentions to me today that she has been struggling with persistent nausea for last several days.   Assessment/Plan: Failure to thrive Moderate malnutrition Significant weight loss Generalized weakness -Reported 16 pounds weight loss in 2 weeks after her husband passed away. -Significant electrolyte abnormalities -PT eval appreciated.  Home with PT recommended..  Nutrition consult requested -Currently on normal saline.  Intractable nausea -Patient son reports that patient has significant nausea for last several days which is strongly contributing to her loss of appetite and malnutrition. -We will put on scheduled Zofran.  She also mentioned to him earlier about abdominal tenderness.  He is not aware of any gastric ulcers or abdominal pathology in the past. -CT abdomen with oral contrast  ordered.  Grief/depression -Husband passed away 2 weeks ago after which she had significant decline.  She is in a state of grief.  We will try Cymbalta 20 mg daily and Remeron 15 mg nightly to help for depression as well as appetite.  COVID-19 virus infection -Currently not in respiratory distress.  Started on a short course of IV remdesivir. -As needed bronchodilators and Mucinex.  Acute hypovolemic hyponatremia -Presented with a low sodium level of 125 -Low serum osmolality as well as urine osmolality. -Currently on normal saline at 75 mill per hour.  Continue IV fluid for now -Sodium level improved to 126 today. Recent Labs  Lab 07/18/21 1440 07/21/21 1140 07/21/21 1552 07/21/21 2252 07/22/21 0726 07/23/21 0446  NA 129* 125* 127* 127* 125* 126*  Hypokalemia/hypophosphatemia -Potassium level improved with replacement.  Phosphorus level was low at 2.1 today.  Replacement ordered. Recent Labs  Lab 07/21/21 1140 07/21/21 1404 07/21/21 1552 07/21/21 2252 07/22/21 0726 07/23/21 0446  K 2.9*  --  3.2* 3.6 3.3* 3.8  MG  --  2.0  --   --   --  1.8  PHOS  --   --  2.8  --   --  2.1*  Elevated troponin -Likely demand ischemia.  No active anginal symptoms.  Continue aspirin and statin Recent Labs    07/21/21 1552 07/21/21 1747 07/21/21 2252  TROPONINIHS 70* 58* 50*   HLD  -Lipitor 10 mg daily   Hypothyroidism -Continue Synthroid  Mobility: Walked with PT Code Status:   Code Status: DNR  Nutritional status: Body mass index is 17.96 kg/m. Nutrition Problem: Moderate Malnutrition Etiology: acute  illness (COVID19) Signs/Symptoms: mild fat depletion, mild muscle depletion, energy intake < 75% for > 7 days Diet:  Diet Order             Diet regular Room service appropriate? Yes; Fluid consistency: Thin  Diet effective now                  DVT prophylaxis:  enoxaparin (LOVENOX) injection 40 mg Start: 07/21/21 2200   Antimicrobials: Remdesivir IV Fluid:  Normal saline at 75 mill per hour Consultants: None Family Communication: Son at bedside  Status is: Inpatient  Remains inpatient appropriate because: Needs IV hydration  Dispo: The patient is from: Home              Anticipated d/c is to: Son wants to look for SNF.              Patient currently is not medically stable to d/c.   Difficult to place patient No     Infusions:   sodium chloride Stopped (07/23/21 1000)   remdesivir 100 mg in NS 100 mL 100 mg (07/23/21 0811)   sodium phosphate  Dextrose 5% IVPB 30 mmol (07/23/21 0930)    Scheduled Meds:  aspirin EC  81 mg Oral Daily   atorvastatin  10 mg Oral Daily   DULoxetine  30 mg Oral Daily   enoxaparin (LOVENOX) injection  40 mg Subcutaneous Q24H   feeding supplement  237 mL Oral BID BM   levothyroxine  25 mcg Oral Q0600   mirtazapine  15 mg Oral QHS   multivitamin with minerals  1 tablet Oral Daily   ondansetron (ZOFRAN) IV  4 mg Intravenous Q8H   senna-docusate  1 tablet Oral QHS    Antimicrobials: Anti-infectives (From admission, onward)    Start     Dose/Rate Route Frequency Ordered Stop   07/22/21 1000  remdesivir 100 mg in sodium chloride 0.9 % 100 mL IVPB       See Hyperspace for full Linked Orders Report.   100 mg 200 mL/hr over 30 Minutes Intravenous Daily 07/21/21 1757 07/26/21 0959   07/21/21 2000  remdesivir 200 mg in sodium chloride 0.9% 250 mL IVPB       See Hyperspace for full Linked Orders Report.   200 mg 580 mL/hr over 30 Minutes Intravenous  Once 07/21/21 1757 07/21/21 2156       PRN meds: acetaminophen, albuterol, ALPRAZolam, dextromethorphan-guaiFENesin, hydrALAZINE, polyethylene glycol   Objective: Vitals:   07/23/21 0749 07/23/21 1100  BP: (!) 146/74 (!) 142/59  Pulse: 88 93  Resp: 16 16  Temp: 98.5 F (36.9 C) 98.7 F (37.1 C)  SpO2: 99% 100%    Intake/Output Summary (Last 24 hours) at 07/23/2021 1508 Last data filed at 07/23/2021 0600 Gross per 24 hour  Intake 2053.19 ml   Output --  Net 2053.19 ml   Filed Weights   07/21/21 1138  Weight: 46 kg   Weight change:  Body mass index is 17.96 kg/m.   Physical Exam: General exam: Pleasant, elderly Caucasian female.  Not in physical distress Skin: No rashes, lesions or ulcers. HEENT: Atraumatic, normocephalic, no obvious bleeding Lungs: Clear to auscultation bilaterally CVS: Regular rate and rhythm, no murmur GI/Abd soft, nontender, nondistended, bowel sound present CNS: Alert, awake, hard of hearing.  Oriented to place and person Psychiatry: Depressed look this morning. Extremities: No pedal edema, no calf tenderness  Data Review: I have personally reviewed the laboratory data and studies available.  Recent Labs  Lab 07/18/21 1440 07/21/21 1140 07/23/21 0446  WBC 7.4 9.0 6.9  NEUTROABS  --   --  4.4  HGB 11.9* 13.1 11.7*  HCT 33.3* 35.0* 32.8*  MCV 86.0 82.5 85.2  PLT 195 242 238   Recent Labs  Lab 07/21/21 1140 07/21/21 1404 07/21/21 1552 07/21/21 2252 07/22/21 0726 07/23/21 0446  NA 125*  --  127* 127* 125* 126*  K 2.9*  --  3.2* 3.6 3.3* 3.8  CL 91*  --  94* 98 97* 100  CO2 21*  --  21* 19* 19* 22  GLUCOSE 110*  --  93 111* 94 89  BUN 26*  --  24* '22 19 21  '$ CREATININE 0.89  --  0.85 0.72 0.43* 0.48  CALCIUM 9.5  --  8.7* 8.4* 8.2* 8.2*  MG  --  2.0  --   --   --  1.8  PHOS  --   --  2.8  --   --  2.1*    F/u labs ordered Unresulted Labs (From admission, onward)     Start     Ordered   07/23/21 0500  CBC with Differential/Platelet  Daily,   R      07/22/21 1304   07/23/21 [id number]  Basic metabolic panel  Daily,   R      07/22/21 1304            Signed, Terrilee Croak, MD [facility] 07/23/2021

## 2021-07-23 NOTE — TOC Progression Note (Signed)
Transition of Care [facility]) - Progression Note    Patient Details  Name: Krystal Richardson MRN: [id number] Date of Birth: 05/09/1929  [facility] of [facility] CM/SW [place], RN Phone Number: 07/23/2021, 3:24 PM  Clinical Narrative:   Patient cannot discharge today, as she is too weak.  Spoke to son, Heath Lark and he states his mother is heartbroken over the recent death of her spouse and is very weak and not eating.  He does not feel it appropriate for patient to return home at this point, and will explore SNF.  He states he will speak to his brother [name] to explore [facility] and future care.  RNCM introduced palliative care to son, he states he will speak to his brother, and if they agree, they will speak to provider about a consult.  In the event patient is able to go home with [place], the coordinator at [facility] has patient information.    TOC contact information given to patient and family, TOC to follow to discharge.         Expected Discharge Plan and Services                                                 Social Determinants of Health (SDOH) Interventions    Readmission Risk Interventions No flowsheet data found.

## 2021-07-24 DIAGNOSIS — R627 Adult failure to thrive: Secondary | ICD-10-CM | POA: Diagnosis not present

## 2021-07-24 LAB — BASIC METABOLIC PANEL
Anion gap: 5 (ref 5–15)
BUN: 19 mg/dL (ref 8–23)
CO2: 24 mmol/L (ref 22–32)
Calcium: 7.9 mg/dL — ABNORMAL LOW (ref 8.9–10.3)
Chloride: 96 mmol/L — ABNORMAL LOW (ref 98–111)
Creatinine, Ser: 0.43 mg/dL — ABNORMAL LOW (ref 0.44–1.00)
GFR, Estimated: >60 mL/min (ref >60)
Glucose, Bld: 112 mg/dL — ABNORMAL HIGH (ref 70–99)
Potassium: 3.1 mmol/L — ABNORMAL LOW (ref 3.5–5.1)
Sodium: 125 mmol/L — ABNORMAL LOW (ref 135–145)

## 2021-07-24 LAB — CBC WITH DIFFERENTIAL/PLATELET
Abs Immature Granulocytes: 0.05 10*3/uL (ref 0.00–0.07)
Basophils Absolute: 0 10*3/uL (ref 0.0–0.1)
Basophils Relative: 1 %
Eosinophils Absolute: 0 10*3/uL (ref 0.0–0.5)
Eosinophils Relative: 0 %
HCT: 32.4 % — ABNORMAL LOW (ref 36.0–46.0)
Hemoglobin: 11.9 g/dL — ABNORMAL LOW (ref 12.0–15.0)
Immature Granulocytes: 1 %
Lymphocytes Relative: 18 %
Lymphs Abs: 1.1 10*3/uL (ref 0.7–4.0)
MCH: 31.2 pg (ref 26.0–34.0)
MCHC: 36.7 g/dL — ABNORMAL HIGH (ref 30.0–36.0)
MCV: 85 fL (ref 80.0–100.0)
Monocytes Absolute: 0.8 10*3/uL (ref 0.1–1.0)
Monocytes Relative: 12 %
Neutro Abs: 4.3 10*3/uL (ref 1.7–7.7)
Neutrophils Relative %: 68 %
Platelets: 240 10*3/uL (ref 150–400)
RBC: 3.81 MIL/uL — ABNORMAL LOW (ref 3.87–5.11)
RDW: 14.5 % (ref 11.5–15.5)
WBC: 6.3 10*3/uL (ref 4.0–10.5)
nRBC: 0 % (ref 0.0–0.2)

## 2021-07-24 LAB — GLUCOSE, CAPILLARY
Glucose-Capillary: 106 mg/dL — ABNORMAL HIGH (ref 70–99)
Glucose-Capillary: 141 mg/dL — ABNORMAL HIGH (ref 70–99)

## 2021-07-24 MED ORDER — MEGESTROL ACETATE 20 MG PO TABS
40.0000 mg | ORAL_TABLET | Freq: Every day | ORAL | Status: DC
  Administered 2021-07-24 – 2021-07-26 (×3): 40 mg via ORAL
  Filled 2021-07-24 (×3): qty 2

## 2021-07-24 MED ORDER — SODIUM CHLORIDE 1 G PO TABS
1.0000 g | ORAL_TABLET | Freq: Three times a day (TID) | ORAL | Status: DC
  Administered 2021-07-24 – 2021-07-26 (×7): 1 g via ORAL
  Filled 2021-07-24 (×6): qty 1

## 2021-07-24 MED ORDER — POTASSIUM CHLORIDE CRYS ER 20 MEQ PO TBCR
40.0000 meq | EXTENDED_RELEASE_TABLET | Freq: Once | ORAL | Status: AC
  Administered 2021-07-24: 09:00:00 40 meq via ORAL
  Filled 2021-07-24: qty 2

## 2021-07-24 NOTE — Progress Notes (Signed)
Called to room by patient's son, who is at bedside. When entering room son promptly leaves room. Patient stand by assist with walker to bathroom. Gait steady.

## 2021-07-24 NOTE — Progress Notes (Addendum)
PROGRESS NOTE  Krystal Richardson  DOB: [birth year]  PCP: Tracie Harrier, MD JM:[id number]  DOA: 07/21/2021  LOS: 2 days  Hospital Day: 4   Chief Complaint  Patient presents with   Nausea    Brief narrative: Krystal Richardson is a 87 y.o. female with PMH significant for hyperlipidemia, hypothyroidism, basal cell carcinoma, melanoma (s/p of radiation therapy), gastric ulcer disease, thyroid nodules, hard of hearing who lives at home with son and was sent to the ED by PCP for failure to thrive, with 16 pounds weight loss in 2 weeks since passing of her husband  In the ED, patient was hemodynamically stable Labs with unremarkable CBC, sodium level low at 125, potassium low at 2.9, troponin elevated to 71, serum osmolality low at 268, urine osmolality low at 254 COVID PCR positive. Patient was kept in observation under hospitalist service.  Subjective: Patient was seen and examined this morning.  Alert, awake, looks depressed.  We had a conversation about her poor appetite.  She states he gets nauseated every time she sees food.  It seems a lot of this is related to her current grief of losing her husband 2 weeks ago.  Patient stated to me today that she would try her best to eat today.  Assessment/Plan: Failure to thrive Moderate malnutrition Significant weight loss Generalized weakness -Reported 16 pounds weight loss in 2 weeks after her husband passed away. -Significant electrolyte abnormalities -PT eval was obtained. Nutrition consult appreciated. -Currently on normal saline.  I will reduce the rate -Also started on Remeron and Megace to boost appetite.  Intractable nausea -Presented with significant nausea for several days prior to presentation which is strongly contributing to her loss of appetite and malnutrition. -Currently on scheduled Zofran -7/20 and, CT abdomen pelvis did not show any acute intra-abdominal pathology. -CT abdomen with oral contrast  ordered.  Grief/depression -Husband passed away 2 weeks ago after which she had significant decline.  She is in a state of grief.  -Started on Cymbalta 20 mg daily.    COVID-19 virus infection -Currently not in respiratory distress.  Started on a short course of IV remdesivir. -As needed bronchodilators and Mucinex.  Acute hypovolemic hyponatremia -Presented with a low sodium level of 125 -Low serum osmolality as well as urine osmolality. -Currently on normal saline.  -Sodium level remains low at 125.  I added sodium chloride tablet 1 g 3 times daily today. Recent Labs  Lab 07/18/21 1440 07/21/21 1140 07/21/21 1552 07/21/21 2252 07/22/21 0726 07/23/21 0446 07/24/21 0635  NA 129* 125* 127* 127* 125* 126* 125*   Hypokalemia/hypophosphatemia -Potassium level improved with replacement.  Phosphorus level was low at 2.1 yesterday.  Replacement given.  Recheck labs tomorrow. Recent Labs  Lab 07/21/21 1404 07/21/21 1552 07/21/21 2252 07/22/21 0726 07/23/21 0446 07/24/21 0635  K  --  3.2* 3.6 3.3* 3.8 3.1*  MG 2.0  --   --   --  1.8  --   PHOS  --  2.8  --   --  2.1*  --    Elevated troponin -Likely demand ischemia.  No active anginal symptoms.  Continue aspirin and statin Recent Labs    07/21/21 1552 07/21/21 1747 07/21/21 2252  TROPONINIHS 70* 58* 50*   HLD  -Lipitor 10 mg daily   Hypothyroidism -Continue Synthroid  Mobility: Walked with PT Code Status:   Code Status: DNR  Nutritional status: Body mass index is 17.96 kg/m. Nutrition Problem: Moderate Malnutrition Etiology: acute illness (COVID19) Signs/Symptoms:  mild fat depletion, mild muscle depletion, energy intake < 75% for > 7 days Diet:  Diet Order             Diet regular Room service appropriate? Yes; Fluid consistency: Thin  Diet effective now                  DVT prophylaxis:  enoxaparin (LOVENOX) injection 40 mg Start: 07/21/21 2200   Antimicrobials: Remdesivir IV Fluid: Normal saline  at 50 mill per hour Consultants: None  Family Communication: called and updated patient's son Mr. [name]  Status is: Inpatient  Remains inpatient appropriate because: Poor appetite, may need placement.  Dispo: The patient is from: Home              Anticipated d/c is to: Son wants to look for SNF.              Patient currently is not medically stable to d/c.   Difficult to place patient No     Infusions:   sodium chloride 75 mL/hr at 07/24/21 0112   remdesivir 100 mg in NS 100 mL 100 mg (07/24/21 0930)    Scheduled Meds:  aspirin EC  81 mg Oral Daily   atorvastatin  10 mg Oral Daily   DULoxetine  30 mg Oral Daily   enoxaparin (LOVENOX) injection  40 mg Subcutaneous Q24H   feeding supplement  237 mL Oral BID BM   levothyroxine  25 mcg Oral Q0600   megestrol  40 mg Oral Daily   mirtazapine  15 mg Oral QHS   multivitamin with minerals  1 tablet Oral Daily   ondansetron (ZOFRAN) IV  4 mg Intravenous Q8H   senna-docusate  1 tablet Oral QHS   sodium chloride  1 g Oral TID WC    Antimicrobials: Anti-infectives (From admission, onward)    Start     Dose/Rate Route Frequency Ordered Stop   07/22/21 1000  remdesivir 100 mg in sodium chloride 0.9 % 100 mL IVPB       See Hyperspace for full Linked Orders Report.   100 mg 200 mL/hr over 30 Minutes Intravenous Daily 07/21/21 1757 07/26/21 0959   07/21/21 2000  remdesivir 200 mg in sodium chloride 0.9% 250 mL IVPB       See Hyperspace for full Linked Orders Report.   200 mg 580 mL/hr over 30 Minutes Intravenous  Once 07/21/21 1757 07/21/21 2156       PRN meds: acetaminophen, albuterol, ALPRAZolam, dextromethorphan-guaiFENesin, hydrALAZINE, polyethylene glycol   Objective: Vitals:   07/24/21 0733 07/24/21 1151  BP: (!) 174/74 (!) 150/72  Pulse: 86 80  Resp: 16 18  Temp: (!) 97.1 F (36.2 C) 98 F (36.7 C)  SpO2: 100% 99%    Intake/Output Summary (Last 24 hours) at 07/24/2021 1342 Last data filed at 07/24/2021  1030 Gross per 24 hour  Intake 1679.76 ml  Output --  Net 1679.76 ml   Filed Weights   07/21/21 1138  Weight: 46 kg   Weight change:  Body mass index is 17.96 kg/m.   Physical Exam: General exam: Pleasant, elderly Caucasian female. Not in physical distress Skin: No rashes, lesions or ulcers. HEENT: Atraumatic, normocephalic, no obvious bleeding Lungs: Clear to auscultation bilaterally CVS: Regular rate and rhythm, no murmur GI/Abd soft, nontender, nondistended, bowel sound present CNS: Alert, awake, hard of hearing.  Slow to respond but oriented to place and person Psychiatry: Depressed look Extremities: No pedal edema, no calf tenderness  Data Review:  I have personally reviewed the laboratory data and studies available.  Recent Labs  Lab 07/18/21 1440 07/21/21 1140 07/23/21 0446 07/24/21 0635  WBC 7.4 9.0 6.9 6.3  NEUTROABS  --   --  4.4 4.3  HGB 11.9* 13.1 11.7* 11.9*  HCT 33.3* 35.0* 32.8* 32.4*  MCV 86.0 82.5 85.2 85.0  PLT 195 242 238 240   Recent Labs  Lab 07/21/21 1404 07/21/21 1552 07/21/21 2252 07/22/21 0726 07/23/21 0446 07/24/21 0635  NA  --  127* 127* 125* 126* 125*  K  --  3.2* 3.6 3.3* 3.8 3.1*  CL  --  94* 98 97* 100 96*  CO2  --  21* 19* 19* 22 24  GLUCOSE  --  93 111* 94 89 112*  BUN  --  24* '22 19 21 19  '$ CREATININE  --  0.85 0.72 0.43* 0.48 0.43*  CALCIUM  --  8.7* 8.4* 8.2* 8.2* 7.9*  MG 2.0  --   --   --  1.8  --   PHOS  --  2.8  --   --  2.1*  --     F/u labs ordered Unresulted Labs (From admission, onward)     Start     Ordered   07/25/21 0500  Phosphorus  Tomorrow morning,   R        07/24/21 0750   07/23/21 0500  CBC with Differential/Platelet  Daily,   R      07/22/21 1304   07/23/21 [id number]  Basic metabolic panel  Daily,   R      07/22/21 1304            Signed, Terrilee Croak, MD [facility] 07/24/2021

## 2021-07-24 NOTE — Progress Notes (Signed)
Patient's son notified patient moved from room 120 to room 114 due to maintenance issues

## 2021-07-25 DIAGNOSIS — R627 Adult failure to thrive: Secondary | ICD-10-CM | POA: Diagnosis not present

## 2021-07-25 LAB — BASIC METABOLIC PANEL
Anion gap: 5 (ref 5–15)
BUN: 14 mg/dL (ref 8–23)
CO2: 23 mmol/L (ref 22–32)
Calcium: 8.1 mg/dL — ABNORMAL LOW (ref 8.9–10.3)
Chloride: 96 mmol/L — ABNORMAL LOW (ref 98–111)
Creatinine, Ser: 0.49 mg/dL (ref 0.44–1.00)
GFR, Estimated: >60 mL/min (ref >60)
Glucose, Bld: 100 mg/dL — ABNORMAL HIGH (ref 70–99)
Potassium: 3.5 mmol/L (ref 3.5–5.1)
Sodium: 124 mmol/L — ABNORMAL LOW (ref 135–145)

## 2021-07-25 LAB — CBC WITH DIFFERENTIAL/PLATELET
Abs Immature Granulocytes: 0.03 10*3/uL (ref 0.00–0.07)
Basophils Absolute: 0 10*3/uL (ref 0.0–0.1)
Basophils Relative: 0 %
Eosinophils Absolute: 0.1 10*3/uL (ref 0.0–0.5)
Eosinophils Relative: 1 %
HCT: 32.8 % — ABNORMAL LOW (ref 36.0–46.0)
Hemoglobin: 11.9 g/dL — ABNORMAL LOW (ref 12.0–15.0)
Immature Granulocytes: 0 %
Lymphocytes Relative: 22 %
Lymphs Abs: 1.5 10*3/uL (ref 0.7–4.0)
MCH: 30.9 pg (ref 26.0–34.0)
MCHC: 36.3 g/dL — ABNORMAL HIGH (ref 30.0–36.0)
MCV: 85.2 fL (ref 80.0–100.0)
Monocytes Absolute: 0.7 10*3/uL (ref 0.1–1.0)
Monocytes Relative: 11 %
Neutro Abs: 4.6 10*3/uL (ref 1.7–7.7)
Neutrophils Relative %: 66 %
Platelets: 266 10*3/uL (ref 150–400)
RBC: 3.85 MIL/uL — ABNORMAL LOW (ref 3.87–5.11)
RDW: 14.8 % (ref 11.5–15.5)
WBC: 7 10*3/uL (ref 4.0–10.5)
nRBC: 0 % (ref 0.0–0.2)

## 2021-07-25 LAB — PHOSPHORUS: Phosphorus: 2.3 mg/dL — ABNORMAL LOW (ref 2.5–4.6)

## 2021-07-25 LAB — GLUCOSE, CAPILLARY: Glucose-Capillary: 98 mg/dL (ref 70–99)

## 2021-07-25 MED ORDER — SODIUM PHOSPHATES 45 MMOLE/15ML IV SOLN
30.0000 mmol | Freq: Once | INTRAVENOUS | Status: AC
  Administered 2021-07-25: 09:00:00 30 mmol via INTRAVENOUS
  Filled 2021-07-25: qty 10

## 2021-07-25 MED ORDER — POTASSIUM & SODIUM PHOSPHATES 280-160-250 MG PO PACK
2.0000 | PACK | Freq: Three times a day (TID) | ORAL | Status: AC
  Administered 2021-07-25 – 2021-07-26 (×3): 2 via ORAL
  Filled 2021-07-25 (×3): qty 2

## 2021-07-25 NOTE — Progress Notes (Signed)
Chaplain responded to Spiritual Care consult to provide support to patient following the recent death of her husband, Thayer Jew.  Pt reported feeling good, better than in the past few days, although she remarked that she hasn't been hungry.  Chaplain provided active listening and emotional support as pt described her husband, how he died, and their routines during their 29 year marriage.  Morning is hardest.  Pt welcomed prayer.  Please contact as needed for ongoing support.   Minus Liberty, [place] [phone]    07/25/21 1300  Clinical Encounter Type  Visited With Patient  Visit Type Initial;Spiritual support;Social support;Psychological support  Referral From Chaplain  Consult/Referral To Chaplain  Spiritual Encounters  Spiritual Needs Prayer  Stress Factors  Patient Stress Factors Major life changes;Family relationships

## 2021-07-25 NOTE — Progress Notes (Signed)
PROGRESS NOTE  Krystal Richardson  DOB: 1929-04-07  PCP: Tracie Harrier, MD [id number]  DOA: 07/21/2021  LOS: 3 days  Hospital Day: 5   Chief Complaint  Patient presents with   Nausea    Brief narrative: Krystal Richardson is a 87 y.o. female with PMH significant for hyperlipidemia, hypothyroidism, basal cell carcinoma, melanoma (s/p of radiation therapy), gastric ulcer disease, thyroid nodules, hard of hearing who lives at home with son and was sent to the ED by PCP for failure to thrive, with 16 pounds weight loss in 2 weeks since passing of her husband  In the ED, patient was hemodynamically stable Labs with unremarkable CBC, sodium level low at 125, potassium low at 2.9, troponin elevated to 71, serum osmolality low at 268, urine osmolality low at 254 COVID PCR positive. Patient was kept in observation under hospitalist service.  Subjective: Patient was seen and examined this morning.  Pleasant elderly Caucasian female.  Lying on bed.  States that she took her breakfast today.  I looked at the breakfast tray.  She had only eaten some.  States she will try it again later. Family not at bedside today.  To be seen by PT again today.  Assessment/Plan: Failure to thrive Moderate malnutrition Significant weight loss Generalized weakness -Reported 16 pounds weight loss in 2 weeks after her husband passed away. -Significant electrolyte abnormalities -PT eval was obtained. Nutrition consult appreciated. -Currently on normal saline at 50 mill per hour. -Also n Remeron and Megace to boost appetite.  Intractable nausea -Presented with significant nausea for several days prior to presentation which is strongly contributing to her loss of appetite and malnutrition. -Currently on scheduled Zofran -7/20 and, CT abdomen pelvis did not show any acute intra-abdominal pathology.  Grief/depression -Husband passed away 2 weeks ago after which she had significant decline.  She is in a state of  grief.  -Currently on Cymbalta 20 mg daily.    COVID-19 virus infection -Currently not in respiratory distress.  Started on a short course of IV remdesivir. -As needed bronchodilators and Mucinex.  Acute hypovolemic hyponatremia -Presented with a low sodium level of 125 -Low serum osmolality as well as urine osmolality. -Currently on normal saline.  Azzalure sodium chloride 1 g 3 times daily times. Recent Labs  Lab 07/18/21 1440 07/21/21 1140 07/21/21 1552 07/21/21 2252 07/22/21 0726 07/23/21 0446 07/24/21 0635 07/25/21 0636  NA 129* 125* 127* 127* 125* 126* 125* 124*    Hypokalemia/hypophosphatemia -Potassium level improved with replacement.  Phosphorus level remains low at 2.3 today.  IV sodium replacement ordered.   Recent Labs  Lab 07/21/21 1404 07/21/21 1552 07/21/21 2252 07/22/21 0726 07/23/21 0446 07/24/21 0635 07/25/21 0636  K  --  3.2* 3.6 3.3* 3.8 3.1* 3.5  MG 2.0  --   --   --  1.8  --   --   PHOS  --  2.8  --   --  2.1*  --  2.3*    Elevated troponin -Likely demand ischemia.  No active anginal symptoms.  Continue aspirin and statin  HLD  -Lipitor 10 mg daily   Hypothyroidism -Continue Synthroid  Mobility: Walked with PT Code Status:   Code Status: DNR  Nutritional status: Body mass index is 17.96 kg/m. Nutrition Problem: Moderate Malnutrition Etiology: acute illness (COVID19) Signs/Symptoms: mild fat depletion, mild muscle depletion, energy intake < 75% for > 7 days Diet:  Diet Order             Diet  regular Room service appropriate? Yes; Fluid consistency: Thin  Diet effective now                  DVT prophylaxis:  enoxaparin (LOVENOX) injection 40 mg Start: 07/21/21 2200   Antimicrobials: Remdesivir IV Fluid: Normal saline at 50 mill per hour Consultants: None  Family Communication: Family not at bedside  Status is: Inpatient  Remains inpatient appropriate because: Poor appetite, may need placement.  Dispo: The patient is  from: Home              Anticipated d/c is to: Son wants to look for SNF.              Patient currently is not medically stable to d/c.   Difficult to place patient No     Infusions:   sodium chloride 50 mL/hr at 07/24/21 1352   sodium phosphate  Dextrose 5% IVPB 30 mmol (07/25/21 0859)    Scheduled Meds:  aspirin EC  81 mg Oral Daily   atorvastatin  10 mg Oral Daily   DULoxetine  30 mg Oral Daily   enoxaparin (LOVENOX) injection  40 mg Subcutaneous Q24H   feeding supplement  237 mL Oral BID BM   levothyroxine  25 mcg Oral Q0600   megestrol  40 mg Oral Daily   mirtazapine  15 mg Oral QHS   multivitamin with minerals  1 tablet Oral Daily   ondansetron (ZOFRAN) IV  4 mg Intravenous Q8H   senna-docusate  1 tablet Oral QHS   sodium chloride  1 g Oral TID WC    Antimicrobials: Anti-infectives (From admission, onward)    Start     Dose/Rate Route Frequency Ordered Stop   07/22/21 1000  remdesivir 100 mg in sodium chloride 0.9 % 100 mL IVPB       See Hyperspace for full Linked Orders Report.   100 mg 200 mL/hr over 30 Minutes Intravenous Daily 07/21/21 1757 07/25/21 0941   07/21/21 2000  remdesivir 200 mg in sodium chloride 0.9% 250 mL IVPB       See Hyperspace for full Linked Orders Report.   200 mg 580 mL/hr over 30 Minutes Intravenous  Once 07/21/21 1757 07/21/21 2156       PRN meds: acetaminophen, albuterol, ALPRAZolam, dextromethorphan-guaiFENesin, hydrALAZINE, polyethylene glycol   Objective: Vitals:   07/25/21 0807 07/25/21 1128  BP: 129/62 (!) 149/66  Pulse: 93 82  Resp: 18 16  Temp: 97.6 F (36.4 C) 97.9 F (36.6 C)  SpO2: 100% 100%   No intake or output data in the 24 hours ending 07/25/21 1436  Filed Weights   07/21/21 1138  Weight: 46 kg   Weight change:  Body mass index is 17.96 kg/m.   Physical Exam: General exam: Pleasant, elderly Caucasian female. Not in physical distress. Skin: No rashes, lesions or ulcers. HEENT: Atraumatic,  normocephalic, no obvious bleeding Lungs: Clear to auscultation bilaterally CVS: Regular rate and rhythm, no murmur GI/Abd soft, nontender, nondistended, bowel sound present CNS: Alert, awake, hard of hearing.  Slow to respond but oriented to place and person Psychiatry: Depressed look Extremities: No pedal edema, no calf tenderness  Data Review: I have personally reviewed the laboratory data and studies available.  Recent Labs  Lab 07/18/21 1440 07/21/21 1140 07/23/21 0446 07/24/21 0635 07/25/21 0636  WBC 7.4 9.0 6.9 6.3 7.0  NEUTROABS  --   --  4.4 4.3 4.6  HGB 11.9* 13.1 11.7* 11.9* 11.9*  HCT 33.3* 35.0* 32.8* 32.4*  32.8*  MCV 86.0 82.5 85.2 85.0 85.2  PLT 195 242 238 240 266    Recent Labs  Lab 07/21/21 1404 07/21/21 1552 07/21/21 2252 07/22/21 0726 07/23/21 0446 07/24/21 0635 07/25/21 0636  NA  --  127* 127* 125* 126* 125* 124*  K  --  3.2* 3.6 3.3* 3.8 3.1* 3.5  CL  --  94* 98 97* 100 96* 96*  CO2  --  21* 19* 19* '22 24 23  '$ GLUCOSE  --  93 111* 94 89 112* 100*  BUN  --  24* '22 19 21 19 14  '$ CREATININE  --  0.85 0.72 0.43* 0.48 0.43* 0.49  CALCIUM  --  8.7* 8.4* 8.2* 8.2* 7.9* 8.1*  MG 2.0  --   --   --  1.8  --   --   PHOS  --  2.8  --   --  2.1*  --  2.3*     F/u labs ordered Unresulted Labs (From admission, onward)     Start     Ordered   07/26/21 [id number]  Basic metabolic panel  Daily,   R      07/25/21 0746   07/26/21 0500  Phosphorus  Tomorrow morning,   R        07/25/21 0746   07/26/21 0500  Magnesium  Tomorrow morning,   R        07/25/21 0746            Signed, Terrilee Croak, MD [facility] 07/25/2021

## 2021-07-26 DIAGNOSIS — R627 Adult failure to thrive: Secondary | ICD-10-CM | POA: Diagnosis not present

## 2021-07-26 LAB — BASIC METABOLIC PANEL
Anion gap: 8 (ref 5–15)
BUN: 10 mg/dL (ref 8–23)
CO2: 24 mmol/L (ref 22–32)
Calcium: 8.3 mg/dL — ABNORMAL LOW (ref 8.9–10.3)
Chloride: 96 mmol/L — ABNORMAL LOW (ref 98–111)
Creatinine, Ser: 0.43 mg/dL — ABNORMAL LOW (ref 0.44–1.00)
GFR, Estimated: >60 mL/min (ref >60)
Glucose, Bld: 101 mg/dL — ABNORMAL HIGH (ref 70–99)
Potassium: 3.1 mmol/L — ABNORMAL LOW (ref 3.5–5.1)
Sodium: 128 mmol/L — ABNORMAL LOW (ref 135–145)

## 2021-07-26 LAB — PHOSPHORUS: Phosphorus: 2.5 mg/dL (ref 2.5–4.6)

## 2021-07-26 LAB — GLUCOSE, CAPILLARY: Glucose-Capillary: 100 mg/dL — ABNORMAL HIGH (ref 70–99)

## 2021-07-26 LAB — MAGNESIUM: Magnesium: 1.8 mg/dL (ref 1.7–2.4)

## 2021-07-26 MED ORDER — POTASSIUM CHLORIDE CRYS ER 20 MEQ PO TBCR: 40.0000 meq | EXTENDED_RELEASE_TABLET | Freq: Once | ORAL | Status: DC

## 2021-07-26 MED ORDER — ENSURE ENLIVE PO LIQD: 237.0000 mL | Freq: Two times a day (BID) | ORAL | 12 refills | Status: AC

## 2021-07-26 MED ORDER — MIRTAZAPINE 15 MG PO TBDP: 15.0000 mg | ORAL_TABLET | Freq: Every day | ORAL | 0 refills | Status: DC

## 2021-07-26 MED ORDER — ONDANSETRON HCL 4 MG PO TABS: 4.0000 mg | ORAL_TABLET | Freq: Three times a day (TID) | ORAL | 0 refills | Status: AC | PRN

## 2021-07-26 MED ORDER — MEGESTROL ACETATE 40 MG PO TABS: 40.0000 mg | ORAL_TABLET | Freq: Every day | ORAL | 0 refills | Status: DC

## 2021-07-26 MED ORDER — SODIUM CHLORIDE 1 G PO TABS: 1.0000 g | ORAL_TABLET | Freq: Three times a day (TID) | ORAL | 0 refills | Status: AC

## 2021-07-26 MED ORDER — AMLODIPINE BESYLATE 5 MG PO TABS
2.5000 mg | ORAL_TABLET | Freq: Every day | ORAL | Status: DC
  Administered 2021-07-26: 10:00:00 2.5 mg via ORAL
  Filled 2021-07-26: qty 1

## 2021-07-26 MED ORDER — DULOXETINE HCL 30 MG PO CPEP: 30.0000 mg | ORAL_CAPSULE | Freq: Every day | ORAL | 0 refills | Status: DC

## 2021-07-26 NOTE — Care Management Important Message (Signed)
Important Message  Patient Details  Name: Krystal Richardson MRN: [id number] Date of Birth: 05/11/1929   Medicare Important Message Given:  Yes  Patient is in an isolation room so I called her room [phone]) and her son, [name] said she was sleeping but I could review the Important Message from Medicare with him. [name] they were in agreement with the discharge plan and states he feels like we have done everything we can and thanked [place] for everything. I asked if he would like a copy of the form but he replied no. I thanked him for his time.      [name] [name] 07/26/2021, 12:57 PM

## 2021-07-26 NOTE — TOC Transition Note (Signed)
Transition of Care [facility]) - CM/SW Discharge Note   Patient Details  Name: [name] MRN: [id number] Date of Birth: 02-20-1929  Transition of Care [facility]) CM/SW Contact:  Shelbie Hutching, RN Phone Number: 07/26/2021, 12:24 PM   Clinical Narrative:    Patient is medically cleared for discharge home today.  Patient's son Heath Lark is at the bedside.  Heath Lark is in agreement with discharge home with home health services.  The family is looking into hiring someone to come and sit with patient during the day, they have a few names that they are going to reach out to.  Son agrees to home health services and has no preference, he is good with [facility].  Sharmon Revere with [facility] has accepted [facility] referral for RN, PT, OT, aide and SW.  Patient has a walker at home and Heath Lark will provide her transportation to home today.     Final next level of care: Home w Home Health Services Barriers to Discharge: Barriers Resolved   Patient Goals and CMS Choice Patient states their goals for this hospitalization and ongoing recovery are:: Patient's son reports that he wants to get her back home CMS Medicare.gov Compare Post Acute Care list provided to:: Patient Represenative (must comment) Choice offered to / list presented to : Adult Children  Discharge Placement                       Discharge Plan and Services   Discharge Planning Services: CM Consult Post Acute Care Choice: Home Health          DME Arranged: N/A DME Agency: NA       HH Arranged: RN, PT, OT, Nurse's Aide, Social Work [employer] Agency: [place] Date HH Agency Contacted: 07/26/21 Time [place]: 1222 Representative spoke with at [place]: [place] ([place]) Interventions     Readmission Risk Interventions No flowsheet data found.

## 2021-07-26 NOTE — Discharge Summary (Signed)
Physician Discharge Summary  Krystal Richardson [id number] DOB: 12/29/1928 DOA: 07/21/2021  PCP: Tracie Harrier, MD  Admit date: 07/21/2021 Discharge date: 07/26/2021  Admitted From: Home Discharge disposition: Home with home health services   Code Status: DNR   Discharge Diagnosis:   Principal Problem:   Failure to thrive in adult Active Problems:   HLD (hyperlipidemia)   Hyponatremia   Elevated troponin   Hypokalemia   Hypothyroidism   COVID-19 virus infection   Malnutrition of moderate degree    Chief Complaint  Patient presents with   Nausea    Brief narrative: Krystal Richardson is a 87 y.o. female with PMH significant for hyperlipidemia, hypothyroidism, basal cell carcinoma, melanoma (s/p of radiation therapy), gastric ulcer disease, thyroid nodules, hard of hearing who lives at home with son and was sent to the ED by PCP for failure to thrive, with 16 pounds weight loss in 2 weeks since passing of her husband  In the ED, patient was hemodynamically stable Labs with unremarkable CBC, sodium level low at 125, potassium low at 2.9, troponin elevated to 71, serum osmolality low at 268, urine osmolality low at 254 COVID PCR positive. Patient was kept in observation under hospitalist service.  Subjective: Patient was seen and examined this morning.  Pleasant elderly Caucasian female.  Lying on bed.  States that she took her breakfast today.  I looked at the breakfast tray.  She had only eaten some.  States she will try it again later. Family not at bedside today.  To be seen by PT again today.  Hospital course: Failure to thrive Moderate malnutrition Significant weight loss Generalized weakness -Reported 16 pounds weight loss in 2 weeks after her husband passed away. -Presented with significant electrolyte abnormalities -PT eval was obtained. Nutrition consult appreciated. -Given adequate hydration.  Started on Remeron and Megace to boost appetite. -Needs persistent  encouragement to eat.  Intractable nausea -Presented with significant nausea for several days prior to presentation which is strongly contributing to her loss of appetite and malnutrition. -7/20 and, CT abdomen pelvis did not show any acute intra-abdominal pathology. -Currently on scheduled Zofran.  Improving with the same.  Discharge with as needed Zofran  Grief/depression -Husband passed away 2 weeks ago after which she had significant decline.  She is in a state of grief.  -Started on Cymbalta 20 mg daily.    COVID-19 virus infection -Not in respiratory distress.  Completed a short course of IV remdesivir. -Continue as needed bronchodilators and Mucinex.  Acute hypovolemic hyponatremia -Presented with a low sodium level of 125 -Low serum osmolality as well as urine osmolality. -Sodium level improved with IV hydration and initiation of sodium chloride tablets.  We will discharge her on sodium chloride tablets for 2 weeks.  She needs to follow-up with primary care provider for repeat BMP to determine if she needs to continue sodium chloride tablets. Recent Labs  Lab 07/21/21 1140 07/21/21 1552 07/21/21 2252 07/22/21 0726 07/23/21 0446 07/24/21 0635 07/25/21 0636 07/26/21 0533  NA 125* 127* 127* 125* 126* 125* 124* 128*   Hypokalemia/hypophosphatemia -Potassium and phosphorus level improved with replacement. Recent Labs  Lab 07/21/21 1404 07/21/21 1552 07/21/21 2252 07/22/21 0726 07/23/21 0446 07/24/21 0635 07/25/21 0636 07/26/21 0533  K  --  3.2*   < > 3.3* 3.8 3.1* 3.5 3.1*  MG 2.0  --   --   --  1.8  --   --  1.8  PHOS  --  2.8  --   --  2.1*  --  2.3* 2.5   < > = values in this interval not displayed.   Elevated troponin -Likely demand ischemia.  No active anginal symptoms.     Hypothyroidism -Continue Synthroid   Allergies as of 07/26/2021       Reactions   Toprol Xl [metoprolol Tartrate]         Medication List     TAKE these medications     DULoxetine 30 MG capsule Commonly known as: CYMBALTA Take 1 capsule (30 mg total) by mouth daily. Start taking on: July 27, 2021   feeding supplement Liqd Take 237 mLs by mouth 2 (two) times daily between meals.   levothyroxine 25 MCG tablet Commonly known as: SYNTHROID Take 25 mcg by mouth daily before breakfast.   megestrol 40 MG tablet Commonly known as: MEGACE Take 1 tablet (40 mg total) by mouth daily. Start taking on: July 27, 2021   mirtazapine 15 MG disintegrating tablet Commonly known as: REMERON SOL-TAB Take 1 tablet (15 mg total) by mouth at bedtime.   ondansetron 4 MG tablet Commonly known as: Zofran Take 1 tablet (4 mg total) by mouth every 8 (eight) hours as needed for up to 14 days for nausea or vomiting.   sodium chloride 1 g tablet Take 1 tablet (1 g total) by mouth 3 (three) times daily with meals for 14 days.        Discharge Instructions:  Diet Recommendation: Is a poor appetite, I would not restrict her diet.   Follow with Primary MD Tracie Harrier, MD in 7 days   Get CBC/BMP checked in next visit within 1 week by PCP or SNF MD ( we routinely change or add medications that can affect your baseline labs and fluid status, therefore we recommend that you get the mentioned basic workup next visit with your PCP, your PCP may decide not to get them or add new tests based on their clinical decision)  On your next visit with your PCP, please Get Medicines reviewed and adjusted.  Please request your PCP  to go over all Hospital Tests and Procedure/Radiological results at the follow up, please get all Hospital records sent to your Prim MD by signing hospital release before you go home.  Activity: As tolerated with Full fall precautions use walker/cane & assistance as needed  For Heart failure patients - Check your Weight same time everyday, if you gain over 2 pounds, or you develop in leg swelling, experience more shortness of breath or chest pain,  call your Primary MD immediately. Follow Cardiac Low Salt Diet and 1.5 lit/day fluid restriction.  If you have smoked or chewed Tobacco in the last 2 yrs please stop smoking, stop any regular Alcohol  and or any Recreational drug use.  If you experience worsening of your admission symptoms, develop shortness of breath, life threatening emergency, suicidal or homicidal thoughts you must seek medical attention immediately by calling 911 or calling your MD immediately  if symptoms less severe.  You Must read complete instructions/literature along with all the possible adverse reactions/side effects for all the Medicines you take and that have been prescribed to you. Take any new Medicines after you have completely understood and accpet all the possible adverse reactions/side effects.   Do not drive, operate heavy machinery, perform activities at heights, swimming or participation in water activities or provide baby sitting services if your were admitted for syncope or siezures until you have seen by Primary MD or a Neurologist and  advised to do so again.  Do not drive when taking Pain medications.  Do not take more than prescribed Pain, Sleep and Anxiety Medications  Wear Seat belts while driving.   Please note You were cared for by a hospitalist during your hospital stay. If you have any questions about your discharge medications or the care you received while you were in the hospital after you are discharged, you can call the unit and asked to speak with the hospitalist on call if the hospitalist that took care of you is not available. Once you are discharged, your primary care physician will handle any further medical issues. Please note that NO REFILLS for any discharge medications will be authorized once you are discharged, as it is imperative that you return to your primary care physician (or establish a relationship with a primary care physician if you do not have one) for your aftercare needs so  that they can reassess your need for medications and monitor your lab values.    Follow ups:    Follow-up Information     Tracie Harrier, MD Follow up.   Specialty: Internal Medicine Contact information: [place] [address] [phone]                 Wound care:     Discharge Exam:   Vitals:   07/25/21 1704 07/25/21 2100 07/26/21 0602 07/26/21 0917  BP: (!) 145/66 124/88 (!) 171/80 121/65  Pulse: 79 79 83 92  Resp: '18 18 18 16  '$ Temp: 98.1 F (36.7 C) 98.1 F (36.7 C) 98.2 F (36.8 C) 98.4 F (36.9 C)  TempSrc:  Oral Oral   SpO2: 99% 99% 99% 100%  Weight:      Height:        Body mass index is 17.96 kg/m.  General exam: Pleasant, elderly Caucasian female.  Not in physical distress Skin: No rashes, lesions or ulcers. HEENT: Atraumatic, normocephalic, no obvious bleeding Lungs: Clear to auscultation bilaterally CVS: Regular rate and rhythm, no murmur GI/Abd soft, nontender, nondistended, bowel sound present CNS: Alert, awake, oriented to place and person, cheerful Psychiatry: Cheerful Extremities: No edema, no calf tenderness  Time coordinating discharge: 35 minutes   The results of significant diagnostics from this hospitalization (including imaging, microbiology, ancillary and laboratory) are listed below for reference.    Procedures and Diagnostic Studies:   DG Chest Port 1 View  Result Date: 07/21/2021 CLINICAL DATA:  COVID positive EXAM: PORTABLE CHEST 1 VIEW COMPARISON:  Radiograph 07/18/2021 FINDINGS: Unchanged cardiomediastinal silhouette. Pulmonary hyperinflation, unchanged. There is no new focal airspace disease. No large pleural effusion or visible pneumothorax. No acute osseous abnormality. Thoracic spondylosis. IMPRESSION: No new focal airspace disease.  Unchanged pulmonary hyperinflation. Electronically Signed   By: Maurine Simmering   On: 07/21/2021 18:55     Labs:   Basic Metabolic Panel: Recent Labs  Lab  07/21/21 1404 07/21/21 1552 07/21/21 2252 07/22/21 [id number] 07/23/21 [id number] 07/24/21 [id number] 07/25/21 0636 07/26/21 0533  NA  --  127*   < > 125* 126* 125* 124* 128*  K  --  3.2*   < > 3.3* 3.8 3.1* 3.5 3.1*  CL  --  94*   < > 97* 100 96* 96* 96*  CO2  --  21*   < > 19* '22 24 23 24  '$ GLUCOSE  --  93   < > 94 89 112* 100* 101*  BUN  --  24*   < > 19 21  $'19 14 10  'S$ CREATININE  --  0.85   < > 0.43* 0.48 0.43* 0.49 0.43*  CALCIUM  --  8.7*   < > 8.2* 8.2* 7.9* 8.1* 8.3*  MG 2.0  --   --   --  1.8  --   --  1.8  PHOS  --  2.8  --   --  2.1*  --  2.3* 2.5   < > = values in this interval not displayed.   GFR Estimated Creatinine Clearance: 33.9 mL/min (A) (by C-G formula based on SCr of 0.43 mg/dL (L)). Liver Function Tests: Recent Labs  Lab 07/21/21 1140  AST 21  ALT 18  ALKPHOS 140*  BILITOT 1.6*  PROT 7.2  ALBUMIN 4.1   Recent Labs  Lab 07/21/21 1140  LIPASE 52*   No results for input(s): AMMONIA in the last 168 hours. Coagulation profile No results for input(s): INR, PROTIME in the last 168 hours.  CBC: Recent Labs  Lab 07/21/21 1140 07/23/21 0446 07/24/21 0635 07/25/21 0636  WBC 9.0 6.9 6.3 7.0  NEUTROABS  --  4.4 4.3 4.6  HGB 13.1 11.7* 11.9* 11.9*  HCT 35.0* 32.8* 32.4* 32.8*  MCV 82.5 85.2 85.0 85.2  PLT 242 238 240 266   Cardiac Enzymes: No results for input(s): CKTOTAL, CKMB, CKMBINDEX, TROPONINI in the last 168 hours. BNP: Invalid input(s): POCBNP CBG: Recent Labs  Lab 07/23/21 1136 07/24/21 0732 07/24/21 1714 07/25/21 0809 07/26/21 0918  GLUCAP 165* 106* 141* 98 100*   D-Dimer No results for input(s): DDIMER in the last 72 hours. Hgb A1c No results for input(s): HGBA1C in the last 72 hours. Lipid Profile No results for input(s): CHOL, HDL, LDLCALC, TRIG, CHOLHDL, LDLDIRECT in the last 72 hours. Thyroid function studies No results for input(s): TSH, T4TOTAL, T3FREE, THYROIDAB in the last 72 hours.  Invalid input(s): FREET3 Anemia work up No  results for input(s): VITAMINB12, FOLATE, FERRITIN, TIBC, IRON, RETICCTPCT in the last 72 hours. Microbiology Recent Results (from the past 240 hour(s))  Resp Panel by RT-PCR (Flu A&B, Covid) Urine, Clean Catch     Status: Abnormal   Collection Time: 07/21/21  3:51 PM   Specimen: Urine, Clean Catch; Nasopharyngeal(NP) swabs in vial transport medium  Result Value Ref Range Status   SARS Coronavirus 2 by RT PCR POSITIVE (A) NEGATIVE Final    Comment: RESULT CALLED TO, READ BACK BY AND VERIFIED WITH: SAM CAIN '@1727'$  07/21/21 MJU (NOTE) SARS-CoV-2 target nucleic acids are DETECTED.  The SARS-CoV-2 RNA is generally detectable in upper respiratory specimens during the acute phase of infection. Positive results are indicative of the presence of the identified virus, but do not rule out bacterial infection or co-infection with other pathogens not detected by the test. Clinical correlation with patient history and other diagnostic information is necessary to determine patient infection status. The expected result is Negative.  Fact Sheet for Patients: EntrepreneurPulse.com.au  Fact Sheet for Healthcare Providers: IncredibleEmployment.be  This test is not yet approved or cleared by the [place] FDA and  has been authorized for detection and/or diagnosis of SARS-CoV-2 by FDA under an Emergency Use Authorization (EUA).  This EUA will remain in effect (meaning this test can be used)  for the duration of  the COVID-19 declaration under Section 564(b)(1) of the Act, 21 U.S.C. section 360bbb-3(b)(1), unless the authorization is terminated or revoked sooner.     Influenza A by PCR NEGATIVE NEGATIVE Final   Influenza B by PCR NEGATIVE NEGATIVE Final  Comment: (NOTE) The Xpert Xpress SARS-CoV-2/FLU/RSV plus assay is intended as an aid in the diagnosis of influenza from Nasopharyngeal swab specimens and should not be used as a sole basis for treatment.  Nasal washings and aspirates are unacceptable for Xpert Xpress SARS-CoV-2/FLU/RSV testing.  Fact Sheet for Patients: EntrepreneurPulse.com.au  Fact Sheet for Healthcare Providers: IncredibleEmployment.be  This test is not yet approved or cleared by the [place] FDA and has been authorized for detection and/or diagnosis of SARS-CoV-2 by FDA under an Emergency Use Authorization (EUA). This EUA will remain in effect (meaning this test can be used) for the duration of the COVID-19 declaration under Section 564(b)(1) of the Act, 21 U.S.C. section 360bbb-3(b)(1), unless the authorization is terminated or revoked.  Performed at [facility], [address] [place], [place] [address]      Signed: Terrilee Croak  [facility] 07/26/2021, 11:55 AM

## 2021-07-26 NOTE — Progress Notes (Signed)
Physical Therapy Treatment Patient Details Name: Krystal Richardson MRN: [id number] DOB: 22-Nov-1929 Today's Date: 07/26/2021    History of Present Illness 87 y.o. female with medical history significant of hyperlipidemia, hypothyroidism, basal cell carcinoma, melanoma (s/p of radiation therapy), gastric ulcer disease, thyroid nodules, hard of hearing, who present with failure to thrive. Per her son, patient has been unwell for the more than 2 weeks since the passing of her husband. Patient has a poor appetite, decreased oral intake.  Patient has nausea and dry heaves, no vomiting, diarrhea or abdominal pain.  No symptoms of UTI. Patient was was seen in ED2 days ago with same symptoms as well as shortness of breath but reports the shortness of breath has improved, however denies any improvement in poor appetite. Patient does not have chest pain, shortness of breath, cough, fever or chills.  She was referred from her PCP because she has had a 16 pound weight loss in the last 2 weeks. Found to be Covid-19+.    PT Comments    Pt received supine in bed, agreeable to therapy. Son in room. Pt performed bed mobility and sitting balance while donning slip-on shoes with Mod I. Pt stood prior to instruction without AD, PT held onto gait belt due to lack of stability and assisted pt with sitting back down. With RW, pt stood with improved stability. She ambulated until fatigue, achieving 7 laps within room (210 feet). She demo good navigation skills using RW to avoid obstacles. Velocity remains decreased. SpO2 98% and HR 101. Upon completion, pt was fatigued and asked to return to bed. Pt was positioned by PT and son to ensure comfort. Would benefit from skilled PT to address above deficits and promote optimal return to PLOF.    Follow Up Recommendations  Home health PT     Equipment Recommendations  None recommended by PT    Recommendations for Other Services       Precautions / Restrictions  Precautions Precautions: Fall Restrictions Weight Bearing Restrictions: No    Mobility  Bed Mobility Overal bed mobility: Modified Independent Bed Mobility: Supine to Sit;Sit to Supine     Supine to sit: Modified independent (Device/Increase time) Sit to supine: Modified independent (Device/Increase time)   General bed mobility comments: increased time to complete, HOB elevated    Transfers Overall transfer level: Needs assistance Equipment used: Rolling walker (2 wheeled) Transfers: Sit to/from Stand Sit to Stand: Min guard         General transfer comment: 2 reps. RW and CGA to steady  Ambulation/Gait Ambulation/Gait assistance: Min guard Gait Distance (Feet): 210 Feet Assistive device: Rolling walker (2 wheeled) Gait Pattern/deviations: Step-through pattern;Trunk flexed Gait velocity: decreased   General Gait Details: Pt ambulated 7 laps in room with multiple turns due to limited space, used RW. Upon completion, SpO2 98% and HR 101.   Stairs             Wheelchair Mobility    Modified Rankin (Stroke Patients Only)       Balance Overall balance assessment: Needs assistance Sitting-balance support: No upper extremity supported;Feet supported Sitting balance-Leahy Scale: Good     Standing balance support: Bilateral upper extremity supported;During functional activity Standing balance-Leahy Scale: Fair Standing balance comment: no LOB using RW                            Cognition Arousal/Alertness: Awake/alert Behavior During Therapy: WFL for tasks assessed/performed Overall Cognitive Status:  Within Functional Limits for tasks assessed                                 General Comments: HOH      Exercises      General Comments        Pertinent Vitals/Pain Pain Assessment: No/denies pain    Home Living                      Prior Function            PT Goals (current goals can now be found in the  care plan section) Acute Rehab PT Goals Patient Stated Goal: To go back home. PT Goal Formulation: With patient/family Time For Goal Achievement: 08/05/21 Potential to Achieve Goals: Good    Frequency    Min 2X/week      PT Plan      Co-evaluation              AM-PAC PT "6 Clicks" Mobility   Outcome Measure  Help needed turning from your back to your side while in a flat bed without using bedrails?: None Help needed moving from lying on your back to sitting on the side of a flat bed without using bedrails?: None Help needed moving to and from a bed to a chair (including a wheelchair)?: A Little Help needed standing up from a chair using your arms (e.g., wheelchair or bedside chair)?: A Little Help needed to walk in hospital room?: A Little Help needed climbing 3-5 steps with a railing? : A Little 6 Click Score: 20    End of Session Equipment Utilized During Treatment: Gait belt Activity Tolerance: Patient tolerated treatment well;Patient limited by fatigue Patient left: in bed;with call bell/phone within reach;with family/visitor present;with bed alarm set   PT Visit Diagnosis: Unsteadiness on feet (R26.81);Muscle weakness (generalized) (M62.81)     Time: 1030-1045 PT Time Calculation (min) (ACUTE ONLY): 15 min  Charges:  $Therapeutic Activity: 8-22 mins                     Patrina Levering PT, DPT 07/26/21 11:22 AM [id number]    Ramonita Lab 07/26/2021, 11:16 AM

## 2021-08-11 ENCOUNTER — Other Ambulatory Visit: Payer: Self-pay

## 2021-08-11 ENCOUNTER — Inpatient Hospital Stay
Admission: EM | Admit: 2021-08-11 | Discharge: 2021-08-14 | DRG: 640 | Disposition: A | Discharge status: Home Health | Payer: Medicare Other | Attending: Internal Medicine | Admitting: Internal Medicine

## 2021-08-11 ENCOUNTER — Emergency Department: Payer: Medicare Other

## 2021-08-11 DIAGNOSIS — Z66 Do not resuscitate: Secondary | ICD-10-CM | POA: Diagnosis present

## 2021-08-11 DIAGNOSIS — R110 Nausea: Secondary | ICD-10-CM

## 2021-08-11 DIAGNOSIS — F32A Depression, unspecified: Secondary | ICD-10-CM

## 2021-08-11 DIAGNOSIS — H9190 Unspecified hearing loss, unspecified ear: Secondary | ICD-10-CM | POA: Diagnosis present

## 2021-08-11 DIAGNOSIS — R600 Localized edema: Secondary | ICD-10-CM

## 2021-08-11 DIAGNOSIS — E860 Dehydration: Principal | ICD-10-CM | POA: Diagnosis present

## 2021-08-11 DIAGNOSIS — U071 COVID-19: Secondary | ICD-10-CM

## 2021-08-11 DIAGNOSIS — I2699 Other pulmonary embolism without acute cor pulmonale: Secondary | ICD-10-CM

## 2021-08-11 DIAGNOSIS — E042 Nontoxic multinodular goiter: Secondary | ICD-10-CM | POA: Diagnosis present

## 2021-08-11 DIAGNOSIS — Z79890 Hormone replacement therapy (postmenopausal): Secondary | ICD-10-CM

## 2021-08-11 DIAGNOSIS — Z8616 Personal history of COVID-19: Secondary | ICD-10-CM

## 2021-08-11 DIAGNOSIS — Z681 Body mass index (BMI) 19 or less, adult: Secondary | ICD-10-CM

## 2021-08-11 DIAGNOSIS — F4320 Adjustment disorder, unspecified: Secondary | ICD-10-CM | POA: Diagnosis present

## 2021-08-11 DIAGNOSIS — E871 Hypo-osmolality and hyponatremia: Secondary | ICD-10-CM | POA: Diagnosis present

## 2021-08-11 DIAGNOSIS — I82531 Chronic embolism and thrombosis of right popliteal vein: Secondary | ICD-10-CM | POA: Diagnosis present

## 2021-08-11 DIAGNOSIS — Z85820 Personal history of malignant melanoma of skin: Secondary | ICD-10-CM

## 2021-08-11 DIAGNOSIS — E785 Hyperlipidemia, unspecified: Secondary | ICD-10-CM | POA: Diagnosis present

## 2021-08-11 DIAGNOSIS — E872 Acidosis, unspecified: Secondary | ICD-10-CM

## 2021-08-11 DIAGNOSIS — R638 Other symptoms and signs concerning food and fluid intake: Secondary | ICD-10-CM

## 2021-08-11 DIAGNOSIS — E861 Hypovolemia: Secondary | ICD-10-CM | POA: Diagnosis present

## 2021-08-11 DIAGNOSIS — Z923 Personal history of irradiation: Secondary | ICD-10-CM

## 2021-08-11 DIAGNOSIS — Z79899 Other long term (current) drug therapy: Secondary | ICD-10-CM

## 2021-08-11 DIAGNOSIS — Z634 Disappearance and death of family member: Secondary | ICD-10-CM

## 2021-08-11 DIAGNOSIS — N390 Urinary tract infection, site not specified: Secondary | ICD-10-CM | POA: Diagnosis present

## 2021-08-11 DIAGNOSIS — E039 Hypothyroidism, unspecified: Secondary | ICD-10-CM | POA: Diagnosis present

## 2021-08-11 DIAGNOSIS — R531 Weakness: Secondary | ICD-10-CM

## 2021-08-11 DIAGNOSIS — I951 Orthostatic hypotension: Secondary | ICD-10-CM | POA: Diagnosis present

## 2021-08-11 DIAGNOSIS — E43 Unspecified severe protein-calorie malnutrition: Principal | ICD-10-CM | POA: Insufficient documentation

## 2021-08-11 DIAGNOSIS — R627 Adult failure to thrive: Secondary | ICD-10-CM | POA: Diagnosis present

## 2021-08-11 DIAGNOSIS — Z9221 Personal history of antineoplastic chemotherapy: Secondary | ICD-10-CM

## 2021-08-11 DIAGNOSIS — Z888 Allergy status to other drugs, medicaments and biological substances status: Secondary | ICD-10-CM

## 2021-08-11 LAB — CBC
HCT: 35.2 % — ABNORMAL LOW (ref 36.0–46.0)
Hemoglobin: 12.6 g/dL (ref 12.0–15.0)
MCH: 30.7 pg (ref 26.0–34.0)
MCHC: 35.8 g/dL (ref 30.0–36.0)
MCV: 85.6 fL (ref 80.0–100.0)
Platelets: 271 10*3/uL (ref 150–400)
RBC: 4.11 MIL/uL (ref 3.87–5.11)
RDW: 15.8 % — ABNORMAL HIGH (ref 11.5–15.5)
WBC: 7.9 10*3/uL (ref 4.0–10.5)
nRBC: 0 % (ref 0.0–0.2)

## 2021-08-11 LAB — URINALYSIS, COMPLETE (UACMP) WITH MICROSCOPIC
Bilirubin Urine: NEGATIVE
Glucose, UA: NEGATIVE mg/dL
Ketones, ur: 80 mg/dL — AB
Nitrite: NEGATIVE
Protein, ur: 100 mg/dL — AB
Specific Gravity, Urine: 1.025 (ref 1.005–1.030)
WBC, UA: >50 WBC/hpf — ABNORMAL HIGH (ref 0–5)
pH: 5 (ref 5.0–8.0)

## 2021-08-11 LAB — BASIC METABOLIC PANEL
Anion gap: 11 (ref 5–15)
BUN: 43 mg/dL — ABNORMAL HIGH (ref 8–23)
CO2: 18 mmol/L — ABNORMAL LOW (ref 22–32)
Calcium: 9.3 mg/dL (ref 8.9–10.3)
Chloride: 101 mmol/L (ref 98–111)
Creatinine, Ser: 0.84 mg/dL (ref 0.44–1.00)
GFR, Estimated: >60 mL/min (ref >60)
Glucose, Bld: 127 mg/dL — ABNORMAL HIGH (ref 70–99)
Potassium: 3.6 mmol/L (ref 3.5–5.1)
Sodium: 130 mmol/L — ABNORMAL LOW (ref 135–145)

## 2021-08-11 MED ORDER — SODIUM CHLORIDE 0.9 % IV BOLUS (SEPSIS)
1000.0000 mL | Freq: Once | INTRAVENOUS | Status: AC
  Administered 2021-08-12: 1000 mL via INTRAVENOUS

## 2021-08-11 MED ORDER — ONDANSETRON HCL 4 MG/2ML IJ SOLN
4.0000 mg | Freq: Once | INTRAMUSCULAR | Status: AC
  Administered 2021-08-12: 4 mg via INTRAVENOUS
  Filled 2021-08-11: qty 2

## 2021-08-11 MED ORDER — SODIUM CHLORIDE 0.9 % IV SOLN: INTRAVENOUS | Status: DC

## 2021-08-11 MED ORDER — SODIUM CHLORIDE 0.9 % IV SOLN
1.0000 g | Freq: Once | INTRAVENOUS | Status: AC
  Administered 2021-08-12: 1 g via INTRAVENOUS
  Filled 2021-08-11: qty 10

## 2021-08-11 NOTE — ED Triage Notes (Signed)
Pt to ED with son for failure to thrive. Recently here for same, was placed on meds for appetite and anxiety.  Pt nauseous in triage.  Son reports has not ate last Thursday, drinking pedialyte.  Grieving recent loss of husband  Denies pain

## 2021-08-11 NOTE — ED Notes (Signed)
Patient assisted to commode; urine specimen collected/ labeled

## 2021-08-11 NOTE — ED Provider Notes (Signed)
[facility] Emergency Department Provider Note  ____________________________________________   Event Date/Time   First MD Initiated Contact with Patient 08/11/21 2309     (approximate)  I have reviewed the triage vital signs and the nursing notes.   HISTORY  Chief Complaint Failure To Thrive    HPI LIVELY BEHAR is a 87 y.o. female with history of melanoma, hyperlipidemia, hypothyroidism who presents to the emergency department with concerns of failure to thrive.  Recently admitted to the hospital for the same was found to be hyponatremic.  Son reports that she was started on appetite stimulants, antidepressants during her last admission and seems to be significantly improving.  He states 6 days ago however she appeared to decompensate again and was back to how she was before she was admitted where she is not eating, drinking.  She is complaining of nausea and dry heaving but no vomiting.  No fevers, diarrhea.  No complaints of pain.  States she has had urinary urgency and frequency today.  They saw their primary care doctor who recommend that she come to the emergency department.  She was found to be tachycardic to 111 at her PCP appointment.  Son reports that she lives at home but has caregivers 24/7 since her last admission.  She uses a walker at baseline.  No falls.  He denies that she is complained of any SI.  States that he thinks that grief is playing a role as she lost her husband on July 11 and his birthday was August [birth year].  Son also reports that she has look like she is intermittently short of breath for the past year.  No cough.  Patient tested positive for COVID-19 on 07/21/2021.         Past Medical History:  Diagnosis Date   Actinic keratosis 03/12/2019   mid post scalp melanoma graft scar   Basal cell carcinoma 08/04/2014   R forehead - excision 08/19/2014   BCC (basal cell carcinoma of skin) 02/20/2019   L dorsum foot   Hyperlipidemia     Melanoma ([place]) 2002   with chemo and rad tx   Personal history of chemotherapy 2003   MELANOMA   Personal history of radiation therapy 2002   MELANOMA   Stomach ulcer    Urethral caruncle     Patient Active Problem List   Diagnosis Date Noted   Malnutrition of moderate degree 07/23/2021   Failure to thrive in adult 07/21/2021   COVID-19 virus infection 07/21/2021   Hyperlipidemia    Hyponatremia    Elevated troponin    Hypokalemia    Hypothyroidism    Multiple thyroid nodules 09/09/2015   Urethral caruncle 06/28/2015   Osteoporosis, post-menopausal 06/25/2015   Melanoma of scalp ([place]) 06/25/2015   HLD (hyperlipidemia) 06/25/2015    Past Surgical History:  Procedure Laterality Date   BREAST EXCISIONAL BIOPSY Left 1986   EXCISIONAL - NEG   cataract     hemmriodectomy  1980   melanoma  2002    Prior to Admission medications   Medication Sig Start Date End Date Taking? Authorizing Provider  DULoxetine (CYMBALTA) 30 MG capsule Take 1 capsule (30 mg total) by mouth daily. 07/27/21 08/26/21 Yes Dahal, Marlowe Aschoff, MD  feeding supplement (ENSURE ENLIVE / ENSURE PLUS) LIQD Take 237 mLs by mouth 2 (two) times daily between meals. 07/26/21  Yes Dahal, Marlowe Aschoff, MD  levothyroxine (SYNTHROID, LEVOTHROID) 25 MCG tablet Take 25 mcg by mouth daily before breakfast.   Yes  [provider]  megestrol (MEGACE) 40 MG tablet Take 1 tablet (40 mg total) by mouth daily. 07/27/21 08/26/21 Yes Dahal, Marlowe Aschoff, MD  mirtazapine (REMERON SOL-TAB) 15 MG disintegrating tablet Take 1 tablet (15 mg total) by mouth at bedtime. 07/26/21 08/25/21 Yes Dahal, Marlowe Aschoff, MD    Allergies Toprol xl [metoprolol tartrate]  Family History  Problem Relation Age of Onset   Kidney disease Neg Hx    Bladder Cancer Neg Hx    Prostate cancer Neg Hx    Breast cancer Neg Hx     Social History Social History   Tobacco Use   Smoking status: Never   Smokeless tobacco: Never  Substance Use Topics   Alcohol use: No     Alcohol/week: 0.0 standard drinks   Drug use: No    Review of Systems Level 5 caveat secondary to patient being extremely hard of hearing  ____________________________________________   PHYSICAL EXAM:  VITAL SIGNS: ED Triage Vitals  Enc Vitals Group     BP 08/11/21 1637 (!) 133/93     Pulse Rate 08/11/21 1637 91     Resp 08/11/21 1637 (!) 22     Temp 08/11/21 1637 98.2 F (36.8 C)     Temp Source 08/11/21 1637 Oral     SpO2 08/11/21 1637 100 %     Weight 08/11/21 1640 86 lb (39 kg)     Height 08/11/21 1640 '5\' 3"'$  (1.6 m)     Head Circumference --      Peak Flow --      Pain Score 08/11/21 1640 0     Pain Loc --      Pain Edu? --      Excl. in [place]? --    CONSTITUTIONAL: Alert, elderly, dry heaving, having chills HEAD: Normocephalic, atraumatic EYES: Conjunctivae clear, pupils appear equal, EOM appear intact ENT: normal nose; moist mucous membranes NECK: Supple, normal ROM CARD: RRR; S1 and S2 appreciated; no murmurs, no clicks, no rubs, no gallops RESP: Normal chest excursion without splinting or tachypnea; breath sounds clear and equal bilaterally; no wheezes, no rhonchi, no rales, no hypoxia or respiratory distress, speaking full sentences ABD/GI: Normal bowel sounds; non-distended; soft, non-tender, no rebound, no guarding, no peritoneal signs, no hepatosplenomegaly BACK: The back appears normal EXT: Normal ROM in all joints; no deformity noted, no edema; no cyanosis SKIN: Normal color for age and race; warm; no rash on exposed skin NEURO: Moves all extremities equally, normal speech, no facial asymmetry, ambulates with assistance to the bathroom PSYCH: The patient's mood and manner are appropriate.  ____________________________________________   LABS (all labs ordered are listed, but only abnormal results are displayed)  Labs Reviewed  CBC - Abnormal; Notable for the following components:      Result Value   HCT 35.2 (*)    RDW 15.8 (*)    All other components  within normal limits  BASIC METABOLIC PANEL - Abnormal; Notable for the following components:   Sodium 130 (*)    CO2 18 (*)    Glucose, Bld 127 (*)    BUN 43 (*)    All other components within normal limits  URINALYSIS, COMPLETE (UACMP) WITH MICROSCOPIC - Abnormal; Notable for the following components:   Color, Urine AMBER (*)    APPearance CLOUDY (*)    Hgb urine dipstick SMALL (*)    Ketones, ur 80 (*)    Protein, ur 100 (*)    Leukocytes,Ua MODERATE (*)    WBC, UA >50 (*)  Bacteria, UA MANY (*)    All other components within normal limits  TSH - Abnormal; Notable for the following components:   TSH 4.924 (*)    All other components within normal limits  URINE CULTURE  RESP PANEL BY RT-PCR (FLU A&B, COVID) [id number]  HEPATIC FUNCTION PANEL  LIPASE, BLOOD  T4, FREE  TROPONIN I (HIGH SENSITIVITY)   ____________________________________________  EKG   EKG Interpretation  Date/Time:  Thursday August 12 2021 00:54:46 EDT Ventricular Rate:  82 PR Interval:  106 QRS Duration: 77 QT Interval:  393 QTC Calculation: 459 R Axis:   72 Text Interpretation: Sinus rhythm Atrial premature complexes Short PR interval Consider left ventricular hypertrophy Anterior Q waves, possibly due to LVH Confirmed by Pryor Curia [phone]) on 08/12/2021 12:55:52 AM        ____________________________________________  RADIOLOGY Jessie Foot Selma Mink, personally viewed and evaluated these images (plain radiographs) as part of my medical decision making, as well as reviewing the written report by the radiologist.  ED MD interpretation:  CXR clear.  Official radiology report(s): DG Chest Portable 1 View  Result Date: 08/11/2021 CLINICAL DATA:  Shortness of breath EXAM: PORTABLE CHEST 1 VIEW COMPARISON:  07/21/2021 FINDINGS: The heart size and mediastinal contours are within normal limits. Both lungs are clear. The visualized skeletal structures are unremarkable. IMPRESSION: No active disease.  Electronically Signed   By: Ulyses Jarred M.D.   On: 08/11/2021 23:46    ____________________________________________   PROCEDURES  Procedure(s) performed (including Critical Care):  Procedures    ____________________________________________   INITIAL IMPRESSION / ASSESSMENT AND PLAN / ED COURSE  As part of my medical decision making, I reviewed the following data within the [facility] History obtained from family, Nursing notes reviewed and incorporated, Labs reviewed , EKG interpreted , Old EKG reviewed, Old chart reviewed, Radiograph reviewed , Discussed with admitting physician , and Notes from prior ED visits       Patient here with failure to thrive.  May be secondary to depression, grief reaction.  She has having nausea, dry heaving.  Abdominal exam seems to be benign.  She did have a CT of her abdomen pelvis during her last admission which showed no acute findings.  She did have a small left adnexal mass and diverticulosis without diverticulitis.  Initial labs obtained in triage shows sodium level of 130 which is improved from last admission.  Creatinine is still within normal limits but has doubled compared to her baseline.  Other electrolytes normal.  Hemoglobin stable.  Differential also includes UTI, thyroid dysfunction, pneumonia, ACS.  Will obtain additional labs, EKG, urine, chest x-ray.  Will give IV fluids, Zofran.  ED PROGRESS  Patient appears to have a urinary tract infection.  Urine culture pending.  Will give Rocephin.  Rectal temp 99.9.  I suspect this could be contributing to her generalized weakness, failure to thrive, nausea.  Will discuss with medicine for admission.  EKG nonischemic.  Troponin negative.  TSH elevated.  We will add on free T4.  1:21 AM Discussed patient's case with hospitalist, Dr. Damita Dunnings.  I have recommended admission and patient (and family if present) agree with this plan. Admitting physician will place admission orders.    I reviewed all nursing notes, vitals, pertinent previous records and reviewed/interpreted all EKGs, lab and urine results, imaging (as available).  ____________________________________________   FINAL CLINICAL IMPRESSION(S) / ED DIAGNOSES  Final diagnoses:  Failure to thrive in adult  Acute UTI  Dehydration  Metabolic  acidosis  Nausea     ED Discharge Orders     None       *Please note:  SERA TERRANOVA was evaluated in Emergency Department on 08/12/2021 for the symptoms described in the history of present illness. She was evaluated in the context of the global COVID-19 pandemic, which necessitated consideration that the patient might be at risk for infection with the SARS-CoV-2 virus that causes COVID-19. Institutional protocols and algorithms that pertain to the evaluation of patients at risk for COVID-19 are in a state of rapid change based on information released by regulatory bodies including the CDC and federal and state organizations. These policies and algorithms were followed during the patient's care in the ED.  Some ED evaluations and interventions may be delayed as a result of limited staffing during and the pandemic.*   Note:  This document was prepared using Dragon voice recognition software and may include unintentional dictation errors.    Karter Haire, Delice Bison, DO 08/12/21 0121

## 2021-08-12 ENCOUNTER — Inpatient Hospital Stay: Payer: Medicare Other

## 2021-08-12 DIAGNOSIS — Z66 Do not resuscitate: Secondary | ICD-10-CM | POA: Diagnosis present

## 2021-08-12 DIAGNOSIS — Z923 Personal history of irradiation: Secondary | ICD-10-CM | POA: Diagnosis not present

## 2021-08-12 DIAGNOSIS — R638 Other symptoms and signs concerning food and fluid intake: Secondary | ICD-10-CM | POA: Diagnosis not present

## 2021-08-12 DIAGNOSIS — H9190 Unspecified hearing loss, unspecified ear: Secondary | ICD-10-CM | POA: Diagnosis present

## 2021-08-12 DIAGNOSIS — E042 Nontoxic multinodular goiter: Secondary | ICD-10-CM | POA: Diagnosis present

## 2021-08-12 DIAGNOSIS — E785 Hyperlipidemia, unspecified: Secondary | ICD-10-CM | POA: Diagnosis present

## 2021-08-12 DIAGNOSIS — Z8616 Personal history of COVID-19: Secondary | ICD-10-CM | POA: Diagnosis not present

## 2021-08-12 DIAGNOSIS — F4320 Adjustment disorder, unspecified: Secondary | ICD-10-CM | POA: Diagnosis present

## 2021-08-12 DIAGNOSIS — F32A Depression, unspecified: Secondary | ICD-10-CM

## 2021-08-12 DIAGNOSIS — E860 Dehydration: Secondary | ICD-10-CM | POA: Diagnosis present

## 2021-08-12 DIAGNOSIS — N390 Urinary tract infection, site not specified: Secondary | ICD-10-CM | POA: Diagnosis present

## 2021-08-12 DIAGNOSIS — R627 Adult failure to thrive: Secondary | ICD-10-CM | POA: Diagnosis present

## 2021-08-12 DIAGNOSIS — Z681 Body mass index (BMI) 19 or less, adult: Secondary | ICD-10-CM | POA: Diagnosis not present

## 2021-08-12 DIAGNOSIS — E039 Hypothyroidism, unspecified: Secondary | ICD-10-CM | POA: Diagnosis present

## 2021-08-12 DIAGNOSIS — E861 Hypovolemia: Secondary | ICD-10-CM | POA: Diagnosis present

## 2021-08-12 DIAGNOSIS — R531 Weakness: Secondary | ICD-10-CM

## 2021-08-12 DIAGNOSIS — I951 Orthostatic hypotension: Secondary | ICD-10-CM | POA: Diagnosis present

## 2021-08-12 DIAGNOSIS — E43 Unspecified severe protein-calorie malnutrition: Secondary | ICD-10-CM | POA: Diagnosis present

## 2021-08-12 DIAGNOSIS — Z85820 Personal history of malignant melanoma of skin: Secondary | ICD-10-CM | POA: Diagnosis not present

## 2021-08-12 DIAGNOSIS — Z79890 Hormone replacement therapy (postmenopausal): Secondary | ICD-10-CM | POA: Diagnosis not present

## 2021-08-12 DIAGNOSIS — E872 Acidosis: Secondary | ICD-10-CM | POA: Diagnosis present

## 2021-08-12 DIAGNOSIS — Z888 Allergy status to other drugs, medicaments and biological substances status: Secondary | ICD-10-CM | POA: Diagnosis not present

## 2021-08-12 DIAGNOSIS — Z9221 Personal history of antineoplastic chemotherapy: Secondary | ICD-10-CM | POA: Diagnosis not present

## 2021-08-12 DIAGNOSIS — E871 Hypo-osmolality and hyponatremia: Secondary | ICD-10-CM | POA: Diagnosis present

## 2021-08-12 DIAGNOSIS — Z79899 Other long term (current) drug therapy: Secondary | ICD-10-CM | POA: Diagnosis not present

## 2021-08-12 DIAGNOSIS — I82531 Chronic embolism and thrombosis of right popliteal vein: Secondary | ICD-10-CM | POA: Diagnosis present

## 2021-08-12 DIAGNOSIS — R600 Localized edema: Secondary | ICD-10-CM | POA: Diagnosis not present

## 2021-08-12 LAB — RESP PANEL BY RT-PCR (FLU A&B, COVID) ARPGX2
Influenza A by PCR: NEGATIVE
Influenza B by PCR: NEGATIVE
SARS Coronavirus 2 by RT PCR: POSITIVE — AB

## 2021-08-12 LAB — BASIC METABOLIC PANEL
Anion gap: 6 (ref 5–15)
BUN: 35 mg/dL — ABNORMAL HIGH (ref 8–23)
CO2: 21 mmol/L — ABNORMAL LOW (ref 22–32)
Calcium: 8 mg/dL — ABNORMAL LOW (ref 8.9–10.3)
Chloride: 102 mmol/L (ref 98–111)
Creatinine, Ser: 0.64 mg/dL (ref 0.44–1.00)
GFR, Estimated: >60 mL/min (ref >60)
Glucose, Bld: 85 mg/dL (ref 70–99)
Potassium: 3.3 mmol/L — ABNORMAL LOW (ref 3.5–5.1)
Sodium: 129 mmol/L — ABNORMAL LOW (ref 135–145)

## 2021-08-12 LAB — D-DIMER, QUANTITATIVE: D-Dimer, Quant: 4.69 ug/mL-FEU — ABNORMAL HIGH (ref 0.00–0.50)

## 2021-08-12 LAB — HEPATIC FUNCTION PANEL
ALT: 21 U/L (ref 0–44)
AST: 26 U/L (ref 15–41)
Albumin: 3.9 g/dL (ref 3.5–5.0)
Alkaline Phosphatase: 72 U/L (ref 38–126)
Bilirubin, Direct: 0.2 mg/dL (ref 0.0–0.2)
Indirect Bilirubin: 0.8 mg/dL (ref 0.3–0.9)
Total Bilirubin: 1 mg/dL (ref 0.3–1.2)
Total Protein: 6.7 g/dL (ref 6.5–8.1)

## 2021-08-12 LAB — URIC ACID: Uric Acid, Serum: 2.6 mg/dL (ref 2.5–7.1)

## 2021-08-12 LAB — CBC
HCT: 27.2 % — ABNORMAL LOW (ref 36.0–46.0)
Hemoglobin: 9.7 g/dL — ABNORMAL LOW (ref 12.0–15.0)
MCH: 31 pg (ref 26.0–34.0)
MCHC: 35.7 g/dL (ref 30.0–36.0)
MCV: 86.9 fL (ref 80.0–100.0)
Platelets: 219 10*3/uL (ref 150–400)
RBC: 3.13 MIL/uL — ABNORMAL LOW (ref 3.87–5.11)
RDW: 15.7 % — ABNORMAL HIGH (ref 11.5–15.5)
WBC: 8 10*3/uL (ref 4.0–10.5)
nRBC: 0 % (ref 0.0–0.2)

## 2021-08-12 LAB — SODIUM, URINE, RANDOM: Sodium, Ur: 104 mmol/L

## 2021-08-12 LAB — C-REACTIVE PROTEIN: CRP: 1.6 mg/dL — ABNORMAL HIGH (ref <1.0)

## 2021-08-12 LAB — TROPONIN I (HIGH SENSITIVITY): Troponin I (High Sensitivity): 9 ng/L (ref <18)

## 2021-08-12 LAB — BRAIN NATRIURETIC PEPTIDE: B Natriuretic Peptide: 112.3 pg/mL — ABNORMAL HIGH (ref 0.0–100.0)

## 2021-08-12 LAB — CREATININE, URINE, RANDOM: Creatinine, Urine: 91 mg/dL

## 2021-08-12 LAB — OSMOLALITY, URINE: Osmolality, Ur: 801 mOsm/kg (ref 300–900)

## 2021-08-12 LAB — T4, FREE: Free T4: 1.44 ng/dL — ABNORMAL HIGH (ref 0.61–1.12)

## 2021-08-12 LAB — LIPASE, BLOOD: Lipase: 47 U/L (ref 11–51)

## 2021-08-12 LAB — TSH: TSH: 4.924 u[IU]/mL — ABNORMAL HIGH (ref 0.350–4.500)

## 2021-08-12 LAB — MAGNESIUM: Magnesium: 2 mg/dL (ref 1.7–2.4)

## 2021-08-12 LAB — OSMOLALITY: Osmolality: 272 mOsm/kg — ABNORMAL LOW (ref 275–295)

## 2021-08-12 MED ORDER — LACTATED RINGERS IV SOLN: INTRAVENOUS | Status: DC

## 2021-08-12 MED ORDER — ACETAMINOPHEN 325 MG PO TABS: 650.0000 mg | ORAL_TABLET | Freq: Four times a day (QID) | ORAL | Status: DC | PRN

## 2021-08-12 MED ORDER — POTASSIUM CHLORIDE CRYS ER 20 MEQ PO TBCR
40.0000 meq | EXTENDED_RELEASE_TABLET | Freq: Once | ORAL | Status: AC
  Administered 2021-08-12: 40 meq via ORAL
  Filled 2021-08-12: qty 2

## 2021-08-12 MED ORDER — DULOXETINE HCL 30 MG PO CPEP
30.0000 mg | ORAL_CAPSULE | Freq: Every day | ORAL | Status: DC
  Administered 2021-08-12 – 2021-08-14 (×3): 30 mg via ORAL
  Filled 2021-08-12 (×3): qty 1

## 2021-08-12 MED ORDER — ONDANSETRON HCL 4 MG PO TABS: 4.0000 mg | ORAL_TABLET | Freq: Four times a day (QID) | ORAL | Status: DC | PRN

## 2021-08-12 MED ORDER — PROSOURCE PLUS PO LIQD
30.0000 mL | Freq: Two times a day (BID) | ORAL | Status: DC
  Administered 2021-08-13 – 2021-08-14 (×3): 30 mL via ORAL
  Filled 2021-08-12 (×5): qty 30

## 2021-08-12 MED ORDER — SENNOSIDES-DOCUSATE SODIUM 8.6-50 MG PO TABS: 1.0000 | ORAL_TABLET | Freq: Every evening | ORAL | Status: DC | PRN

## 2021-08-12 MED ORDER — SODIUM CHLORIDE 0.9 % IV SOLN: INTRAVENOUS | Status: DC

## 2021-08-12 MED ORDER — MEGESTROL ACETATE 20 MG PO TABS
40.0000 mg | ORAL_TABLET | Freq: Every day | ORAL | Status: DC
  Administered 2021-08-12 – 2021-08-14 (×3): 40 mg via ORAL
  Filled 2021-08-12 (×3): qty 2

## 2021-08-12 MED ORDER — ONDANSETRON HCL 4 MG/2ML IJ SOLN: 4.0000 mg | Freq: Four times a day (QID) | INTRAMUSCULAR | Status: DC | PRN

## 2021-08-12 MED ORDER — ENSURE ENLIVE PO LIQD
237.0000 mL | Freq: Two times a day (BID) | ORAL | Status: DC
  Administered 2021-08-12: 237 mL via ORAL

## 2021-08-12 MED ORDER — SODIUM CHLORIDE 0.9 % IV SOLN
1.0000 g | INTRAVENOUS | Status: DC
  Administered 2021-08-13 – 2021-08-14 (×2): 1 g via INTRAVENOUS
  Filled 2021-08-12 (×2): qty 10

## 2021-08-12 MED ORDER — LEVOTHYROXINE SODIUM 25 MCG PO TABS
25.0000 ug | ORAL_TABLET | Freq: Every day | ORAL | Status: DC
  Administered 2021-08-14: 06:00:00 25 ug via ORAL
  Filled 2021-08-12 (×2): qty 1

## 2021-08-12 MED ORDER — ACETAMINOPHEN 650 MG RE SUPP: 650.0000 mg | Freq: Four times a day (QID) | RECTAL | Status: DC | PRN

## 2021-08-12 MED ORDER — MIRTAZAPINE 15 MG PO TBDP
15.0000 mg | ORAL_TABLET | Freq: Every day | ORAL | Status: DC
  Administered 2021-08-12: 15 mg via ORAL
  Filled 2021-08-12 (×2): qty 1

## 2021-08-12 MED ORDER — ENOXAPARIN SODIUM 30 MG/0.3ML IJ SOSY
30.0000 mg | PREFILLED_SYRINGE | INTRAMUSCULAR | Status: DC
  Administered 2021-08-12 – 2021-08-13 (×2): 30 mg via SUBCUTANEOUS
  Filled 2021-08-12 (×2): qty 0.3

## 2021-08-12 NOTE — ED Notes (Signed)
Pt requested need for assistance with restroom. Pt able to sit up, stand and walk on own accord to restroom across room. Pt walked with steady gait. Pt now resting in bed with son at bedside. Pt vitals stable at this time.

## 2021-08-12 NOTE — Evaluation (Addendum)
Occupational Therapy Evaluation Patient Details Name: Krystal Richardson MRN: [id number] DOB: [birth year] Today's Date: 08/12/2021    History of Present Illness 87 yo F who comes to [facility] on 8/17 with ongoing weakness, poor PO intake, dysuria. Pt here for c FTT 3 weeks ago for similar compalint related to covid infection. Per her son, patient has been unwell for the more than 5 weeks since the passing of her husband. Pt has had anorexia, nausea, vomitting, poor PO intake, now referred from her PCP because she has had a 16 pound weight loss in the last 2 weeks. PMH: HLD, hypoTSH, basal cell carcinoma, melanoma s/p radiation, gastric ulcer, HOH.   Clinical Impression   Pt seen for OT evaluation this date. Upon arrival to room, pt asleep in bed however easily awoken. Pt oriented to self, however PLOF difficult to obtain and cognition difficult to formally assess d/t pt mumbling throughout session and no family/caregiver present to determine PLOF/baseline cognition. Per chart review, pt was MOD-I for ADLs, but has started to decline over past week following late husband's birthday. Of note, pt's husband passed away last month.  Pt currently presents with decreased strength and balance, and requires MIN A for bed mobility, SUPERVISION/SET-UP assist for seated UB ADLs, and MIN A for sit<>stand transfers. With MIN A and b/l UE support, pt was able to tolerate standing for ~30 sec, however declined further functional mobility and requested to return to bed; pt groaning during functional mobility, however unable to verbalize if/where she was in pain. RN informed. Per discussion with treatment team, pt has been walking around ED room with SUPERVISION/MIN A. Pt would benefit from additional skilled OT services to maximize return to PLOF and minimize risk of future falls, injury, caregiver burden, and readmission. Upon discharge, recommend HHOT and 24/7 supervision/assistance.     Follow Up Recommendations  Home health  OT;Supervision/Assistance - 24 hour    Equipment Recommendations  None recommended by OT       Precautions / Restrictions Precautions Precautions: Fall Restrictions Weight Bearing Restrictions: No      Mobility Bed Mobility Overal bed mobility: Needs Assistance Bed Mobility: Supine to Sit;Sit to Supine     Supine to sit: Min assist Sit to supine: Supervision   General bed mobility comments: Requires MIN A for trunk support during supine>sit with HOB slightly elevated. No physical assist for sit>supine    Transfers Overall transfer level: Needs assistance Equipment used: 1 person hand held assist Transfers: Sit to/from Stand Sit to Stand: Min assist         General transfer comment: MIN A to steady upon standing    Balance Overall balance assessment: Needs assistance Sitting-balance support: No upper extremity supported;Feet supported Sitting balance-Leahy Scale: Good Sitting balance - Comments: Good static sitting balance at EOB   Standing balance support: Bilateral upper extremity supported;During functional activity Standing balance-Leahy Scale: Poor Standing balance comment: MIN A to maintain static standing balance                           ADL either performed or assessed with clinical judgement   ADL Overall ADL's : Needs assistance/impaired                                       General ADL Comments: SUPERVISION for seated UB ADLs. Anticipate pt requires MIN A for standing  ADLs         Hand Dominance Right   Extremity/Trunk Assessment Upper Extremity Assessment Upper Extremity Assessment: Generalized weakness   Lower Extremity Assessment Lower Extremity Assessment: Generalized weakness       Communication Communication Communication: No difficulties   Cognition Arousal/Alertness: Lethargic Behavior During Therapy: Flat affect Overall Cognitive Status: No family/caregiver present to determine baseline cognitive  functioning                                 General Comments: Cognition difficult to assess d/t pt mumbling throughout session. Pt was oriented to self and appeared to be in discomfort, but unable to state if/where she was in pain              Home Living Family/patient expects to be discharged to:: Private residence Living Arrangements: Alone;Other (Comment) (husband recently passed about July 05, 2021) Available Help at Discharge: Family;Available 24 hours/day (Has 24/7 caregiver assistance since prior DC, housework last admission.) Type of Home: House Home Access: Stairs to enter [employer] of Steps: 3 Entrance Stairs-Rails: None Home Layout: One level     Bathroom Shower/Tub: Walk-in shower         Home Equipment: Environmental consultant - 2 wheels;Cane - single point;Tub bench;Bedside commode   Additional Comments: [facility] recently brought in tub bench which she does not like.      Prior Functioning/Environment Level of Independence: Needs assistance  Gait / Transfers Assistance Needed: household distances with RW, ADL's / Homemaking Assistance Needed: Was getting help with bathing PTA, modI dressing, bathing, but started to decline 2023-08-27 after deceased husband's birthday.   Comments: had syncopal episode with Marlowe Alt on day of DC prior admission, then similar epidose next day with other son [name].        OT Problem List: Decreased activity tolerance;Impaired balance (sitting and/or standing);Decreased knowledge of use of DME or AE;Decreased strength      OT Treatment/Interventions: Self-care/ADL training;Therapeutic exercise;Therapeutic activities;Energy conservation;DME and/or AE instruction;Patient/family education;Balance training    OT Goals(Current goals can be found in the care plan section) Acute Rehab OT Goals Patient Stated Goal: none stated Time For Goal Achievement: 08/26/21  OT Frequency: Min 1X/week    AM-PAC OT "6 Clicks" Daily Activity      Outcome Measure Help from another person eating meals?: None Help from another person taking care of personal grooming?: A Little Help from another person toileting, which includes using toliet, bedpan, or urinal?: A Little Help from another person bathing (including washing, rinsing, drying)?: A Lot Help from another person to put on and taking off regular upper body clothing?: A Little Help from another person to put on and taking off regular lower body clothing?: A Lot 6 Click Score: 17   End of Session Equipment Utilized During Treatment: Gait belt Nurse Communication: Mobility status  Activity Tolerance: Patient limited by fatigue Patient left: in bed;with call bell/phone within reach  OT Visit Diagnosis: Other abnormalities of gait and mobility (R26.89);Muscle weakness (generalized) (M62.81)                Time: [id number] OT Time Calculation (min): 16 min Charges:  OT General Charges $OT Visit: 1 Visit OT Evaluation $OT Eval Moderate Complexity: 1 Mod OT Treatments $Self Care/Home Management : 8-22 mins  Fredirick Maudlin, OTR/L [facility]

## 2021-08-12 NOTE — Consult Note (Signed)
[facility] Face-to-Face Psychiatry Consult   Reason for Consult: Consult for 87 year old woman brought into the hospital with continued weight loss and poor appetite weakness general malaise depressed mood Referring Physician:  Candiss Norse Patient Identification: Krystal Richardson MRN:  [id number] Principal Diagnosis: Depression Diagnosis:  Principal Problem:   Depression Active Problems:   Hyponatremia   Hypothyroidism   Failure to thrive in adult   Lab test positive for detection of COVID-19 virus   UTI (urinary tract infection)   Decreased oral intake   Generalized weakness   Total Time spent with patient: 1 hour  Subjective:   Krystal Richardson is a 87 y.o. female patient admitted with "I was trying to eat".  HPI: Patient seen chart reviewed.  Spoke with her son as well who was at bedside.  28 year old woman whose husband died about a month ago.  Soon thereafter she stopped eating normally became more withdrawn and sad and depressed.  Lost significant weight.  Patient came into the hospital and saw her primary care doctor and was started on some antidepressant medicine.  Son reports that she began eating better and as soon as she did so her energy level and overall affect improved but that this then declined again possibly due to her urinary tract infection.  He says that she has eaten almost nothing for about a day.  Not sleeping well.  Energy level low.  Denies any thoughts of suicide.  No report of any hallucinations.  Past Psychiatric History: No identified previous psychiatric history before this recent episode.  Risk to Self:   Risk to Others:   Prior Inpatient Therapy:   Prior Outpatient Therapy:    Past Medical History:  Past Medical History:  Diagnosis Date   Actinic keratosis 03/12/2019   mid post scalp melanoma graft scar   Basal cell carcinoma 08/04/2014   R forehead - excision 08/19/2014   BCC (basal cell carcinoma of skin) 02/20/2019   L dorsum foot   Hyperlipidemia    Melanoma  ([place]) 2002   with chemo and rad tx   Personal history of chemotherapy 2003   MELANOMA   Personal history of radiation therapy 2002   MELANOMA   Stomach ulcer    Urethral caruncle     Past Surgical History:  Procedure Laterality Date   BREAST EXCISIONAL BIOPSY Left 1986   EXCISIONAL - NEG   cataract     hemmriodectomy  1980   melanoma  2002   Family History:  Family History  Problem Relation Age of Onset   Kidney disease Neg Hx    Bladder Cancer Neg Hx    Prostate cancer Neg Hx    Breast cancer Neg Hx    Family Psychiatric  History: None reported Social History:  Social History   Substance and Sexual Activity  Alcohol Use No   Alcohol/week: 0.0 standard drinks     Social History   Substance and Sexual Activity  Drug Use No    Social History   Socioeconomic History   Marital status: Widowed    Spouse name: Not on file   Number of children: Not on file   Years of education: Not on file   Highest education level: Not on file  Occupational History   Not on file  Tobacco Use   Smoking status: Never   Smokeless tobacco: Never  Substance and Sexual Activity   Alcohol use: No    Alcohol/week: 0.0 standard drinks   Drug use: No   Sexual  activity: Not on file  Other Topics Concern   Not on file  Social History Narrative   Not on file   Social Determinants of Health   Financial Resource Strain: Not on file  Food Insecurity: Not on file  Transportation Needs: Not on file  Physical Activity: Not on file  Stress: Not on file  Social Connections: Not on file   Additional Social History:    Allergies:   Allergies  Allergen Reactions   Toprol Xl [Metoprolol Tartrate]     Labs:  Results for orders placed or performed during the hospital encounter of 08/11/21 (from the past 48 hour(s))  CBC     Status: Abnormal   Collection Time: 08/11/21  4:42 PM  Result Value Ref Range   WBC 7.9 4.0 - 10.5 K/uL   RBC 4.11 3.87 - 5.11 MIL/uL   Hemoglobin 12.6 12.0 -  15.0 g/dL   HCT 35.2 (L) 36.0 - 46.0 %   MCV 85.6 80.0 - 100.0 fL   MCH 30.7 26.0 - 34.0 pg   MCHC 35.8 30.0 - 36.0 g/dL   RDW 15.8 (H) 11.5 - 15.5 %   Platelets 271 150 - 400 K/uL   nRBC 0.0 0.0 - 0.2 %    Comment: Performed at [facility], [address] [place], [place] [id number]  Basic metabolic panel     Status: Abnormal   Collection Time: 08/11/21  4:42 PM  Result Value Ref Range   Sodium 130 (L) 135 - 145 mmol/L   Potassium 3.6 3.5 - 5.1 mmol/L   Chloride 101 98 - 111 mmol/L   CO2 18 (L) 22 - 32 mmol/L   Glucose, Bld 127 (H) 70 - 99 mg/dL    Comment: Glucose reference range applies only to samples taken after fasting for at least 8 hours.   BUN 43 (H) 8 - 23 mg/dL   Creatinine, Ser 0.84 0.44 - 1.00 mg/dL   Calcium 9.3 8.9 - 10.3 mg/dL   GFR, Estimated >60 >60 mL/min    Comment: (NOTE) Calculated using the CKD-EPI Creatinine Equation (2021)    Anion gap 11 5 - 15    Comment: Performed at [facility], [place], [place] [address]  Troponin I (High Sensitivity)     Status: None   Collection Time: 08/11/21  4:42 PM  Result Value Ref Range   Troponin I (High Sensitivity) 9 <18 ng/L    Comment: (NOTE) Elevated high sensitivity troponin I (hsTnI) values and significant  changes across serial measurements may suggest ACS but many other  chronic and acute conditions are known to elevate hsTnI results.  Refer to the "Links" section for chest pain algorithms and additional  guidance. Performed at [facility], [address] [place], [place] [address]   TSH     Status: Abnormal   Collection Time: 08/11/21  4:42 PM  Result Value Ref Range   TSH 4.924 (H) 0.350 - 4.500 uIU/mL    Comment: Performed by a 3rd Generation assay with a functional sensitivity of <=0.01 uIU/mL. Performed at [facility], [address] [place], [place] [address]   Hepatic function panel     Status: None   Collection Time: 08/11/21  4:42 PM   Result Value Ref Range   Total Protein 6.7 6.5 - 8.1 g/dL   Albumin 3.9 3.5 - 5.0 g/dL   AST 26 15 - 41 U/L   ALT 21 0 - 44 U/L  Alkaline Phosphatase 72 38 - 126 U/L   Total Bilirubin 1.0 0.3 - 1.2 mg/dL   Bilirubin, Direct 0.2 0.0 - 0.2 mg/dL   Indirect Bilirubin 0.8 0.3 - 0.9 mg/dL    Comment: Performed at [facility], [address] [place], [place] [address]  Lipase, blood     Status: None   Collection Time: 08/11/21  4:42 PM  Result Value Ref Range   Lipase 47 11 - 51 U/L    Comment: Performed at [facility], [address] [place], [place] [address]  Urinalysis, Complete w Microscopic Urine, Clean Catch     Status: Abnormal   Collection Time: 08/11/21 10:28 PM  Result Value Ref Range   Color, Urine AMBER (A) YELLOW    Comment: BIOCHEMICALS MAY BE AFFECTED BY COLOR   APPearance CLOUDY (A) CLEAR   Specific Gravity, Urine 1.025 1.005 - 1.030   pH 5.0 5.0 - 8.0   Glucose, UA NEGATIVE NEGATIVE mg/dL   Hgb urine dipstick SMALL (A) NEGATIVE   Bilirubin Urine NEGATIVE NEGATIVE   Ketones, ur 80 (A) NEGATIVE mg/dL   Protein, ur 100 (A) NEGATIVE mg/dL   Nitrite NEGATIVE NEGATIVE   Leukocytes,Ua MODERATE (A) NEGATIVE   RBC / HPF 21-50 0 - 5 RBC/hpf   WBC, UA >50 (H) 0 - 5 WBC/hpf   Bacteria, UA MANY (A) NONE SEEN   Squamous Epithelial / LPF 0-5 0 - 5   WBC Clumps PRESENT    Mucus PRESENT     Comment: Performed at [facility], [address] [place], [place] [address]  Sodium, urine, random     Status: None   Collection Time: 08/11/21 11:35 PM  Result Value Ref Range   Sodium, Ur 104 mmol/L    Comment: Performed at [facility], [address] [place], [place] [address]  Osmolality, urine     Status: None   Collection Time: 08/11/21 11:35 PM  Result Value Ref Range   Osmolality, Ur 801 300 - 900 mOsm/kg    Comment: Performed at [facility], [address] [place], [place] [address]  Creatinine, urine, random      Status: None   Collection Time: 08/11/21 11:35 PM  Result Value Ref Range   Creatinine, Urine 91 mg/dL    Comment: Performed at [facility], [address] [place], [place] [address]  Resp Panel by RT-PCR (Flu A&B, Covid) Nasopharyngeal Swab     Status: Abnormal   Collection Time: 08/12/21 12:25 AM   Specimen: Nasopharyngeal Swab; Nasopharyngeal(NP) swabs in vial transport medium  Result Value Ref Range   SARS Coronavirus 2 by RT PCR POSITIVE (A) NEGATIVE    Comment: RESULT CALLED TO, READ BACK BY AND VERIFIED WITH: TIA BULLOCK '@[id number]'$  ON 08/12/21 SKL (NOTE) SARS-CoV-2 target nucleic acids are DETECTED.  The SARS-CoV-2 RNA is generally detectable in upper respiratory specimens during the acute phase of infection. Positive results are indicative of the presence of the identified virus, but do not rule out bacterial infection or co-infection with other pathogens not detected by the test. Clinical correlation with patient history and other diagnostic information is necessary to determine patient infection status. The expected result is Negative.  Fact Sheet for Patients: EntrepreneurPulse.com.au  Fact Sheet for Healthcare Providers: IncredibleEmployment.be  This test is not yet approved or cleared by the [place] FDA and  has been authorized for detection and/or diagnosis of SARS-CoV-2 by FDA under an Emergency Use Authorization (EUA).  This EUA  will remain in effect (meaning this test can be  used) for the duration of  the COVID-19 declaration under Section 564(b)(1) of the Act, 21 U.S.C. section 360bbb-3(b)(1), unless the authorization is terminated or revoked sooner.     Influenza A by PCR NEGATIVE NEGATIVE   Influenza B by PCR NEGATIVE NEGATIVE    Comment: (NOTE) The Xpert Xpress SARS-CoV-2/FLU/RSV plus assay is intended as an aid in the diagnosis of influenza from Nasopharyngeal swab specimens and should not be used as a  sole basis for treatment. Nasal washings and aspirates are unacceptable for Xpert Xpress SARS-CoV-2/FLU/RSV testing.  Fact Sheet for Patients: EntrepreneurPulse.com.au  Fact Sheet for Healthcare Providers: IncredibleEmployment.be  This test is not yet approved or cleared by the [place] FDA and has been authorized for detection and/or diagnosis of SARS-CoV-2 by FDA under an Emergency Use Authorization (EUA). This EUA will remain in effect (meaning this test can be used) for the duration of the COVID-19 declaration under Section 564(b)(1) of the Act, 21 U.S.C. section 360bbb-3(b)(1), unless the authorization is terminated or revoked.  Performed at [facility], [address] [place], [place] [address]   T4, free     Status: Abnormal   Collection Time: 08/12/21 12:25 AM  Result Value Ref Range   Free T4 1.44 (H) 0.61 - 1.12 ng/dL    Comment: (NOTE) Biotin ingestion may interfere with free T4 tests. If the results are inconsistent with the TSH level, previous test results, or the clinical presentation, then consider biotin interference. If needed, order repeat testing after stopping biotin. Performed at [facility], [address] [place], [place] [id number]   Basic metabolic panel     Status: Abnormal   Collection Time: 08/12/21  5:46 AM  Result Value Ref Range   Sodium 129 (L) 135 - 145 mmol/L   Potassium 3.3 (L) 3.5 - 5.1 mmol/L   Chloride 102 98 - 111 mmol/L   CO2 21 (L) 22 - 32 mmol/L   Glucose, Bld 85 70 - 99 mg/dL    Comment: Glucose reference range applies only to samples taken after fasting for at least 8 hours.   BUN 35 (H) 8 - 23 mg/dL   Creatinine, Ser 0.64 0.44 - 1.00 mg/dL   Calcium 8.0 (L) 8.9 - 10.3 mg/dL   GFR, Estimated >60 >60 mL/min    Comment: (NOTE) Calculated using the CKD-EPI Creatinine Equation (2021)    Anion gap 6 5 - 15    Comment: Performed at [facility], [address] [place], [place] [address]  CBC     Status: Abnormal   Collection Time: 08/12/21  5:46 AM  Result Value Ref Range   WBC 8.0 4.0 - 10.5 K/uL   RBC 3.13 (L) 3.87 - 5.11 MIL/uL   Hemoglobin 9.7 (L) 12.0 - 15.0 g/dL   HCT 27.2 (L) 36.0 - 46.0 %   MCV 86.9 80.0 - 100.0 fL   MCH 31.0 26.0 - 34.0 pg   MCHC 35.7 30.0 - 36.0 g/dL   RDW 15.7 (H) 11.5 - 15.5 %   Platelets 219 150 - 400 K/uL   nRBC 0.0 0.0 - 0.2 %    Comment: Performed at [facility], [address], [place], [place] [address]  Brain natriuretic peptide     Status: Abnormal   Collection Time: 08/12/21  7:23 AM  Result Value Ref Range   B Natriuretic Peptide 112.3 (H) 0.0 - 100.0 pg/mL    Comment:  Performed at [facility], [address] [place], [place] [address]  Magnesium     Status: None   Collection Time: 08/12/21  8:23 AM  Result Value Ref Range   Magnesium 2.0 1.7 - 2.4 mg/dL    Comment: Performed at [facility], [address] [place], [place] [address]  Uric acid     Status: None   Collection Time: 08/12/21  8:23 AM  Result Value Ref Range   Uric Acid, Serum 2.6 2.5 - 7.1 mg/dL    Comment: Performed at [facility], [address] [place], [place] [address]  Osmolality     Status: Abnormal   Collection Time: 08/12/21  8:23 AM  Result Value Ref Range   Osmolality 272 (L) 275 - 295 mOsm/kg    Comment: Performed at [facility], [address] [place], [place] [address]  C-reactive protein     Status: Abnormal   Collection Time: 08/12/21  8:23 AM  Result Value Ref Range   CRP 1.6 (H) <1.0 mg/dL    Comment: Performed at [facility], [place] [address], [place],  [address]  D-dimer, quantitative     Status: Abnormal   Collection Time: 08/12/21  8:23 AM  Result Value Ref Range   D-Dimer, Quant 4.69 (H) 0.00 - 0.50 ug/mL-FEU    Comment: (NOTE) At the manufacturer cut-off value of 0.5 g/mL FEU, this assay has a negative predictive value of  95-100%.This assay is intended for use in conjunction with a clinical pretest probability (PTP) assessment model to exclude pulmonary embolism (PE) and deep venous thrombosis (DVT) in outpatients suspected of PE or DVT. Results should be correlated with clinical presentation. Performed at [facility], [address] [place],  [address]     Current Facility-Administered Medications  Medication Dose Route Frequency Provider Last Rate Last Admin   (feeding supplement) PROSource Plus liquid 30 mL  30 mL Oral BID BM Lala Lund K, MD       0.9 %  sodium chloride infusion   Intravenous Continuous Thurnell Lose, MD 100 mL/hr at 08/12/21 1220 New Bag at 08/12/21 1220   acetaminophen (TYLENOL) tablet 650 mg  650 mg Oral Q6H PRN Athena Masse, MD       Or   acetaminophen (TYLENOL) suppository 650 mg  650 mg Rectal Q6H PRN Athena Masse, MD       [START ON 08/13/2021] cefTRIAXone (ROCEPHIN) 1 g in sodium chloride 0.9 % 100 mL IVPB  1 g Intravenous Q24H Judd Gaudier V, MD       DULoxetine (CYMBALTA) DR capsule 30 mg  30 mg Oral Daily Judd Gaudier V, MD   30 mg at 08/12/21 1030   enoxaparin (LOVENOX) injection 30 mg  30 mg Subcutaneous Q24H Athena Masse, MD       feeding supplement (ENSURE ENLIVE / ENSURE PLUS) liquid 237 mL  237 mL Oral BID BM Judd Gaudier V, MD   237 mL at 08/12/21 1328   lactated ringers infusion   Intravenous Continuous Thurnell Lose, MD 100 mL/hr at 08/12/21 1029 New Bag at 08/12/21 1029   [START ON 08/13/2021] levothyroxine (SYNTHROID) tablet 25 mcg  25 mcg Oral Q0600 Thurnell Lose, MD       megestrol (MEGACE) tablet 40 mg  40 mg Oral Daily Judd Gaudier V, MD   40 mg at 08/12/21 1030   mirtazapine (REMERON SOL-TAB) disintegrating tablet 15 mg  15 mg Oral  QHS Athena Masse, MD       ondansetron [facility]) injection 4 mg  4 mg Intravenous Q6H PRN Athena Masse, MD       senna-docusate (Senokot-S) tablet 1 tablet  1 tablet Oral QHS PRN  Athena Masse, MD       Current Outpatient Medications  Medication Sig Dispense Refill   DULoxetine (CYMBALTA) 30 MG capsule Take 1 capsule (30 mg total) by mouth daily. 30 capsule 0   feeding supplement (ENSURE ENLIVE / ENSURE PLUS) LIQD Take 237 mLs by mouth 2 (two) times daily between meals. 237 mL 12   levothyroxine (SYNTHROID, LEVOTHROID) 25 MCG tablet Take 25 mcg by mouth daily before breakfast.     megestrol (MEGACE) 40 MG tablet Take 1 tablet (40 mg total) by mouth daily. 30 tablet 0   mirtazapine (REMERON SOL-TAB) 15 MG disintegrating tablet Take 1 tablet (15 mg total) by mouth at bedtime. 30 tablet 0    Musculoskeletal: Strength & Muscle Tone: decreased Gait & Station:  Not tested.  Apparently at baseline she was ambulatory Patient leans: N/A            Psychiatric Specialty Exam:  Presentation  General Appearance:  No data recorded Eye Contact: No data recorded Speech: No data recorded Speech Volume: No data recorded Handedness: No data recorded  Mood and Affect  Mood: No data recorded Affect: No data recorded  Thought Process  Thought Processes: No data recorded Descriptions of Associations:No data recorded Orientation:No data recorded Thought Content:No data recorded History of Schizophrenia/Schizoaffective disorder:No data recorded Duration of Psychotic Symptoms:No data recorded Hallucinations:No data recorded Ideas of Reference:No data recorded Suicidal Thoughts:No data recorded Homicidal Thoughts:No data recorded  Sensorium  Memory: No data recorded Judgment: No data recorded Insight: No data recorded  Executive Functions  Concentration: No data recorded Attention Span: No data recorded Recall: No data recorded Fund of Knowledge: No data recorded Language: No data recorded  Psychomotor Activity  Psychomotor Activity: No data recorded  Assets  Assets: No data recorded  Sleep  Sleep: No data recorded  Physical  Exam: Physical Exam Vitals and nursing note reviewed.  Constitutional:      Appearance: Normal appearance. She is underweight.  HENT:     Head: Normocephalic and atraumatic.     Ears:     Comments: She is hard of hearing    Mouth/Throat:     Pharynx: Oropharynx is clear.  Eyes:     Pupils: Pupils are equal, round, and reactive to light.  Cardiovascular:     Rate and Rhythm: Normal rate and regular rhythm.  Pulmonary:     Effort: Pulmonary effort is normal.     Breath sounds: Normal breath sounds.  Abdominal:     General: Abdomen is flat.     Palpations: Abdomen is soft.  Musculoskeletal:        General: Normal range of motion.  Skin:    General: Skin is warm and dry.  Neurological:     General: No focal deficit present.     Mental Status: She is alert. Mental status is at baseline.  Psychiatric:        Attention and Perception: Attention normal.        Mood and Affect: Affect is blunt.        Speech: Speech is delayed.        Behavior: Behavior is slowed.        Thought Content: Thought content normal.   Review of  Systems  Constitutional:  Positive for malaise/fatigue and weight loss.  Psychiatric/Behavioral:  Positive for depression. Negative for hallucinations, substance abuse and suicidal ideas. The patient is not nervous/anxious.   Blood pressure (!) 148/81, pulse 80, temperature 99.9 F (37.7 C), temperature source (S) Rectal, resp. rate 18, height '5\' 3"'$  (1.6 m), weight 39 kg, SpO2 99 %. Body mass index is 15.23 kg/m.  Treatment Plan Summary: Medication management and Plan patient had been started on low dose of mirtazapine and Cymbalta.  It is possible that some of the GI upset she is experiencing could be related to Cymbalta regular Seroquel other causes of this as well so I would not change it.  I do suggest that increasing the mirtazapine to 30 mg would be reasonable as this may improve its effect on appetite and be more effective for depression.  Patient and son  agree.  We will follow up as needed.  Education provided to the patient about the life sustaining nature of needing to make herself eat.  Disposition: Patient does not meet criteria for psychiatric inpatient admission. Supportive therapy provided about ongoing stressors.  Alethia Berthold, MD 08/12/2021 3:24 PM

## 2021-08-12 NOTE — Evaluation (Signed)
Physical Therapy Evaluation Patient Details Name: Krystal Richardson MRN: [id number] DOB: 1929/03/14 Today's Date: 08/12/2021   History of Present Illness  Krystal Richardson is a 81yoF who comes to [facility] on 8/17 c ongoign weakness, poor PO intake, dysuria. Pt here for c FTT 3 weeks ago for similar compalint related to covid infection. Per her son, patient has been unwell for the more than 5 weeks since the passing of her husband. Pt has had annorexia, nausea s vomitting, poor PO intake, now referred from her PCP because she has had a 16 pound weight loss in the last 2 weeks. PMH: HLD, hypoTSH, basal cell carcinoma, melanoma s/p radiation, gastric ulcer, HOH.  Clinical Impression  Pt admitted with above diagnosis. Pt currently with functional limitations due to the deficits listed below (see "PT Problem List"). Upon entry, pt in bed, asleep, son at bedside who provides HPI and PLOF. Margorie Richardson reveals 2 episodes after DC last admission wherein the pt sustained a global loss of tone, loss of consciousness, and required full assistance for safe return to a position other than standing. Author reveals concerns to Son regarding potential orthostatic syncope in the setting of advanced age, recent COVID infection, and new medications for depression. MD also made aware of concerns. Patient's performance this date reveals decreased ability, independence, and tolerance in performing all basic mobility required for performance of activities of daily living. Pt requires additional DME, close physical assistance, and cues for safe participate in mobility. Pt remains listless, flat affect, and hypoaffective which is impacting for overall function. Pt has 24/7 caregiver assist at home, was formerly making excellent progress with [facility] services; I fear moving to rehab facility at DC would create more mental health insult. Son is agreeable that return to home is the best option at this time. Pt will benefit from skilled PT intervention to  increase independence and safety with basic mobility in preparation for discharge to the venue listed below.       Follow Up Recommendations Home health PT;Supervision for mobility/OOB    Equipment Recommendations  None recommended by PT    Recommendations for Other Services       Precautions / Restrictions Precautions Precautions: Fall Precaution Comments: orthostatic syncope history recently Restrictions Weight Bearing Restrictions: No      Mobility  Bed Mobility Overal bed mobility: Needs Assistance Bed Mobility: Supine to Sit;Sit to Supine     Supine to sit: Min assist Sit to supine: Supervision   General bed mobility comments: Requires MIN A for trunk support during supine>sit with HOB slightly elevated. No physical assist for sit>supine    Transfers Overall transfer level: Needs assistance Equipment used: 1 person hand held assist Transfers: Sit to/from Stand Sit to Stand: Min assist         General transfer comment: MIN A to steady upon standing  Ambulation/Gait Ambulation/Gait assistance: Min guard;Min assist Gait Distance (Feet): 15 Feet Assistive device: 1 person hand held assist       General Gait Details: AMB to/from toilet with family and staff throughout the morning  Stairs            Wheelchair Mobility    Modified Rankin (Stroke Patients Only)       Balance Overall balance assessment: History of Falls Sitting-balance support: No upper extremity supported;Feet supported Sitting balance-Leahy Scale: Good Sitting balance - Comments: Good static sitting balance at EOB   Standing balance support: Bilateral upper extremity supported;During functional activity Standing balance-Leahy Scale: Poor Standing balance comment:  MIN A to maintain static standing balance                             Pertinent Vitals/Pain Pain Assessment: No/denies pain    Home Living Family/patient expects to be discharged to:: Private  residence Living Arrangements: Alone;Other (Comment) (husband recently passed about July 05, 2021) Available Help at Discharge: Family;Available 24 hours/day (Has 24/7 caregiver assistance since prior DC, housework last admission.) Type of Home: House Home Access: Stairs to enter Entrance Stairs-Rails: None Entrance Stairs-Number of Steps: 3 Home Layout: One level Home Equipment: Environmental consultant - 2 wheels;Cane - single point;Tub bench;Bedside commode Additional Comments: [facility] recently brought in tub bench which she does not like.    Prior Function Level of Independence: Needs assistance   Gait / Transfers Assistance Needed: household distances with RW,  ADL's / Homemaking Assistance Needed: Was getting help with bathing PTA, modI dressing, bathing, but started to decline September 03, 2023 after deceased husband's birthday.  Comments: had syncopal episode with Marlowe Alt on day of DC prior admission, then similar epidose next day with other son [name].     Hand Dominance   Dominant Hand: Right    Extremity/Trunk Assessment   Upper Extremity Assessment Upper Extremity Assessment: Generalized weakness    Lower Extremity Assessment Lower Extremity Assessment: Generalized weakness       Communication   Communication: No difficulties  Cognition Arousal/Alertness: Lethargic Behavior During Therapy: Flat affect Overall Cognitive Status: No family/caregiver present to determine baseline cognitive functioning                                 General Comments: Cognition difficult to assess d/t pt mumbling throughout session. Pt was oriented to self and appeared to be in discomfort, but unable to state if/where she was in pain      General Comments      Exercises     Assessment/Plan    PT Assessment Patient needs continued PT services  PT Problem List Decreased strength;Decreased activity tolerance;Decreased mobility       PT Treatment Interventions Gait training;Therapeutic  exercise;Therapeutic activities;Balance training;Neuromuscular re-education;Functional mobility training;DME instruction;Stair training;Patient/family education    PT Goals (Current goals can be found in the Care Plan section)  Acute Rehab PT Goals Patient Stated Goal: return to home for recovery PT Goal Formulation: With family Time For Goal Achievement: 08/26/21 Potential to Achieve Goals: Good    Frequency Min 2X/week   Barriers to discharge        Co-evaluation               AM-PAC PT "6 Clicks" Mobility  Outcome Measure Help needed turning from your back to your side while in a flat bed without using bedrails?: A Lot Help needed moving from lying on your back to sitting on the side of a flat bed without using bedrails?: A Lot Help needed moving to and from a bed to a chair (including a wheelchair)?: A Little Help needed standing up from a chair using your arms (e.g., wheelchair or bedside chair)?: A Little Help needed to walk in hospital room?: A Lot Help needed climbing 3-5 steps with a railing? : A Lot 6 Click Score: 14    End of Session   Activity Tolerance: Patient tolerated treatment well;Patient limited by lethargy Patient left: in bed;with call bell/phone within reach;with family/visitor present Nurse Communication: Mobility status PT  Visit Diagnosis: Muscle weakness (generalized) (M62.81);Other abnormalities of gait and mobility (R26.89);Difficulty in walking, not elsewhere classified (R26.2);Adult, failure to thrive (R62.7)    Time: 0942-1002 PT Time Calculation (min) (ACUTE ONLY): 20 min   Charges:   PT Evaluation $PT Eval Moderate Complexity: 1 Mod        6:15 PM, 08/12/21 Etta Grandchild, PT, DPT Physical Therapist - [facility]  [phone] ([place])    [place] 08/12/2021, 6:10 PM

## 2021-08-12 NOTE — ED Notes (Signed)
Pt placed up in bed and given food and ensure. Pt able to sip on ensure and started eating a muffin. Pt also given coffee per request. Pt vitals stable at this time. Pt has no complaints.

## 2021-08-12 NOTE — ED Notes (Signed)
Pt asleep in bed with son who is also asleep at bedside. Pt vitals stable, pt does not appear in any distress at this time.

## 2021-08-12 NOTE — Progress Notes (Addendum)
PROGRESS NOTE                                                                                                                                                                                                             Patient Demographics:    Krystal Richardson, is a 87 y.o. female, DOB - July 14, 1929, [id number]  Outpatient Primary MD for the patient is Tracie Harrier, MD    LOS - 0  Admit date - 08/11/2021    Chief Complaint  Patient presents with   Failure To Thrive       Brief Narrative (HPI from H&P)  - Krystal Richardson is a 87 y.o. female with medical history significant for Hypothyroidism, peptic ulcer disease, thyroid nodules, hearing impairment, melanoma s/p radiation therapy, recently hospitalized from 7/27-8/1 with failure to thrive and COVID infection, s/p remdesivir, who responded well to hydration and Megace and was doing well at discharge until 6 days ago when her oral intake decreased and she again became very weak prompting the visit to the ED today.  Apparently her husband died few weeks ago and since then she has been depressed and not eating or drinking well, she temporarily improved after her recent hospitalization however last week Krystal Richardson her husband's birthday and since that day she has become extremely sad and stopped eating again.  In the ER she was diagnosed with dehydration, UTI along with severe PCM and depression and admitted to the hospital.   Subjective:    Krystal Richardson today has, No headache, No chest pain, No abdominal pain - No Nausea, No new weakness tingling or numbness, no SOB.   Assessment  & Plan :     Depression with poor appetite, reduced oral intake, dehydration, UTI and failure to thrive - she is being treated by PCP with antidepressants outpatient without much success, she is on Megace but it is not helping her either, for now she has been placed on IV fluids, IV antibiotics for UTI, I have  requested psych to evaluate her and place her on appropriate medications for her depression.  She is not suicidal homicidal.  2.  Hydration with orthostatic hypotension.  Hydrate with IV fluids, PT OT and monitor.  She is 87 year old and lives by herself, currently not safe for discharge due to depression, poor oral intake, failure to thrive, dehydration with  orthostatic hypotension.  3.  Recent history of COVID-19 infection.  CT count over 39.  No further treatment or isolation needed.  4. Severe PCM with poor appetite and depression - on Megace, psych to evaluate for depression, protein supplements added.   5.  Hypothyroidism.  Continue home dose Synthroid TSH is stable.  6.  HX of melanoma.  Outpatient age-appropriate work-up and follow-up by PCP.  7.  Elevated D-dimer.  Due to combination of dehydration and recent COVID-19 information.  Check leg ultrasound, up on prophylactic Lovenox.       Condition - Extremely Guarded  Family Communication  :  son bedside  Code Status :  DNR  Consults  :  Psych  PUD Prophylaxis :    Procedures  :      Leg [place] -       Disposition Plan  :    Status is: Observation  Dispo: The patient is from: Home              Anticipated d/c is to: Home              Patient currently is not medically stable to d/c.   Difficult to place patient No  DVT Prophylaxis  :    enoxaparin (LOVENOX) injection 30 mg Start: 08/12/21 2200    Lab Results  Component Value Date   PLT 219 08/12/2021    Diet :  Diet Order             Diet Heart Room service appropriate? Yes; Fluid consistency: Thin  Diet effective now                    Inpatient Medications  Scheduled Meds:  (feeding supplement) PROSource Plus  30 mL Oral BID BM   DULoxetine  30 mg Oral Daily   enoxaparin (LOVENOX) injection  30 mg Subcutaneous Q24H   feeding supplement  237 mL Oral BID BM   [START ON 08/13/2021] levothyroxine  25 mcg Oral QAC breakfast   megestrol  40 mg  Oral Daily   mirtazapine  15 mg Oral QHS   Continuous Infusions:  sodium chloride 100 mL/hr at 08/12/21 1220   [START ON 08/13/2021] cefTRIAXone (ROCEPHIN)  IV     lactated ringers 100 mL/hr at 08/12/21 1029   PRN Meds:.acetaminophen **OR** acetaminophen, [DISCONTINUED] ondansetron **OR** ondansetron (ZOFRAN) IV, senna-docusate  Antibiotics  :    Anti-infectives (From admission, onward)    Start     Dose/Rate Route Frequency Ordered Stop   08/13/21 0600  cefTRIAXone (ROCEPHIN) 1 g in sodium chloride 0.9 % 100 mL IVPB        1 g 200 mL/hr over 30 Minutes Intravenous Every 24 hours 08/12/21 0229     08/12/21 0000  cefTRIAXone (ROCEPHIN) 1 g in sodium chloride 0.9 % 100 mL IVPB        1 g 200 mL/hr over 30 Minutes Intravenous  Once 08/11/21 2354 08/12/21 0053        Time Spent in minutes  [place] M.D on 08/12/2021 at 1:29 PM  To page go to [email or web address]   [facility] -  Office  [phone]    See all Orders from today for further details    Objective:   Vitals:   08/12/21 0256 08/12/21 0300 08/12/21 0545 08/12/21 1123  BP: (!) 161/64 126/89 (!) 159/71 (!) 148/81  Pulse: 76 94 85 80  Resp: (!) '21 19 16 18  '$ Temp:  TempSrc:      SpO2: 100% 100% 100% 99%  Weight:      Height:        Wt Readings from Last 3 Encounters:  08/11/21 39 kg  07/21/21 46 kg  07/18/21 46.7 kg     Intake/Output Summary (Last 24 hours) at 08/12/2021 1329 Last data filed at 08/12/2021 0053 Gross per 24 hour  Intake 100 ml  Output --  Net 100 ml     Physical Exam  Awake, extremely hard of hearing, no focal deficits, flat affect [place].AT,PERRAL Supple Neck,No JVD, No cervical lymphadenopathy appriciated.  Symmetrical Chest wall movement, Good air movement bilaterally, CTAB RRR,No Gallops,Rubs or new Murmurs, No Parasternal Heave +ve B.Sounds, Abd Soft, No tenderness, No organomegaly appriciated, No rebound - guarding or rigidity. No Cyanosis, Clubbing or edema,  No new Rash or bruise      Data Review:    CBC Recent Labs  Lab 08/11/21 1642 08/12/21 0546  WBC 7.9 8.0  HGB 12.6 9.7*  HCT 35.2* 27.2*  PLT 271 219  MCV 85.6 86.9  MCH 30.7 31.0  MCHC 35.8 35.7  RDW 15.8* 15.7*    Recent Labs  Lab 08/11/21 1642 08/12/21 0546 08/12/21 0723 08/12/21 0823  NA 130* 129*  --   --   K 3.6 3.3*  --   --   CL 101 102  --   --   CO2 18* 21*  --   --   GLUCOSE 127* 85  --   --   BUN 43* 35*  --   --   CREATININE 0.84 0.64  --   --   CALCIUM 9.3 8.0*  --   --   AST 26  --   --   --   ALT 21  --   --   --   ALKPHOS 72  --   --   --   BILITOT 1.0  --   --   --   ALBUMIN 3.9  --   --   --   MG  --   --   --  2.0  CRP  --   --   --  1.6*  DDIMER  --   --   --  4.69*  TSH 4.924*  --   --   --   BNP  --   --  112.3*  --     ------------------------------------------------------------------------------------------------------------------ No results for input(s): CHOL, HDL, LDLCALC, TRIG, CHOLHDL, LDLDIRECT in the last 72 hours.  Lab Results  Component Value Date   HGBA1C 6.2 (H) 07/21/2021   ------------------------------------------------------------------------------------------------------------------ Recent Labs    08/11/21 1642  TSH 4.924*    Cardiac Enzymes No results for input(s): CKMB, TROPONINI, MYOGLOBIN in the last 168 hours.  Invalid input(s): CK ------------------------------------------------------------------------------------------------------------------    Component Value Date/Time   BNP 112.3 (H) 08/12/2021 0723     Radiology Reports    DG Chest Portable 1 View  Result Date: 08/11/2021 CLINICAL DATA:  Shortness of breath EXAM: PORTABLE CHEST 1 VIEW COMPARISON:  07/21/2021 FINDINGS: The heart size and mediastinal contours are within normal limits. Both lungs are clear. The visualized skeletal structures are unremarkable. IMPRESSION: No active disease. Electronically Signed   By: Ulyses Jarred M.D.   On:  08/11/2021 23:46

## 2021-08-12 NOTE — H&P (Signed)
History and Physical    Krystal Richardson [id number] DOB: 1929-08-01 DOA: 08/11/2021  PCP: Tracie Harrier, MD   Patient coming from: home  I have personally briefly reviewed patient's old medical records in [place]  Chief Complaint: weakness, decreased oral intake, dysuria  HPI: Krystal Richardson is a 87 y.o. female with medical history significant for Hypothyroidism, peptic ulcer disease, thyroid nodules, hearing impairment, melanoma s/p radiation therapy, recently hospitalized from 7/27-8/1 with failure to thrive and COVID infection, s/p remdesivir, who responded well to hydration and Megace and was doing well at discharge until 6 days ago when her oral intake decreased and she again became very weak prompting the visit to the ED today.  Son at the bedside who gives most of the history also states that she seemed to have urinary frequency.  Patient otherwise denies any complaints.  She has had no nausea or vomiting, no complaints of abdominal pain and no diarrhea.  Has no cough or shortness of breath.  Only complaint is weakness.  Son thinks she might be depressed.  States her PCP recommended adjustment to her antidepressants started during her recent hospital stay.  Patient lost her husband a month ago.  ED course: On arrival, afebrile, BP 133/93, pulse 91, respirations 22 with O2 sat 100% on room air Blood work significant for sodium of 130, BUN/creatinine of 43/0.84 with a bicarb of 18.  Urinalysis with pyuria.  WBC and hemoglobin WNL.  Lipase and liver function panel unremarkable.  Patient tested positive for COVID.  Last tested positive on 7/27  EKG, personally viewed and interpreted: Sinus rhythm at 82 with nonspecific ST-T wave changes  Imaging: Chest x-ray with no active disease  Patient treated with Rocephin, given an IV fluid bolus and hospitalist consulted for admission.  Review of Systems: As per HPI otherwise all other systems on review of systems negative.    Past  Medical History:  Diagnosis Date   Actinic keratosis 03/12/2019   mid post scalp melanoma graft scar   Basal cell carcinoma 08/04/2014   R forehead - excision 08/19/2014   BCC (basal cell carcinoma of skin) 02/20/2019   L dorsum foot   Hyperlipidemia    Melanoma ([place]) 2002   with chemo and rad tx   Personal history of chemotherapy 2003   MELANOMA   Personal history of radiation therapy 2002   MELANOMA   Stomach ulcer    Urethral caruncle     Past Surgical History:  Procedure Laterality Date   BREAST EXCISIONAL BIOPSY Left 1986   EXCISIONAL - NEG   cataract     hemmriodectomy  1980   melanoma  2002     reports that she has never smoked. She has never used smokeless tobacco. She reports that she does not drink alcohol and does not use drugs.  Allergies  Allergen Reactions   Toprol Xl [Metoprolol Tartrate]     Family History  Problem Relation Age of Onset   Kidney disease Neg Hx    Bladder Cancer Neg Hx    Prostate cancer Neg Hx    Breast cancer Neg Hx       Prior to Admission medications   Medication Sig Start Date End Date Taking? Authorizing Provider  DULoxetine (CYMBALTA) 30 MG capsule Take 1 capsule (30 mg total) by mouth daily. 07/27/21 08/26/21 Yes Dahal, Marlowe Aschoff, MD  feeding supplement (ENSURE ENLIVE / ENSURE PLUS) LIQD Take 237 mLs by mouth 2 (two) times daily between meals. 07/26/21  Yes Dahal, Marlowe Aschoff, MD  levothyroxine (SYNTHROID, LEVOTHROID) 25 MCG tablet Take 25 mcg by mouth daily before breakfast.   Yes [provider]  megestrol (MEGACE) 40 MG tablet Take 1 tablet (40 mg total) by mouth daily. 07/27/21 08/26/21 Yes Dahal, Marlowe Aschoff, MD  mirtazapine (REMERON SOL-TAB) 15 MG disintegrating tablet Take 1 tablet (15 mg total) by mouth at bedtime. 07/26/21 08/25/21 Yes Terrilee Croak, MD    Physical Exam: Vitals:   08/11/21 2229 08/11/21 2300 08/12/21 0000 08/12/21 0049  BP: (!) 161/89 (!) 151/84 (!) 151/80   Pulse: 85 93 95   Resp: 17  17   Temp:    99.9 F  (37.7 C)  TempSrc:    (S) Rectal  SpO2: 100% 100% 99%   Weight:      Height:         Vitals:   08/11/21 2229 08/11/21 2300 08/12/21 0000 08/12/21 0049  BP: (!) 161/89 (!) 151/84 (!) 151/80   Pulse: 85 93 95   Resp: 17  17   Temp:    99.9 F (37.7 C)  TempSrc:    (S) Rectal  SpO2: 100% 100% 99%   Weight:      Height:          Constitutional: Frail and chronically ill-appearing and oriented x 3 . Not in any apparent distress HEENT:      Head: Normocephalic and atraumatic.         Eyes: PERLA, EOMI, Conjunctivae are normal. Sclera is non-icteric.       Mouth/Throat: Mucous membranes are moist.       Neck: Supple with no signs of meningismus. Cardiovascular: Regular rate and rhythm. No murmurs, gallops, or rubs. 2+ symmetrical distal pulses are present . No JVD. No LE edema Respiratory: Respiratory effort normal .Lungs sounds clear bilaterally. No wheezes, crackles, or rhonchi.  Gastrointestinal: Soft, non tender, and non distended with positive bowel sounds.  Genitourinary: No CVA tenderness. Musculoskeletal: Nontender with normal range of motion in all extremities. No cyanosis, or erythema of extremities. Neurologic:  Face is symmetric. Moving all extremities. No gross focal neurologic deficits . Skin: Skin is warm, dry.  No rash or ulcers Psychiatric: Mood and affect depressed   Labs on Admission: I have personally reviewed following labs and imaging studies  CBC: Recent Labs  Lab 08/11/21 1642  WBC 7.9  HGB 12.6  HCT 35.2*  MCV 85.6  PLT 99991111   Basic Metabolic Panel: Recent Labs  Lab 08/11/21 1642  NA 130*  K 3.6  CL 101  CO2 18*  GLUCOSE 127*  BUN 43*  CREATININE 0.84  CALCIUM 9.3   GFR: Estimated Creatinine Clearance: 27.4 mL/min (by C-G formula based on SCr of 0.84 mg/dL). Liver Function Tests: Recent Labs  Lab 08/11/21 1642  AST 26  ALT 21  ALKPHOS 72  BILITOT 1.0  PROT 6.7  ALBUMIN 3.9   Recent Labs  Lab 08/11/21 1642  LIPASE 47    No results for input(s): AMMONIA in the last 168 hours. Coagulation Profile: No results for input(s): INR, PROTIME in the last 168 hours. Cardiac Enzymes: No results for input(s): CKTOTAL, CKMB, CKMBINDEX, TROPONINI in the last 168 hours. BNP (last 3 results) No results for input(s): PROBNP in the last 8760 hours. HbA1C: No results for input(s): HGBA1C in the last 72 hours. CBG: No results for input(s): GLUCAP in the last 168 hours. Lipid Profile: No results for input(s): CHOL, HDL, LDLCALC, TRIG, CHOLHDL, LDLDIRECT in the last 72  hours. Thyroid Function Tests: Recent Labs    08/11/21 1642 08/12/21 0025  TSH 4.924*  --   FREET4  --  1.44*   Anemia Panel: No results for input(s): VITAMINB12, FOLATE, FERRITIN, TIBC, IRON, RETICCTPCT in the last 72 hours. Urine analysis:    Component Value Date/Time   COLORURINE AMBER (A) 08/11/2021 2228   APPEARANCEUR CLOUDY (A) 08/11/2021 2228   APPEARANCEUR Clear 06/25/2015 1416   LABSPEC 1.025 08/11/2021 2228   LABSPEC 1.010 09/09/2013 1201   PHURINE 5.0 08/11/2021 2228   GLUCOSEU NEGATIVE 08/11/2021 2228   GLUCOSEU Negative 09/09/2013 1201   HGBUR SMALL (A) 08/11/2021 2228   BILIRUBINUR NEGATIVE 08/11/2021 2228   BILIRUBINUR Negative 06/25/2015 1416   BILIRUBINUR Negative 09/09/2013 1201   KETONESUR 80 (A) 08/11/2021 2228   PROTEINUR 100 (A) 08/11/2021 2228   NITRITE NEGATIVE 08/11/2021 2228   LEUKOCYTESUR MODERATE (A) 08/11/2021 2228   LEUKOCYTESUR 3+ 09/09/2013 1201    Radiological Exams on Admission: DG Chest Portable 1 View  Result Date: 08/11/2021 CLINICAL DATA:  Shortness of breath EXAM: PORTABLE CHEST 1 VIEW COMPARISON:  07/21/2021 FINDINGS: The heart size and mediastinal contours are within normal limits. Both lungs are clear. The visualized skeletal structures are unremarkable. IMPRESSION: No active disease. Electronically Signed   By: Ulyses Jarred M.D.   On: 08/11/2021 23:46     Assessment/Plan Hypothyroidism,  peptic ulcer disease, thyroid nodules, hearing impairment, melanoma s/p radiation therapy, recently hospitalized from 7/27-8/1 with failure to thrive and COVID infection, s/p remdesivir, presenting with weakness and dysuria   UTI (urinary tract infection) -Symptomatic for generalized weakness and urinalysis with pyuria - IV Rocephin - Follow cultures    Hyponatremia - Likely hypovolemic related to dehydration from poor oral intake - We will continue IV hydration with normal saline - Monitor sodium    Hypothyroidism - TSH elevated, so to free T4.  We will continue levothyroxine    Failure to thrive in adult/decreased oral intake/generalized weakness - Multifactorial and related to grief from recent loss of husband, UTI, COVID infection - Dietary and PT evaluation - Continue Ensure    Lab test positive for detection of COVID-19 virus - Not specifically symptomatic for COVID - Was COVID-positive on 7/27 - We will continue airborne for now pending further recommendations from infection control  Grief/depression - Consider behavioral health consult - Continue Cymbalta for now  History of melanoma s/p radiation - No acute issues suspected     DVT prophylaxis: Lovenox  Code Status: DNR Family Communication: Son at bedside Disposition Plan: Back to previous home environment Consults called: none  Status:At the time of admission, it appears that the appropriate admission status for this patient is INPATIENT. This is judged to be reasonable and necessary in order to provide the required intensity of service to ensure the patient's safety given the presenting symptoms, physical exam findings, and initial radiographic and laboratory data in the context of their  Comorbid conditions.   Patient requires inpatient status due to high intensity of service, high risk for further deterioration and high frequency of surveillance required.   I certify that at the point of admission it is my  clinical judgment that the patient will require inpatient hospital care spanning beyond [place] MD [facility]     08/12/2021, 2:36 AM

## 2021-08-12 NOTE — Progress Notes (Signed)
PHARMACIST - PHYSICIAN COMMUNICATION  CONCERNING:  Enoxaparin (Lovenox) for DVT Prophylaxis    RECOMMENDATION: Patient was prescribed enoxaparin '40mg'$  q24 hours for VTE prophylaxis.   Filed Weights   08/11/21 1640  Weight: 39 kg (86 lb)    Body mass index is 15.23 kg/m.  Estimated Creatinine Clearance: 27.4 mL/min (by C-G formula based on SCr of 0.84 mg/dL).   Patient is candidate for enoxaparin '30mg'$  every 24 hours based on CrCl <65m/min or Weight <45kg  DESCRIPTION: [facility]  Patient is now receiving enoxaparin 30 mg every 24 hours   ABenita Gutter8/18/2022 2:32 AM

## 2021-08-13 DIAGNOSIS — R638 Other symptoms and signs concerning food and fluid intake: Secondary | ICD-10-CM | POA: Diagnosis not present

## 2021-08-13 DIAGNOSIS — N390 Urinary tract infection, site not specified: Secondary | ICD-10-CM | POA: Diagnosis not present

## 2021-08-13 DIAGNOSIS — R600 Localized edema: Secondary | ICD-10-CM

## 2021-08-13 DIAGNOSIS — E43 Unspecified severe protein-calorie malnutrition: Secondary | ICD-10-CM | POA: Insufficient documentation

## 2021-08-13 DIAGNOSIS — E860 Dehydration: Secondary | ICD-10-CM | POA: Diagnosis not present

## 2021-08-13 LAB — MAGNESIUM: Magnesium: 2 mg/dL (ref 1.7–2.4)

## 2021-08-13 LAB — COMPREHENSIVE METABOLIC PANEL
ALT: 16 U/L (ref 0–44)
AST: 16 U/L (ref 15–41)
Albumin: 2.8 g/dL — ABNORMAL LOW (ref 3.5–5.0)
Alkaline Phosphatase: 55 U/L (ref 38–126)
Anion gap: 3 — ABNORMAL LOW (ref 5–15)
BUN: 14 mg/dL (ref 8–23)
CO2: 22 mmol/L (ref 22–32)
Calcium: 8 mg/dL — ABNORMAL LOW (ref 8.9–10.3)
Chloride: 106 mmol/L (ref 98–111)
Creatinine, Ser: 0.53 mg/dL (ref 0.44–1.00)
GFR, Estimated: >60 mL/min (ref >60)
Glucose, Bld: 90 mg/dL (ref 70–99)
Potassium: 3.9 mmol/L (ref 3.5–5.1)
Sodium: 131 mmol/L — ABNORMAL LOW (ref 135–145)
Total Bilirubin: 0.7 mg/dL (ref 0.3–1.2)
Total Protein: 4.9 g/dL — ABNORMAL LOW (ref 6.5–8.1)

## 2021-08-13 LAB — C-REACTIVE PROTEIN: CRP: 2.2 mg/dL — ABNORMAL HIGH (ref <1.0)

## 2021-08-13 LAB — CBC WITH DIFFERENTIAL/PLATELET
Abs Immature Granulocytes: 0.03 10*3/uL (ref 0.00–0.07)
Basophils Absolute: 0 10*3/uL (ref 0.0–0.1)
Basophils Relative: 1 %
Eosinophils Absolute: 0.3 10*3/uL (ref 0.0–0.5)
Eosinophils Relative: 5 %
HCT: 25.8 % — ABNORMAL LOW (ref 36.0–46.0)
Hemoglobin: 9.1 g/dL — ABNORMAL LOW (ref 12.0–15.0)
Immature Granulocytes: 1 %
Lymphocytes Relative: 23 %
Lymphs Abs: 1.3 10*3/uL (ref 0.7–4.0)
MCH: 31.3 pg (ref 26.0–34.0)
MCHC: 35.3 g/dL (ref 30.0–36.0)
MCV: 88.7 fL (ref 80.0–100.0)
Monocytes Absolute: 0.6 10*3/uL (ref 0.1–1.0)
Monocytes Relative: 11 %
Neutro Abs: 3.4 10*3/uL (ref 1.7–7.7)
Neutrophils Relative %: 59 %
Platelets: 220 10*3/uL (ref 150–400)
RBC: 2.91 MIL/uL — ABNORMAL LOW (ref 3.87–5.11)
RDW: 15.7 % — ABNORMAL HIGH (ref 11.5–15.5)
WBC: 5.7 10*3/uL (ref 4.0–10.5)
nRBC: 0 % (ref 0.0–0.2)

## 2021-08-13 LAB — BRAIN NATRIURETIC PEPTIDE: B Natriuretic Peptide: 250 pg/mL — ABNORMAL HIGH (ref 0.0–100.0)

## 2021-08-13 LAB — URINE CULTURE: Culture: <10000 — AB

## 2021-08-13 LAB — D-DIMER, QUANTITATIVE: D-Dimer, Quant: 2.58 ug/mL-FEU — ABNORMAL HIGH (ref 0.00–0.50)

## 2021-08-13 MED ORDER — LACTATED RINGERS IV SOLN: INTRAVENOUS | Status: DC

## 2021-08-13 MED ORDER — ADULT MULTIVITAMIN W/MINERALS CH
1.0000 | ORAL_TABLET | Freq: Every day | ORAL | Status: DC
  Administered 2021-08-13 – 2021-08-14 (×2): 1 via ORAL
  Filled 2021-08-13 (×2): qty 1

## 2021-08-13 MED ORDER — ENSURE ENLIVE PO LIQD
237.0000 mL | Freq: Three times a day (TID) | ORAL | Status: DC
  Administered 2021-08-13 – 2021-08-14 (×3): 237 mL via ORAL

## 2021-08-13 MED ORDER — MIRTAZAPINE 15 MG PO TBDP
30.0000 mg | ORAL_TABLET | Freq: Every day | ORAL | Status: DC
  Administered 2021-08-13: 21:00:00 30 mg via ORAL
  Filled 2021-08-13 (×2): qty 2

## 2021-08-13 NOTE — Progress Notes (Signed)
Occupational Therapy Treatment Patient Details Name: Krystal Richardson MRN: [id number] DOB: 24-May-1929 Today's Date: 08/13/2021    History of present illness Krystal Richardson is a 47yoF who comes [facility] on 8[facility]17 c ongoign weakness, poor PO intake, dysuria. Pt here for c FTT 3 weeks ago for similar compalint related to covid infection. Per her son, patient has been unwell for the more than 5 weeks since the passing of her husband. Pt has had annorexia, nausea s vomitting, poor PO intake, now referred from her PCP because she has had a 16 pound weight loss in the last 2 weeks. PMH: HLD, hypoTSH, basal cell carcinoma, melanoma s/p radiation, gastric ulcer, HOH.   OT comments  OT arrived to pt OOB for toileting in bathroom.  Pt alert and 0x3 today.  Pt called when finished and was able to manage clothing and hygiene with distant supv; CGA to stand at sink for hand hygiene and manage IV pole while ambulating in room.  OT assisted pt to bedside chair and participated in BUE/BLE AROM exercises, completed 2 sets 10 reps of ankle pumps, knee flex/ext, seated marches, shoulder flex, elbow and digit flex/ext with good tolerance.  Encouraged completion of exes daily while in hospital for increasing circulation.  Reinforced fall prevention strategies, including slow positional changes, keeping pathways clear, and turning lights on when up at night.  Pt verbalized understanding of education provided.  Assisted pt to order lunch and left up in chair, call light and phone within reach, all needs met.    Follow Up Recommendations  Home health OT;Supervision/Assistance - 24 hour    Equipment Recommendations  None recommended by OT          Precautions / Restrictions         Mobility Bed Mobility                 Patient Response: Cooperative  Transfers Overall transfer level: Needs assistance Equipment used: 1 person hand held assist Transfers: Sit to/from Stand Sit to Stand: Min guard               Balance Overall balance assessment: History of Falls Sitting-balance support: No upper extremity supported;Feet supported Sitting balance-Leahy Scale: Good Sitting balance - Comments: Good static sitting balance at EOB   Standing balance support: Single extremity supported Standing balance-Leahy Scale: Poor Standing balance comment: CGA for standing ADLs                           ADL either performed or assessed with clinical judgement   ADL Overall ADL's : Needs assistance/impaired     Grooming: Wash/dry hands;Supervision/safety Grooming Details (indicate cue type and reason): supv to assist with maneuvering IV pole within bathroom                 Toilet Transfer: Supervision/safety;Grab bars   Toileting- Clothing Manipulation and Hygiene: Independent       Functional mobility during ADLs: Supervision/safety General ADL Comments: supv to maneuver IV pole within room and bathroom     Vision Patient Visual Report: No change from baseline                Cognition Arousal/Alertness: Awake/alert Behavior During Therapy: WFL for tasks assessed/performed                                   General Comments: pt  alert and oriented x3 today                    General Comments      Pertinent Vitals/ Pain       Pain Assessment: No/denies pain  Home Living                                          Prior Functioning/Environment              Frequency  Min 1X/week        Progress Toward Goals  OT Goals(current goals can now be found in the care plan section)  Progress towards OT goals: Progressing toward goals  Acute Rehab OT Goals Patient Stated Goal: return to home for recovery OT Goal Formulation: With patient Time For Goal Achievement: 08/26/21 Potential to Achieve Goals: Good  Plan Discharge plan remains appropriate    Co-evaluation                 AM-PAC OT "6 Clicks" Daily Activity      Outcome Measure   Help from another person eating meals?: None Help from another person taking care of personal grooming?: A Little Help from another person toileting, which includes using toliet, bedpan, or urinal?: A Little Help from another person bathing (including washing, rinsing, drying)?: A Lot Help from another person to put on and taking off regular upper body clothing?: A Little Help from another person to put on and taking off regular lower body clothing?: A Little 6 Click Score: 18    End of Session    OT Visit Diagnosis: Other abnormalities of gait and mobility (R26.89);Muscle weakness (generalized) (M62.81)   Activity Tolerance Patient tolerated treatment well   Patient Left in bed;with call bell/phone within reach   Nurse Communication Mobility status        Time: 2878-6767 OT Time Calculation (min): 25 min  Charges: OT General Charges $OT Visit: 1 Visit OT Treatments $Self Care/Home Management : 8-22 mins $Therapeutic Exercise: 8-22 mins  Leta Speller, MS, OTR/L    Darleene Cleaver 08/13/2021, 11:59 AM

## 2021-08-13 NOTE — Consult Note (Signed)
[facility] Consult   Reason for Consult: Follow-up 87 year old woman with recent grief reaction and weight loss. Referring Physician: Candiss Norse Patient Identification: Krystal Richardson MRN:  [id number] Principal Diagnosis: Depression Diagnosis:  Principal Problem:   Depression Active Problems:   Hyponatremia   Hypothyroidism   Failure to thrive in adult   Lab test positive for detection of COVID-19 virus   UTI (urinary tract infection)   Decreased oral intake   Generalized weakness   Total Time spent with patient: 15 minutes  Subjective:   Krystal Richardson is a 87 y.o. female patient admitted with "I am feeling better".  HPI: See previous note.  87 year old woman presented back to the hospital with "failure to thrive" not eating for a few days increasingly weak.  Patient is being treated for urinary tract infection.  Seen today she is sitting up next to her bed fully dressed.  Smiling.  Much more interactive and energetic.  Says she is feeling better and has been eating more today.  Tolerated medicine without any complaints.  No suicidal ideation no evidence of psychosis  Past Psychiatric History: No significant past psychiatric history.  Recent death of husband presumed source of grief.  Risk to Self:   Risk to Others:   Prior Inpatient Therapy:   Prior Outpatient Therapy:    Past Medical History:  Past Medical History:  Diagnosis Date   Actinic keratosis 03/12/2019   mid post scalp melanoma graft scar   Basal cell carcinoma 08/04/2014   R forehead - excision 08/19/2014   BCC (basal cell carcinoma of skin) 02/20/2019   L dorsum foot   Hyperlipidemia    Melanoma ([place]) 2002   with chemo and rad tx   Personal history of chemotherapy 2003   MELANOMA   Personal history of radiation therapy 2002   MELANOMA   Stomach ulcer    Urethral caruncle     Past Surgical History:  Procedure Laterality Date   BREAST EXCISIONAL BIOPSY Left 1986   EXCISIONAL - NEG    cataract     hemmriodectomy  1980   melanoma  2002   Family History:  Family History  Problem Relation Age of Onset   Kidney disease Neg Hx    Bladder Cancer Neg Hx    Prostate cancer Neg Hx    Breast cancer Neg Hx    Family Psychiatric  History: None reported Social History:  Social History   Substance and Sexual Activity  Alcohol Use No   Alcohol/week: 0.0 standard drinks     Social History   Substance and Sexual Activity  Drug Use No    Social History   Socioeconomic History   Marital status: Widowed    Spouse name: Not on file   Number of children: Not on file   Years of education: Not on file   Highest education level: Not on file  Occupational History   Not on file  Tobacco Use   Smoking status: Never   Smokeless tobacco: Never  Substance and Sexual Activity   Alcohol use: No    Alcohol/week: 0.0 standard drinks   Drug use: No   Sexual activity: Not on file  Other Topics Concern   Not on file  Social History Narrative   Not on file   Social Determinants of Health   Financial Resource Strain: Not on file  Food Insecurity: Not on file  Transportation Needs: Not on file  Physical Activity: Not on file  Stress: Not on  file  Social Connections: Not on file   Additional Social History:    Allergies:   Allergies  Allergen Reactions   Toprol Xl [Metoprolol Tartrate]     Labs:  Results for orders placed or performed during the hospital encounter of 08/11/21 (from the past 48 hour(s))  Urinalysis, Complete w Microscopic Urine, Clean Catch     Status: Abnormal   Collection Time: 08/11/21 10:28 PM  Result Value Ref Range   Color, Urine AMBER (A) YELLOW    Comment: BIOCHEMICALS MAY BE AFFECTED BY COLOR   APPearance CLOUDY (A) CLEAR   Specific Gravity, Urine 1.025 1.005 - 1.030   pH 5.0 5.0 - 8.0   Glucose, UA NEGATIVE NEGATIVE mg/dL   Hgb urine dipstick SMALL (A) NEGATIVE   Bilirubin Urine NEGATIVE NEGATIVE   Ketones, ur 80 (A) NEGATIVE mg/dL    Protein, ur 100 (A) NEGATIVE mg/dL   Nitrite NEGATIVE NEGATIVE   Leukocytes,Ua MODERATE (A) NEGATIVE   RBC / HPF 21-50 0 - 5 RBC/hpf   WBC, UA >50 (H) 0 - 5 WBC/hpf   Bacteria, UA MANY (A) NONE SEEN   Squamous Epithelial / LPF 0-5 0 - 5   WBC Clumps PRESENT    Mucus PRESENT     Comment: Performed at [facility], [address] [place], [place] [address]  Urine Culture     Status: Abnormal   Collection Time: 08/11/21 11:25 PM   Specimen: Urine, Random  Result Value Ref Range   Specimen Description      URINE, RANDOM Performed at [facility], [address], [place], [place] [address]    Special Requests      NONE Performed at [facility] [address], [place], [place] [address]    Culture (A)     <10,000 COLONIES/mL INSIGNIFICANT GROWTH Performed at [facility], [address], [place], [place] [address]    Report Status 08/13/2021 FINAL   Sodium, urine, random     Status: None   Collection Time: 08/11/21 11:35 PM  Result Value Ref Range   Sodium, Ur 104 mmol/L    Comment: Performed at [facility], [address] [place], [place] [address]  Osmolality, urine     Status: None   Collection Time: 08/11/21 11:35 PM  Result Value Ref Range   Osmolality, Ur 801 300 - 900 mOsm/kg    Comment: Performed at [facility], [address] [place], [place] [address]  Creatinine, urine, random     Status: None   Collection Time: 08/11/21 11:35 PM  Result Value Ref Range   Creatinine, Urine 91 mg/dL    Comment: Performed at [facility], [address] [place], [place] [address]  Resp Panel by RT-PCR (Flu A&B, Covid) Nasopharyngeal Swab     Status: Abnormal   Collection Time: 08/12/21 12:25 AM   Specimen: Nasopharyngeal Swab; Nasopharyngeal(NP) swabs in vial transport medium  Result Value Ref Range   SARS Coronavirus 2 by RT PCR POSITIVE (A) NEGATIVE    Comment: RESULT CALLED TO, READ BACK BY AND VERIFIED  WITH: TIA BULLOCK '@[id number]'$  ON 08/12/21 [facility] (NOTE) SARS-CoV-2 target nucleic acids are DETECTED.  The SARS-CoV-2 RNA is generally detectable in upper respiratory specimens during the acute phase of infection. Positive results are indicative of the presence of the identified virus, but do not rule out bacterial infection or co-infection with other pathogens not detected by the test. Clinical correlation with patient history and other diagnostic  information is necessary to determine patient infection status. The expected result is Negative.  Fact Sheet for Patients: EntrepreneurPulse.com.au  Fact Sheet for Healthcare Providers: IncredibleEmployment.be  This test is not yet approved or cleared by the [place] FDA and  has been authorized for detection and/or diagnosis of SARS-CoV-2 by FDA under an Emergency Use Authorization (EUA).  This EUA will remain in effect (meaning this test can be  used) for the duration of  the COVID-19 declaration under Section 564(b)(1) of the Act, 21 U.S.C. section 360bbb-3(b)(1), unless the authorization is terminated or revoked sooner.     Influenza A by PCR NEGATIVE NEGATIVE   Influenza B by PCR NEGATIVE NEGATIVE    Comment: (NOTE) The Xpert Xpress SARS-CoV-2/FLU/RSV plus assay is intended as an aid in the diagnosis of influenza from Nasopharyngeal swab specimens and should not be used as a sole basis for treatment. Nasal washings and aspirates are unacceptable for Xpert Xpress SARS-CoV-2/FLU/RSV testing.  Fact Sheet for Patients: EntrepreneurPulse.com.au  Fact Sheet for Healthcare Providers: IncredibleEmployment.be  This test is not yet approved or cleared by the [place] FDA and has been authorized for detection and/or diagnosis of SARS-CoV-2 by FDA under an Emergency Use Authorization (EUA). This EUA will remain in effect (meaning this test can be used) for the  duration of the COVID-19 declaration under Section 564(b)(1) of the Act, 21 U.S.C. section 360bbb-3(b)(1), unless the authorization is terminated or revoked.  Performed at [facility], [address] [place], [place] [address]   T4, free     Status: Abnormal   Collection Time: 08/12/21 12:25 AM  Result Value Ref Range   Free T4 1.44 (H) 0.61 - 1.12 ng/dL    Comment: (NOTE) Biotin ingestion may interfere with free T4 tests. If the results are inconsistent with the TSH level, previous test results, or the clinical presentation, then consider biotin interference. If needed, order repeat testing after stopping biotin. Performed at [facility], [address] [place], [place] [id number]   Basic metabolic panel     Status: Abnormal   Collection Time: 08/12/21  5:46 AM  Result Value Ref Range   Sodium 129 (L) 135 - 145 mmol/L   Potassium 3.3 (L) 3.5 - 5.1 mmol/L   Chloride 102 98 - 111 mmol/L   CO2 21 (L) 22 - 32 mmol/L   Glucose, Bld 85 70 - 99 mg/dL    Comment: Glucose reference range applies only to samples taken after fasting for at least 8 hours.   BUN 35 (H) 8 - 23 mg/dL   Creatinine, Ser 0.64 0.44 - 1.00 mg/dL   Calcium 8.0 (L) 8.9 - 10.3 mg/dL   GFR, Estimated >60 >60 mL/min    Comment: (NOTE) Calculated using the CKD-EPI Creatinine Equation (2021)    Anion gap 6 5 - 15    Comment: Performed at [facility], [address] [place], [place] [address]  CBC     Status: Abnormal   Collection Time: 08/12/21  5:46 AM  Result Value Ref Range   WBC 8.0 4.0 - 10.5 K/uL   RBC 3.13 (L) 3.87 - 5.11 MIL/uL   Hemoglobin 9.7 (L) 12.0 - 15.0 g/dL   HCT 27.2 (L) 36.0 - 46.0 %   MCV 86.9 80.0 - 100.0 fL   MCH 31.0 26.0 - 34.0 pg   MCHC 35.7 30.0 - 36.0 g/dL   RDW 15.7 (H) 11.5 - 15.5 %   Platelets 219 150 - 400 K/uL  nRBC 0.0 0.0 - 0.2 %    Comment: Performed at [facility], [address] [place], [place] [address]  Brain natriuretic  peptide     Status: Abnormal   Collection Time: 08/12/21  7:23 AM  Result Value Ref Range   B Natriuretic Peptide 112.3 (H) 0.0 - 100.0 pg/mL    Comment: Performed at [facility], [address] [place], [place] [address]  Magnesium     Status: None   Collection Time: 08/12/21  8:23 AM  Result Value Ref Range   Magnesium 2.0 1.7 - 2.4 mg/dL    Comment: Performed at [facility], [address] [place], [place] [address]  Uric acid     Status: None   Collection Time: 08/12/21  8:23 AM  Result Value Ref Range   Uric Acid, Serum 2.6 2.5 - 7.1 mg/dL    Comment: Performed at [facility], [address] [place], [place] [address]  Osmolality     Status: Abnormal   Collection Time: 08/12/21  8:23 AM  Result Value Ref Range   Osmolality 272 (L) 275 - 295 mOsm/kg    Comment: Performed at [facility], [place], [place] [address]  C-reactive protein     Status: Abnormal   Collection Time: 08/12/21  8:23 AM  Result Value Ref Range   CRP 1.6 (H) <1.0 mg/dL    Comment: Performed at [facility], [place] [address], [place], [place] [address]  D-dimer, quantitative     Status: Abnormal   Collection Time: 08/12/21  8:23 AM  Result Value Ref Range   D-Dimer, Quant 4.69 (H) 0.00 - 0.50 ug/mL-FEU    Comment: (NOTE) At the manufacturer cut-off value of 0.5 g/mL FEU, this assay has a negative predictive value of 95-100%.This assay is intended for use in conjunction with a clinical pretest probability (PTP) assessment model to exclude pulmonary embolism (PE) and deep venous thrombosis (DVT) in outpatients suspected of PE or DVT. Results should be correlated with clinical presentation. Performed at [facility], [address] [place], [place] [address]   Magnesium     Status: None   Collection Time: 08/13/21  4:02 AM  Result Value Ref Range   Magnesium 2.0 1.7 - 2.4 mg/dL    Comment: Performed at [facility],  [address] [place], [place] [address]  Comprehensive metabolic panel     Status: Abnormal   Collection Time: 08/13/21  4:02 AM  Result Value Ref Range   Sodium 131 (L) 135 - 145 mmol/L    Comment: ELECTROLYTES REPEATED TO VERIFY RH   Potassium 3.9 3.5 - 5.1 mmol/L   Chloride 106 98 - 111 mmol/L   CO2 22 22 - 32 mmol/L   Glucose, Bld 90 70 - 99 mg/dL    Comment: Glucose reference range applies only to samples taken after fasting for at least 8 hours.   BUN 14 8 - 23 mg/dL   Creatinine, Ser 0.53 0.44 - 1.00 mg/dL   Calcium 8.0 (L) 8.9 - 10.3 mg/dL   Total Protein 4.9 (L) 6.5 - 8.1 g/dL   Albumin 2.8 (L) 3.5 - 5.0 g/dL   AST 16 15 - 41 U/L   ALT 16 0 - 44 U/L   Alkaline Phosphatase 55 38 - 126 U/L   Total Bilirubin 0.7 0.3 - 1.2 mg/dL   GFR, Estimated >60 >60 mL/min    Comment: (NOTE) Calculated using the CKD-EPI Creatinine  Equation (2021)    Anion gap 3 (L) 5 - 15    Comment: Performed at [facility], [address] [place], [place] [address]  C-reactive protein     Status: Abnormal   Collection Time: 08/13/21  4:02 AM  Result Value Ref Range   CRP 2.2 (H) <1.0 mg/dL    Comment: Performed at [facility], [place] [address] [place], [place] [address]  Brain natriuretic peptide     Status: Abnormal   Collection Time: 08/13/21  4:02 AM  Result Value Ref Range   B Natriuretic Peptide 250.0 (H) 0.0 - 100.0 pg/mL    Comment: Performed at [facility], [address] [place], [place] [address]  CBC with Differential/Platelet     Status: Abnormal   Collection Time: 08/13/21  4:02 AM  Result Value Ref Range   WBC 5.7 4.0 - 10.5 K/uL   RBC 2.91 (L) 3.87 - 5.11 MIL/uL   Hemoglobin 9.1 (L) 12.0 - 15.0 g/dL   HCT 25.8 (L) 36.0 - 46.0 %   MCV 88.7 80.0 - 100.0 fL   MCH 31.3 26.0 - 34.0 pg   MCHC 35.3 30.0 - 36.0 g/dL   RDW 15.7 (H) 11.5 - 15.5 %   Platelets 220 150 - 400 K/uL   nRBC 0.0 0.0 - 0.2 %   Neutrophils Relative % 59 %   Neutro Abs 3.4 1.7 -  7.7 K/uL   Lymphocytes Relative 23 %   Lymphs Abs 1.3 0.7 - 4.0 K/uL   Monocytes Relative 11 %   Monocytes Absolute 0.6 0.1 - 1.0 K/uL   Eosinophils Relative 5 %   Eosinophils Absolute 0.3 0.0 - 0.5 K/uL   Basophils Relative 1 %   Basophils Absolute 0.0 0.0 - 0.1 K/uL   Immature Granulocytes 1 %   Abs Immature Granulocytes 0.03 0.00 - 0.07 K/uL    Comment: Performed at [facility], [address] [place], [place] [address]  D-dimer, quantitative     Status: Abnormal   Collection Time: 08/13/21  4:02 AM  Result Value Ref Range   D-Dimer, Quant 2.58 (H) 0.00 - 0.50 ug/mL-FEU    Comment: (NOTE) At the manufacturer cut-off value of 0.5 g/mL FEU, this assay has a negative predictive value of 95-100%.This assay is intended for use in conjunction with a clinical pretest probability (PTP) assessment model to exclude pulmonary embolism (PE) and deep venous thrombosis (DVT) in outpatients suspected of PE or DVT. Results should be correlated with clinical presentation. Performed at [facility], [address] [place], [place] [address]     Current Facility-Administered Medications  Medication Dose Route Frequency Provider Last Rate Last Admin   (feeding supplement) PROSource Plus liquid 30 mL  30 mL Oral BID BM Thurnell Lose, MD   30 mL at 08/13/21 1403   acetaminophen (TYLENOL) tablet 650 mg  650 mg Oral Q6H PRN Athena Masse, MD       cefTRIAXone (ROCEPHIN) 1 g in sodium chloride 0.9 % 100 mL IVPB  1 g Intravenous Q24H Lala Lund K, MD 200 mL/hr at 08/13/21 0549 1 g at 08/13/21 0549   DULoxetine (CYMBALTA) DR capsule 30 mg  30 mg Oral Daily Judd Gaudier V, MD   30 mg at 08/13/21 0858   enoxaparin (LOVENOX) injection 30 mg  30 mg Subcutaneous Q24H Judd Gaudier V, MD   30 mg at 08/12/21 2233   feeding supplement (ENSURE ENLIVE / ENSURE PLUS) liquid  237 mL  237 mL Oral TID BM Thurnell Lose, MD   237 mL at 08/13/21 1403   lactated ringers infusion    Intravenous Continuous Thurnell Lose, MD 75 mL/hr at 08/13/21 0856 New Bag at 08/13/21 0856   levothyroxine (SYNTHROID) tablet 25 mcg  25 mcg Oral Q0600 Thurnell Lose, MD       megestrol (MEGACE) tablet 40 mg  40 mg Oral Daily Athena Masse, MD   40 mg at 08/13/21 0858   mirtazapine (REMERON SOL-TAB) disintegrating tablet 30 mg  30 mg Oral QHS Thurnell Lose, MD       multivitamin with minerals tablet 1 tablet  1 tablet Oral Daily Thurnell Lose, MD   1 tablet at 08/13/21 0942   ondansetron (ZOFRAN) injection 4 mg  4 mg Intravenous Q6H PRN Athena Masse, MD       senna-docusate (Senokot-S) tablet 1 tablet  1 tablet Oral QHS PRN Athena Masse, MD        Musculoskeletal: Strength & Muscle Tone: within normal limits Gait & Station: normal Patient leans: N/A            Psychiatric Specialty Exam:  Presentation  General Appearance:  No data recorded Eye Contact: No data recorded Speech: No data recorded Speech Volume: No data recorded Handedness: No data recorded  Mood and Affect  Mood: No data recorded Affect: No data recorded  Thought Process  Thought Processes: No data recorded Descriptions of Associations:No data recorded Orientation:No data recorded Thought Content:No data recorded History of Schizophrenia/Schizoaffective disorder:No data recorded Duration of Psychotic Symptoms:No data recorded Hallucinations:No data recorded Ideas of Reference:No data recorded Suicidal Thoughts:No data recorded Homicidal Thoughts:No data recorded  Sensorium  Memory: No data recorded Judgment: No data recorded Insight: No data recorded  Executive Functions  Concentration: No data recorded Attention Span: No data recorded Recall: No data recorded Fund of Knowledge: No data recorded Language: No data recorded  Psychomotor Activity  Psychomotor Activity: No data recorded  Assets  Assets: No data recorded  Sleep  Sleep: No data  recorded  Physical Exam: Physical Exam Vitals and nursing note reviewed.  Constitutional:      Appearance: Normal appearance.  HENT:     Head: Normocephalic and atraumatic.     Mouth/Throat:     Pharynx: Oropharynx is clear.  Eyes:     Pupils: Pupils are equal, round, and reactive to light.  Cardiovascular:     Rate and Rhythm: Normal rate and regular rhythm.  Pulmonary:     Effort: Pulmonary effort is normal.     Breath sounds: Normal breath sounds.  Abdominal:     General: Abdomen is flat.     Palpations: Abdomen is soft.  Musculoskeletal:        General: Normal range of motion.  Skin:    General: Skin is warm and dry.  Neurological:     General: No focal deficit present.     Mental Status: She is alert. Mental status is at baseline.  Psychiatric:        Mood and Affect: Mood normal.        Thought Content: Thought content normal.   Review of Systems  Constitutional: Negative.   HENT: Negative.    Eyes: Negative.   Respiratory: Negative.    Cardiovascular: Negative.   Gastrointestinal: Negative.   Musculoskeletal: Negative.   Skin: Negative.   Neurological: Negative.   Psychiatric/Behavioral: Negative.    Blood pressure (!) 131/48,  pulse 78, temperature 98.3 F (36.8 C), temperature source Oral, resp. rate 20, height '5\' 3"'$  (1.6 m), weight 39 kg, SpO2 100 %. Body mass index is 15.23 kg/m.  Treatment Plan Summary: Plan patient doing well.  Recommend continuing current medicine including the 30 mg at night of mirtazapine.  No need for specific psychiatric follow-up this can be done through primary care physician.  Supportive counseling and encouragement patient.  Disposition: No evidence of imminent risk to self or others at present.   Patient does not meet criteria for psychiatric inpatient admission. Supportive therapy provided about ongoing stressors.  Alethia Berthold, MD 08/13/2021 4:56 PM

## 2021-08-13 NOTE — Progress Notes (Addendum)
PROGRESS NOTE                                                                                                                                                                                                             Patient Demographics:    Krystal Richardson, is a 87 y.o. female, DOB - [birth year], [id number]  Outpatient Primary MD for the patient is Tracie Harrier, MD    LOS - 1  Admit date - 08/11/2021    Chief Complaint  Patient presents with   Failure To Thrive       Brief Narrative (HPI from H&P)  - Krystal Richardson is a 87 y.o. female with medical history significant for Hypothyroidism, peptic ulcer disease, thyroid nodules, hearing impairment, melanoma s/p radiation therapy, recently hospitalized from 7/27-8/1 with failure to thrive and COVID infection, s/p remdesivir, who responded well to hydration and Megace and was doing well at discharge until 6 days ago when her oral intake decreased and she again became very weak prompting the visit to the ED today.  Apparently her husband died few weeks ago and since then she has been depressed and not eating or drinking well, she temporarily improved after her recent hospitalization however last week [name] her husband's birthday and since that day she has become extremely sad and stopped eating again.  In the ER she was diagnosed with dehydration, UTI along with severe PCM and depression and admitted to the hospital.   Subjective:   Patient in bed, appears comfortable, denies any headache, no fever, no chest pain or pressure, no shortness of breath , no abdominal pain. No new focal weakness.    Assessment  & Plan :     Depression with poor appetite, reduced oral intake, dehydration, UTI and failure to thrive - she is being treated by PCP with antidepressants outpatient without much success, she has been seen here by psych medications adjusted with good results, in much better  spirits on 08/13/2021, appetite has improved, continue present medication regimen and monitor.  Complete 3 doses of IV Rocephin for UTI.  2.  Hydration with orthostatic hypotension.  Dehydration with IV fluids, monitor orthostatics, add TED stockings, PT OT and monitor.  She is 87 year old and lives by herself, currently not safe for discharge due to depression, poor oral intake, failure to thrive, dehydration with orthostatic  hypotension.  3.  Recent history of COVID-19 infection.  CT count over 39.  No further treatment or isolation needed.  4. Severe PCM with poor appetite and depression - on Megace, psych to evaluate for depression, protein supplements added.   5.  Hypothyroidism.  Continue home dose Synthroid TSH is stable.  6.  HX of melanoma.  Outpatient age-appropriate work-up and follow-up by PCP.  7.  Elevated D-dimer.  Due to combination of dehydration and recent COVID-19 information.  Venous ultrasound lower EXTR venous shows no acute DVT but does have a chronic right popliteal clot, this is chronic and I do not think it is contributing to her clinical condition.  We will request PCP to repeat venous ultrasound in 2 weeks to document stability.  TED stockings added.       Condition - Extremely Guarded  Family Communication  :  son bedside on 08/12/2021, 08/13/2021  Code Status :  DNR  Consults  :  Psych  PUD Prophylaxis :    Procedures  :      Leg [place] -   1. No evidence of acute DVT within either lower extremity 2. Short-segment nonocclusive echogenic wall thickening/chronic DVT involving the distal aspect of the right popliteal vein. 3. Incidental note made of an approximately 2.7 cm left-sided Baker's cyst.      Disposition Plan  :    Status is: Observation  Dispo: The patient is from: Home              Anticipated d/c is to: Home              Patient currently is not medically stable to d/c.   Difficult to place patient No  DVT Prophylaxis  :    enoxaparin  (LOVENOX) injection 30 mg Start: 08/12/21 2200    Lab Results  Component Value Date   PLT 220 08/13/2021    Diet :  Diet Order             Diet regular Room service appropriate? Yes with Assist; Fluid consistency: Thin  Diet effective now                    Inpatient Medications  Scheduled Meds:  (feeding supplement) PROSource Plus  30 mL Oral BID BM   DULoxetine  30 mg Oral Daily   enoxaparin (LOVENOX) injection  30 mg Subcutaneous Q24H   feeding supplement  237 mL Oral TID BM   levothyroxine  25 mcg Oral Q0600   megestrol  40 mg Oral Daily   mirtazapine  15 mg Oral QHS   multivitamin with minerals  1 tablet Oral Daily   Continuous Infusions:  cefTRIAXone (ROCEPHIN)  IV 1 g (08/13/21 0549)   lactated ringers 75 mL/hr at 08/13/21 0856   PRN Meds:.acetaminophen **OR** [DISCONTINUED] acetaminophen, [DISCONTINUED] ondansetron **OR** ondansetron (ZOFRAN) IV, senna-docusate  Antibiotics  :    Anti-infectives (From admission, onward)    Start     Dose/Rate Route Frequency Ordered Stop   08/13/21 0600  cefTRIAXone (ROCEPHIN) 1 g in sodium chloride 0.9 % 100 mL IVPB        1 g 200 mL/hr over 30 Minutes Intravenous Every 24 hours 08/12/21 0229     08/12/21 0000  cefTRIAXone (ROCEPHIN) 1 g in sodium chloride 0.9 % 100 mL IVPB        1 g 200 mL/hr over 30 Minutes Intravenous  Once 08/11/21 2354 08/12/21 0053  Time Spent in minutes  30   Lala Lund M.D on 08/13/2021 at 9:30 AM  To page go to [email or web address]   [facility] -  Office  [phone]    See all Orders from today for further details    Objective:   Vitals:   08/12/21 1659 08/12/21 1956 08/13/21 0609 08/13/21 0738  BP: (!) 133/58 (!) 116/50 (!) 127/53 (!) 131/48  Pulse: 75 73 73 78  Resp: '18 18 16 20  '$ Temp:  98.1 F (36.7 C) 98.1 F (36.7 C) 98.3 F (36.8 C)  TempSrc:    Oral  SpO2: 99% 100% 100% 100%  Weight:      Height:        Wt Readings from Last 3 Encounters:   08/11/21 39 kg  07/21/21 46 kg  07/18/21 46.7 kg    No intake or output data in the 24 hours ending 08/13/21 0930    Physical Exam  Awake Alert, No new F.N deficits, Normal affect [place].AT,PERRAL Supple Neck,No JVD, No cervical lymphadenopathy appriciated.  Symmetrical Chest wall movement, Good air movement bilaterally, CTAB RRR,No Gallops, Rubs or new Murmurs, No Parasternal Heave +ve B.Sounds, Abd Soft, No tenderness, No organomegaly appriciated, No rebound - guarding or rigidity. No Cyanosis, Clubbing or edema, No new Rash or bruise     Data Review:    CBC Recent Labs  Lab 08/11/21 1642 08/12/21 0546 08/13/21 0402  WBC 7.9 8.0 5.7  HGB 12.6 9.7* 9.1*  HCT 35.2* 27.2* 25.8*  PLT 271 219 220  MCV 85.6 86.9 88.7  MCH 30.7 31.0 31.3  MCHC 35.8 35.7 35.3  RDW 15.8* 15.7* 15.7*  LYMPHSABS  --   --  1.3  MONOABS  --   --  0.6  EOSABS  --   --  0.3  BASOSABS  --   --  0.0    Recent Labs  Lab 08/11/21 1642 08/12/21 0546 08/12/21 0723 08/12/21 0823 08/13/21 0402  NA 130* 129*  --   --  131*  K 3.6 3.3*  --   --  3.9  CL 101 102  --   --  106  CO2 18* 21*  --   --  22  GLUCOSE 127* 85  --   --  90  BUN 43* 35*  --   --  14  CREATININE 0.84 0.64  --   --  0.53  CALCIUM 9.3 8.0*  --   --  8.0*  AST 26  --   --   --  16  ALT 21  --   --   --  16  ALKPHOS 72  --   --   --  55  BILITOT 1.0  --   --   --  0.7  ALBUMIN 3.9  --   --   --  2.8*  MG  --   --   --  2.0 2.0  CRP  --   --   --  1.6*  --   DDIMER  --   --   --  4.69* 2.58*  TSH 4.924*  --   --   --   --   BNP  --   --  112.3*  --  250.0*    ------------------------------------------------------------------------------------------------------------------ No results for input(s): CHOL, HDL, LDLCALC, TRIG, CHOLHDL, LDLDIRECT in the last 72 hours.  Lab Results  Component Value Date   HGBA1C 6.2 (H) 07/21/2021    ------------------------------------------------------------------------------------------------------------------ Recent Labs    08/11/21  1642  TSH 4.924*    Cardiac Enzymes No results for input(s): CKMB, TROPONINI, MYOGLOBIN in the last 168 hours.  Invalid input(s): CK ------------------------------------------------------------------------------------------------------------------    Component Value Date/Time   BNP 250.0 (H) 08/13/2021 0402

## 2021-08-13 NOTE — Progress Notes (Signed)
Physical Therapy Treatment Patient Details Name: Krystal Richardson MRN: [id number] DOB: [birth year] Today's Date: 08/13/2021    History of Present Illness Krystal Richardson is a 63yoF who [facility] c ongoign weakness, poor PO intake, dysuria. Pt here for c FTT 3 weeks ago for similar compalint related to covid infection. Per her son, patient has been unwell for the more than 5 weeks since the passing of her husband. Pt has had annorexia, nausea s vomitting, poor PO intake, now referred from her PCP because she has had a 16 pound weight loss in the last 2 weeks. PMH: HLD, hypoTSH, basal cell carcinoma, melanoma s/p radiation, gastric ulcer, HOH.    PT Comments    Pt received sitting up in chair, agreeable to therapy. PT explained goals of today's session to assess orthostatics - education provided on orthostatics as well as signs and symptoms and preventitive measures including increased time to transition, ted hose and performing LE exercises/movement (ankle pumps/LAQ/etc), prior to standing. MD and RN notified of results. Pt did perform bed mobility with decreased assist compared to yesterday's evaluation. She continues to demo decreased stability in standing. Would benefit from skilled PT to address above deficits and promote optimal return to PLOF.   Follow Up Recommendations  Home health PT;Supervision for mobility/OOB     Equipment Recommendations  None recommended by PT    Recommendations for Other Services       Precautions / Restrictions Precautions Precautions: Fall Precaution Comments: orthostatic syncope history recently Restrictions Weight Bearing Restrictions: No    Mobility  Bed Mobility Overal bed mobility: Needs Assistance Bed Mobility: Supine to Sit;Sit to Supine     Supine to sit: Supervision Sit to supine: Supervision   General bed mobility comments: SUP for safety, bed rails used. Increased effort and time to complete.    Transfers Overall transfer  level: Needs assistance Equipment used: None Transfers: Sit to/from [facility] Sit to Stand: Min guard         General transfer comment: CGA to steady upon standing and during pivot from chair<>bed.  Ambulation/Gait             General Gait Details: deferred   Stairs             Wheelchair Mobility    Modified Rankin (Stroke Patients Only)       Balance Overall balance assessment: History of Falls Sitting-balance support: No upper extremity supported;Feet supported Sitting balance-Leahy Scale: Good Sitting balance - Comments: Good static sitting balance at EOB   Standing balance support: No upper extremity supported Standing balance-Leahy Scale: Fair Standing balance comment: intermittant CGA for static standing, constant CGA for dynamic standing during transfer                            Cognition Arousal/Alertness: Awake/alert Behavior During Therapy: WFL for tasks assessed/performed Overall Cognitive Status: No family/caregiver present to determine baseline cognitive functioning                                 General Comments: pleasant      Exercises Other Exercises Other Exercises: Orthostatics were assessed. Education on orthostatics and preventitive measures including increased time to transtion and performing LE exercises/movement (ankle pumps/LAQ/etc), prior to standing. Pt also educated on ted hose.    General Comments        Pertinent Vitals/Pain  Pain Assessment: No/denies pain    Home Living                      Prior Function            PT Goals (current goals can now be found in the care plan section) Acute Rehab PT Goals Patient Stated Goal: return to home for recovery    Frequency    Min 2X/week      PT Plan      Co-evaluation              AM-PAC PT "6 Clicks" Mobility   Outcome Measure  Help needed turning from your back to your side while in a flat bed  without using bedrails?: A Little Help needed moving from lying on your back to sitting on the side of a flat bed without using bedrails?: A Little Help needed moving to and from a bed to a chair (including a wheelchair)?: A Little Help needed standing up from a chair using your arms (e.g., wheelchair or bedside chair)?: A Little Help needed to walk in hospital room?: A Little Help needed climbing 3-5 steps with a railing? : A Lot 6 Click Score: 17    End of Session Equipment Utilized During Treatment: Gait belt Activity Tolerance: Patient tolerated treatment well Patient left: with call bell/phone within reach;in chair Nurse Communication: Mobility status (orthostatic results; reminder of ted hose order) PT Visit Diagnosis: Muscle weakness (generalized) (M62.81);Other abnormalities of gait and mobility (R26.89);Difficulty in walking, not elsewhere classified (R26.2);Adult, failure to thrive (R62.7);Unsteadiness on feet (R26.81)     Time: [id number] PT Time Calculation (min) (ACUTE ONLY): 16 min  Charges:  $Therapeutic Activity: 8-22 mins                    Patrina Levering PT, DPT 08/13/21 1:43 PM [phone]

## 2021-08-13 NOTE — Progress Notes (Signed)
Initial Nutrition Assessment  DOCUMENTATION CODES:  Severe malnutrition in context of acute illness/injury, Underweight  INTERVENTION:  Increase Ensure Enlive/Plus from BID to TID.  Add Magic cup TID with meals, each supplement provides 290 kcal and 9 grams of protein.  Continue ProSource Plus BID.  Add MVI with minerals daily.  Recommend liberalizing diet to regular to promote intake.  Encourage PO and supplement intake.  NUTRITION DIAGNOSIS:  Severe Malnutrition related to acute illness (recent onset of significant depression) as evidenced by severe fat depletion, severe muscle depletion, percent weight loss.  GOAL:  Patient will meet greater than or equal to 90% of their needs  MONITOR:  PO intake, Supplement acceptance, Diet advancement, Labs, Weight trends, I & O's  REASON FOR ASSESSMENT:  Other (Comment) (Low BMI)    ASSESSMENT:  87 y.o. female with medical history significant for Hypothyroidism, peptic ulcer disease, thyroid nodules, hearing impairment, melanoma s/p radiation therapy, recently hospitalized from 7/27-8/1 with failure to thrive and COVID infection, s/p remdesivir, who responded well to hydration and Megace and was doing well at discharge until 6 days ago when her oral intake decreased and she again became very weak prompting the visit to the ED today.  Per pt, her husband died few weeks ago and since then she has been depressed and not eating or drinking well, she temporarily improved after her recent hospitalization however last week [name] her husband's birthday and since that day she has become extremely sad and stopped eating again.  In the ER she was diagnosed with dehydration, UTI along with severe PCM and depression and admitted to the hospital.  Pt endorses above information, and so does son.  Per Epic, pt has lost 15 lbs (15%) in the last 3 weeks, which is significant and severe for the time frame.  Exam shows severe depletion in all  areas.  Recommend increasing Ensure to TID from BID, adding Magic Cup TID, and continuing ProSource Plus BID; recommend starting MVI with minerals.  Also recommend liberalizing diet to regular to promote more choices. Secure chatted MD regarding this.  Medications: reviewed; ProSource Plus BID, Cymbalta, EE/EP BID, Synthroid, Megace, Remeron, LR @ 75 ml/hr  Labs: reviewed; Na 131 (L)  NUTRITION - FOCUSED PHYSICAL EXAM: Flowsheet Row Most Recent Value  Orbital Region Severe depletion  Upper Arm Region Severe depletion  Thoracic and Lumbar Region Severe depletion  Buccal Region Severe depletion  Temple Region Severe depletion  Clavicle Bone Region Severe depletion  Clavicle and Acromion Bone Region Severe depletion  Scapular Bone Region Severe depletion  Dorsal Hand Severe depletion  Patellar Region Severe depletion  Anterior Thigh Region Severe depletion  Posterior Calf Region Severe depletion  Edema (RD Assessment) None  Hair Reviewed  Eyes Reviewed  Mouth Reviewed  Skin Reviewed  Nails Reviewed   Diet Order:   Diet Order             Diet regular Room service appropriate? Yes with Assist; Fluid consistency: Thin  Diet effective now                  EDUCATION NEEDS:  No education needs have been identified at this time  Skin:  Skin Assessment: Reviewed RN Assessment  Last BM:  PTA (pt unsure)  Height:  Ht Readings from Last 1 Encounters:  08/11/21 '5\' 3"'$  (1.6 m)   Weight:  Wt Readings from Last 1 Encounters:  08/11/21 39 kg   BMI:  Body mass index is 15.23 kg/m.  Estimated Nutritional  Needs:  Kcal:  1450-1650 Protein:  55-70 grams Fluid:  >1.45 L  Derrel Nip, RD, LDN (she/her/hers) Registered Dietitian I After-Hours/Weekend Pager # in [place]

## 2021-08-14 DIAGNOSIS — R638 Other symptoms and signs concerning food and fluid intake: Secondary | ICD-10-CM | POA: Diagnosis not present

## 2021-08-14 DIAGNOSIS — N390 Urinary tract infection, site not specified: Secondary | ICD-10-CM | POA: Diagnosis not present

## 2021-08-14 DIAGNOSIS — R600 Localized edema: Secondary | ICD-10-CM | POA: Diagnosis not present

## 2021-08-14 LAB — COMPREHENSIVE METABOLIC PANEL
ALT: 14 U/L (ref 0–44)
AST: 17 U/L (ref 15–41)
Albumin: 2.8 g/dL — ABNORMAL LOW (ref 3.5–5.0)
Alkaline Phosphatase: 52 U/L (ref 38–126)
Anion gap: 5 (ref 5–15)
BUN: 15 mg/dL (ref 8–23)
CO2: 22 mmol/L (ref 22–32)
Calcium: 8.2 mg/dL — ABNORMAL LOW (ref 8.9–10.3)
Chloride: 107 mmol/L (ref 98–111)
Creatinine, Ser: 0.49 mg/dL (ref 0.44–1.00)
GFR, Estimated: >60 mL/min (ref >60)
Glucose, Bld: 83 mg/dL (ref 70–99)
Potassium: 3.2 mmol/L — ABNORMAL LOW (ref 3.5–5.1)
Sodium: 134 mmol/L — ABNORMAL LOW (ref 135–145)
Total Bilirubin: 0.8 mg/dL (ref 0.3–1.2)
Total Protein: 5 g/dL — ABNORMAL LOW (ref 6.5–8.1)

## 2021-08-14 LAB — CBC WITH DIFFERENTIAL/PLATELET
Abs Immature Granulocytes: 0.04 10*3/uL (ref 0.00–0.07)
Basophils Absolute: 0.1 10*3/uL (ref 0.0–0.1)
Basophils Relative: 2 %
Eosinophils Absolute: 0.3 10*3/uL (ref 0.0–0.5)
Eosinophils Relative: 8 %
HCT: 25.8 % — ABNORMAL LOW (ref 36.0–46.0)
Hemoglobin: 9.1 g/dL — ABNORMAL LOW (ref 12.0–15.0)
Immature Granulocytes: 1 %
Lymphocytes Relative: 35 %
Lymphs Abs: 1.4 10*3/uL (ref 0.7–4.0)
MCH: 31.5 pg (ref 26.0–34.0)
MCHC: 35.3 g/dL (ref 30.0–36.0)
MCV: 89.3 fL (ref 80.0–100.0)
Monocytes Absolute: 0.5 10*3/uL (ref 0.1–1.0)
Monocytes Relative: 13 %
Neutro Abs: 1.7 10*3/uL (ref 1.7–7.7)
Neutrophils Relative %: 41 %
Platelets: 205 10*3/uL (ref 150–400)
RBC: 2.89 MIL/uL — ABNORMAL LOW (ref 3.87–5.11)
RDW: 15.9 % — ABNORMAL HIGH (ref 11.5–15.5)
WBC: 4 10*3/uL (ref 4.0–10.5)
nRBC: 0 % (ref 0.0–0.2)

## 2021-08-14 LAB — BRAIN NATRIURETIC PEPTIDE: B Natriuretic Peptide: 143.9 pg/mL — ABNORMAL HIGH (ref 0.0–100.0)

## 2021-08-14 LAB — MAGNESIUM: Magnesium: 1.9 mg/dL (ref 1.7–2.4)

## 2021-08-14 LAB — D-DIMER, QUANTITATIVE: D-Dimer, Quant: 2.68 ug/mL-FEU — ABNORMAL HIGH (ref 0.00–0.50)

## 2021-08-14 LAB — C-REACTIVE PROTEIN: CRP: 1.8 mg/dL — ABNORMAL HIGH (ref <1.0)

## 2021-08-14 MED ORDER — ASPIRIN EC 81 MG PO TBEC: 81.0000 mg | DELAYED_RELEASE_TABLET | Freq: Every day | ORAL | 0 refills | Status: DC

## 2021-08-14 MED ORDER — MEGESTROL ACETATE 40 MG PO TABS: 40.0000 mg | ORAL_TABLET | Freq: Every day | ORAL | 0 refills | Status: AC

## 2021-08-14 MED ORDER — CEPHALEXIN 500 MG PO CAPS: 500.0000 mg | ORAL_CAPSULE | Freq: Three times a day (TID) | ORAL | 0 refills | Status: AC

## 2021-08-14 MED ORDER — MIRTAZAPINE 30 MG PO TBDP: 30.0000 mg | ORAL_TABLET | Freq: Every day | ORAL | 0 refills | Status: DC

## 2021-08-14 MED ORDER — PANTOPRAZOLE SODIUM 40 MG PO TBEC: 40.0000 mg | DELAYED_RELEASE_TABLET | Freq: Every day | ORAL | 0 refills | Status: DC

## 2021-08-14 MED ORDER — POTASSIUM CHLORIDE CRYS ER 20 MEQ PO TBCR
40.0000 meq | EXTENDED_RELEASE_TABLET | Freq: Once | ORAL | Status: AC
  Administered 2021-08-14: 10:00:00 40 meq via ORAL
  Filled 2021-08-14: qty 2

## 2021-08-14 NOTE — Discharge Instructions (Signed)
Follow with Primary MD Tracie Harrier, MD in 7 days   Get CBC, CMP -  checked next visit within 1 week by Primary MD     Activity: As tolerated with Full fall precautions use walker/cane & assistance as needed  Disposition Home    Diet: Heart Healthy    Special Instructions: If you have smoked or chewed Tobacco  in the last 2 yrs please stop smoking, stop any regular Alcohol  and or any Recreational drug use.  On your next visit with your primary care physician please Get Medicines reviewed and adjusted.  Please request your Prim.MD to go over all Hospital Tests and Procedure/Radiological results at the follow up, please get all Hospital records sent to your Prim MD by signing hospital release before you go home.  If you experience worsening of your admission symptoms, develop shortness of breath, life threatening emergency, suicidal or homicidal thoughts you must seek medical attention immediately by calling 911 or calling your MD immediately  if symptoms less severe.  You Must read complete instructions/literature along with all the possible adverse reactions/side effects for all the Medicines you take and that have been prescribed to you. Take any new Medicines after you have completely understood and accpet all the possible adverse reactions/side effects.

## 2021-08-14 NOTE — Discharge Summary (Signed)
Krystal Richardson [id number] DOB: [birth year] DOA: 08/11/2021  PCP: Krystal Harrier, MD  Admit date: 08/11/2021  Discharge date: 08/14/2021  Admitted From: Home  Disposition:  Home   Recommendations for Outpatient Follow-up:   Follow up with PCP in 1-2 weeks  PCP Please obtain BMP/CBC, 2 view CXR in 1week,  (see Discharge instructions)   PCP Please follow up on the following pending results:    Home Health: Patient has Home PT and care per son   Equipment/Devices: None  Consultations: Psych Discharge Condition: Stable    CODE STATUS: DNR Diet Recommendation: Heart Healthy     Chief Complaint  Patient presents with   Failure To Thrive     Brief history of present illness from the day of admission and additional interim summary    Krystal Richardson is a 87 y.o. female with medical history significant for Hypothyroidism, peptic ulcer disease, thyroid nodules, hearing impairment, melanoma s/p radiation therapy, recently hospitalized from 7/27-8/1 with failure to thrive and COVID infection, s/p remdesivir, who responded well to hydration and Megace and was doing well at discharge until 6 days ago when her oral intake decreased and she again became very weak prompting the visit to the ED today.  Apparently her husband died few weeks ago and since then she has been depressed and not eating or drinking well, she temporarily improved after her recent hospitalization however last week [name] her husband's birthday and since that day she has become extremely sad and stopped eating again.  In the ER she was diagnosed with dehydration, UTI along with severe PCM and depression and admitted to the hospital.                                                                 Hospital Course    Depression with poor appetite, reduced  oral intake, dehydration, UTI and failure to thrive - she is being treated by PCP with antidepressants outpatient without much success, she has been seen here by psych medications adjusted with good results, in much better spirits now with good appetite, has been adequately hydrated will be discharged home, per son patient already has home PT and RN which will be continued, follow with PCP in a week.   2.  Dehydration with orthostatic hypotension.  Dehydration was treated with IV fluids and TED stockings, much improved.  Continue home PT, follow-up with PCP.   3.  Recent history of COVID-19 infection.  CT count over 39.  No further treatment or isolation needed.   4. Severe PCM with poor appetite and depression - on Megace, psych evaluation done for depression and medications adjusted, protein supplements were given here and should be continued at home as well.   5.  Hypothyroidism.  Continue home dose Synthroid TSH is stable.  6.  HX of melanoma.  Outpatient age-appropriate work-up and follow-up by PCP.   7.  UTI.  Rocephin while in the hospital 2 more days of oral Keflex upon discharge.  Culture was inconclusive but UA was suspicious for UTI.  8. Elevated D-dimer.  Due to combination of dehydration and recent COVID-19 information.  Venous ultrasound of the Legs shows no acute DVT but does have a chronic right popliteal clot, this is chronic and I do not think it is contributing to her clinical condition.  We will request PCP to repeat venous ultrasound in 2 weeks to document stability.  Baby aspirin added to her home regimen for 4 weeks as she is on Megace and that can predispose her to acute DVT.   Discharge diagnosis     Principal Problem:   Depression Active Problems:   Hyponatremia   Hypothyroidism   Failure to thrive in adult   Lab test positive for detection of COVID-19 virus   UTI (urinary tract infection)   Decreased oral intake   Generalized weakness   Protein-calorie  malnutrition, severe    Discharge instructions    Discharge Instructions     Diet - low sodium heart healthy   Complete by: As directed    Discharge instructions   Complete by: As directed    Follow with Primary MD Krystal Harrier, MD in 7 days   Get CBC, CMP -  checked next visit within 1 week by Primary MD     Activity: As tolerated with Full fall precautions use walker/cane & assistance as needed  Disposition Home    Diet: Heart Healthy    Special Instructions: If you have smoked or chewed Tobacco  in the last 2 yrs please stop smoking, stop any regular Alcohol  and or any Recreational drug use.  On your next visit with your primary care physician please Get Medicines reviewed and adjusted.  Please request your Prim.MD to go over all Hospital Tests and Procedure/Radiological results at the follow up, please get all Hospital records sent to your Prim MD by signing hospital release before you go home.  If you experience worsening of your admission symptoms, develop shortness of breath, life threatening emergency, suicidal or homicidal thoughts you must seek medical attention immediately by calling 911 or calling your MD immediately  if symptoms less severe.  You Must read complete instructions/literature along with all the possible adverse reactions/side effects for all the Medicines you take and that have been prescribed to you. Take any new Medicines after you have completely understood and accpet all the possible adverse reactions/side effects.   Increase activity slowly   Complete by: As directed    MyChart COVID-19 home monitoring program   Complete by: Aug 14, 2021    Is the patient willing to use the [place] for home monitoring?: Yes   Temperature monitoring   Complete by: Aug 14, 2021    After how many days would you like to receive a notification of this patient's flowsheet entries?: 1       Discharge Medications   Allergies as of 08/14/2021        Reactions   Toprol Xl [metoprolol Tartrate]         Medication List     TAKE these medications    aspirin EC 81 MG tablet Take 1 tablet (81 mg total) by mouth daily.   cephALEXin 500 MG capsule Commonly known as: KEFLEX Take 1 capsule (500 mg total) by mouth  3 (three) times daily for 6 doses.   DULoxetine 30 MG capsule Commonly known as: CYMBALTA Take 1 capsule (30 mg total) by mouth daily.   feeding supplement Liqd Take 237 mLs by mouth 2 (two) times daily between meals.   levothyroxine 25 MCG tablet Commonly known as: SYNTHROID Take 25 mcg by mouth daily before breakfast.   megestrol 40 MG tablet Commonly known as: MEGACE Take 1 tablet (40 mg total) by mouth daily for 15 days.   mirtazapine 30 MG disintegrating tablet Commonly known as: REMERON SOL-TAB Take 1 tablet (30 mg total) by mouth at bedtime. What changed:  medication strength how much to take   pantoprazole 40 MG tablet Commonly known as: Protonix Take 1 tablet (40 mg total) by mouth daily.         Follow-up Information     Krystal Harrier, MD. Schedule an appointment as soon as possible for a visit in 1 week(s).   Specialty: Internal Medicine Contact information: [place] [address] [phone]                 Major procedures and Radiology Reports - PLEASE review detailed and final reports thoroughly  -       DG Chest 2 View  Result Date: 07/18/2021 CLINICAL DATA:  Shortness of breath EXAM: CHEST - 2 VIEW COMPARISON:  None. FINDINGS: The lungs are hyperinflated with diffuse interstitial prominence. No focal airspace consolidation or pulmonary edema. No pleural effusion or pneumothorax. Normal cardiomediastinal contours. IMPRESSION: Hyperinflation without acute airspace disease. Electronically Signed   By: Ulyses Jarred M.D.   On: 07/18/2021 21:11   CT ABDOMEN PELVIS W CONTRAST  Result Date: 07/23/2021 CLINICAL DATA:  Intractable nausea and vomiting.  COVID-19 viral infection. EXAM: CT ABDOMEN AND PELVIS WITH CONTRAST TECHNIQUE: Multidetector CT imaging of the abdomen and pelvis was performed using the standard protocol following bolus administration of intravenous contrast. CONTRAST:  39m OMNIPAQUE IOHEXOL 350 MG/ML SOLN COMPARISON:  None. FINDINGS: Lower chest: No acute findings. Hepatobiliary: No mass visualized on this unenhanced exam. Gallbladder not well visualized and may be contracted. No evidence of biliary ductal dilatation. Pancreas: No mass or inflammatory process visualized on this unenhanced exam. Spleen:  Within normal limits in size. Adrenals/Urinary tract: No evidence of urolithiasis or hydronephrosis. Benign-appearing cyst seen in upper pole of left kidney. Unremarkable unopacified urinary bladder. Stomach/Bowel: No evidence of obstruction, inflammatory process, or abnormal fluid collections. Diverticulosis is seen mainly involving the sigmoid colon, however there is no evidence of diverticulitis. Vascular/Lymphatic: No pathologically enlarged lymph nodes identified. No evidence of abdominal aortic aneurysm. Aortic atherosclerotic calcification noted. Reproductive: Uterus is unremarkable. A solid mass is seen in the left adnexal region and pelvic sidewall, which contains heterogeneous calcifications. This measures 3.4 x 3.2 cm on image 60/2. No other pelvic masses identified. No evidence of free fluid. Other:  None. Musculoskeletal: Bilateral sacral insufficiency fractures are noted as well as an insufficiency fracture of the left pubis. IMPRESSION: Colonic diverticulosis, without radiographic evidence of diverticulitis. 3.4 cm solid left adnexal mass with heterogeneous calcifications. Differential diagnosis includes pedunculated fibroid, ovarian mass, lymphadenopathy, or mesenchymal neoplasm. Consider pelvic MRI without and with contrast for further characterization. Insufficiency fractures of bilateral sacral ala and left pubis.  Electronically Signed   By: JMarlaine HindM.D.   On: 07/23/2021 18:23   [place]Venous Img Lower Bilateral (DVT)  Result Date: 08/12/2021 CLINICAL DATA:  History of pulmonary embolism. History of malignancy. Evaluate for lower extremity DVT.  EXAM: BILATERAL LOWER EXTREMITY VENOUS DOPPLER ULTRASOUND TECHNIQUE: Gray-scale sonography with graded compression, as well as color Doppler and duplex ultrasound were performed to evaluate the lower extremity deep venous systems from the level of the common femoral vein and including the common femoral, femoral, profunda femoral, popliteal and calf veins including the posterior tibial, peroneal and gastrocnemius veins when visible. The superficial great saphenous vein was also interrogated. Spectral Doppler was utilized to evaluate flow at rest and with distal augmentation maneuvers in the common femoral, femoral and popliteal veins. COMPARISON:  None. FINDINGS: RIGHT LOWER EXTREMITY Common Femoral Vein: No evidence of thrombus. Normal compressibility, respiratory phasicity and response to augmentation. Saphenofemoral Junction: No evidence of thrombus. Normal compressibility and flow on color Doppler imaging. Profunda Femoral Vein: No evidence of thrombus. Normal compressibility and flow on color Doppler imaging. Femoral Vein: No evidence of thrombus. Normal compressibility, respiratory phasicity and response to augmentation. Popliteal Vein: There is a minimal amount of eccentric echogenic wall thickening/chronic DVT involving the distal aspect of the right popliteal vein (image 22). Calf Veins: No evidence of thrombus. Normal compressibility and flow on color Doppler imaging. Superficial Great Saphenous Vein: No evidence of thrombus. Normal compressibility. Venous Reflux:  None. Other Findings:  None. LEFT LOWER EXTREMITY Common Femoral Vein: No evidence of thrombus. Normal compressibility, respiratory phasicity and response to augmentation. Saphenofemoral Junction: No evidence  of thrombus. Normal compressibility and flow on color Doppler imaging. Profunda Femoral Vein: No evidence of thrombus. Normal compressibility and flow on color Doppler imaging. Femoral Vein: No evidence of thrombus. Normal compressibility, respiratory phasicity and response to augmentation. Popliteal Vein: No evidence of thrombus. Normal compressibility, respiratory phasicity and response to augmentation. Calf Veins: No evidence of thrombus. Normal compressibility and flow on color Doppler imaging. Superficial Great Saphenous Vein: No evidence of thrombus. Normal compressibility. Venous Reflux:  None. Other Findings: Note is made of an approximately 2.7 x 1.8 x 0.9 cm anechoic fluid collection with left popliteal fossa compatible with a Baker's cyst. IMPRESSION: 1. No evidence of acute DVT within either lower extremity 2. Short-segment nonocclusive echogenic wall thickening/chronic DVT involving the distal aspect of the right popliteal vein. 3. Incidental note made of an approximately 2.7 cm left-sided Baker's cyst. Electronically Signed   By: Sandi Mariscal M.D.   On: 08/12/2021 16:04   DG Chest Portable 1 View  Result Date: 08/11/2021 CLINICAL DATA:  Shortness of breath EXAM: PORTABLE CHEST 1 VIEW COMPARISON:  07/21/2021 FINDINGS: The heart size and mediastinal contours are within normal limits. Both lungs are clear. The visualized skeletal structures are unremarkable. IMPRESSION: No active disease. Electronically Signed   By: Ulyses Jarred M.D.   On: 08/11/2021 23:46   DG Chest Port 1 View  Result Date: 07/21/2021 CLINICAL DATA:  COVID positive EXAM: PORTABLE CHEST 1 VIEW COMPARISON:  Radiograph 07/18/2021 FINDINGS: Unchanged cardiomediastinal silhouette. Pulmonary hyperinflation, unchanged. There is no new focal airspace disease. No large pleural effusion or visible pneumothorax. No acute osseous abnormality. Thoracic spondylosis. IMPRESSION: No new focal airspace disease.  Unchanged pulmonary  hyperinflation. Electronically Signed   By: Maurine Simmering   On: 07/21/2021 18:55      Today   Subjective    Krystal Richardson today has no headache,no chest abdominal pain,no new weakness tingling or numbness, feels much better wants to go home today.     Objective   Blood pressure 145/85, pulse 74, temperature 98.9 F (37.2 C), temperature source Oral, resp. rate 16, height '5\' 3"'$  (1.6 m), weight 39 kg,  SpO2 99 %.   Intake/Output Summary (Last 24 hours) at 08/14/2021 1021 Last data filed at 08/13/2021 1859 Gross per 24 hour  Intake 1242.54 ml  Output --  Net 1242.54 ml    Exam  Awake Alert, No new F.N deficits, Normal affect Robinson.AT,PERRAL Supple Neck,No JVD, No cervical lymphadenopathy appriciated.  Symmetrical Chest wall movement, Good air movement bilaterally, CTAB RRR,No Gallops,Rubs or new Murmurs, No Parasternal Heave +ve B.Sounds, Abd Soft, Non tender, No organomegaly appriciated, No rebound -guarding or rigidity. No Cyanosis, Clubbing or edema, No new Rash or bruise   Data Review   CBC w Diff:  Lab Results  Component Value Date   WBC 4.0 08/14/2021   HGB 9.1 (L) 08/14/2021   HGB 12.0 09/10/2013   HCT 25.8 (L) 08/14/2021   HCT 34.9 (L) 09/10/2013   PLT 205 08/14/2021   PLT 190 09/10/2013   LYMPHOPCT 35 08/14/2021   LYMPHOPCT 27.4 09/10/2013   MONOPCT 13 08/14/2021   MONOPCT 10.0 09/10/2013   EOSPCT 8 08/14/2021   EOSPCT 1.3 09/10/2013   BASOPCT 2 08/14/2021   BASOPCT 1.1 09/10/2013    CMP:  Lab Results  Component Value Date   NA 134 (L) 08/14/2021   NA 139 09/10/2013   K 3.2 (L) 08/14/2021   K 4.0 09/10/2013   CL 107 08/14/2021   CL 112 (H) 09/10/2013   CO2 22 08/14/2021   CO2 23 09/10/2013   BUN 15 08/14/2021   BUN 11 09/10/2013   CREATININE 0.49 08/14/2021   CREATININE 0.64 09/10/2013   PROT 5.0 (L) 08/14/2021   PROT 7.3 09/09/2013   ALBUMIN 2.8 (L) 08/14/2021   ALBUMIN 4.2 09/09/2013   BILITOT 0.8 08/14/2021   BILITOT 0.3 09/09/2013    ALKPHOS 52 08/14/2021   ALKPHOS 63 09/09/2013   AST 17 08/14/2021   AST 29 09/09/2013   ALT 14 08/14/2021   ALT 25 09/09/2013  .   Total Time in preparing paper work, data evaluation and todays exam - 76 minutes  Lala Lund M.D on 08/14/2021 at 10:21 AM  [facility]

## 2021-08-14 NOTE — TOC Progression Note (Signed)
Transition of Care [facility]) - Progression Note    Patient Details  Name: Krystal Richardson MRN: [id number] Date of Birth: 1929-07-15  Transition of [facility]) CM/SW [place], RN Phone Number: 08/14/2021, 11:14 AM  Clinical Narrative:   Patient lives at home, sons assist with care, and she also has private care aides.  No concerns about transportation to appointments or obtaining medications as her family assists with this.  She reports taking medications as directed without concerns.  She currently has Home Health with [facility].  Sharmon Revere contacted re: discharge.  Ms. Kalman Shan states they are able to continue to provide home health for this patient.  They will contact her tomorrow to arrange.    Patient states she has a walker at home and does not feel she needs additional equipment, and her family will transport her home today and she has no further concerns at this time.         Expected Discharge Plan and Services           Expected Discharge Date: 08/14/21                                     Social Determinants of Health (SDOH) Interventions    Readmission Risk Interventions No flowsheet data found.

## 2021-08-14 NOTE — Progress Notes (Signed)
Patient is being discharged home with son and HH. Son has arranged for transportation with a care giver who will pick patient up at 1300 today. Discharge instruction discussed with patient and son. A copy will be given to caregiver to take home with patient.

## 2021-08-16 ENCOUNTER — Ambulatory Visit: Payer: Medicare Other | Admitting: Dermatology

## 2021-08-19 ENCOUNTER — Other Ambulatory Visit (HOSPITAL_BASED_OUTPATIENT_CLINIC_OR_DEPARTMENT_OTHER): Payer: Self-pay | Admitting: Family Medicine

## 2021-08-19 ENCOUNTER — Other Ambulatory Visit: Payer: Self-pay | Admitting: Family Medicine

## 2021-08-19 DIAGNOSIS — I82531 Chronic embolism and thrombosis of right popliteal vein: Secondary | ICD-10-CM

## 2021-08-24 ENCOUNTER — Ambulatory Visit: Payer: Medicare Other

## 2021-08-31 ENCOUNTER — Ambulatory Visit
Admission: RE | Admit: 2021-08-31 | Discharge: 2021-08-31 | Disposition: A | Discharge status: Home / Self Care | Payer: Medicare Other | Source: Ambulatory Visit | Attending: Family Medicine | Admitting: Family Medicine

## 2021-08-31 ENCOUNTER — Other Ambulatory Visit: Payer: Self-pay

## 2021-08-31 DIAGNOSIS — I82531 Chronic embolism and thrombosis of right popliteal vein: Secondary | ICD-10-CM | POA: Diagnosis present

## 2021-12-29 ENCOUNTER — Ambulatory Visit (INDEPENDENT_AMBULATORY_CARE_PROVIDER_SITE_OTHER): Payer: Medicare Other | Admitting: Dermatology

## 2021-12-29 ENCOUNTER — Other Ambulatory Visit: Payer: Self-pay

## 2021-12-29 DIAGNOSIS — L820 Inflamed seborrheic keratosis: Secondary | ICD-10-CM | POA: Diagnosis not present

## 2021-12-29 DIAGNOSIS — L570 Actinic keratosis: Secondary | ICD-10-CM | POA: Diagnosis not present

## 2021-12-29 DIAGNOSIS — L578 Other skin changes due to chronic exposure to nonionizing radiation: Secondary | ICD-10-CM

## 2021-12-29 DIAGNOSIS — I8393 Asymptomatic varicose veins of bilateral lower extremities: Secondary | ICD-10-CM | POA: Diagnosis not present

## 2021-12-29 DIAGNOSIS — Z85820 Personal history of malignant melanoma of skin: Secondary | ICD-10-CM | POA: Diagnosis not present

## 2021-12-29 DIAGNOSIS — D229 Melanocytic nevi, unspecified: Secondary | ICD-10-CM

## 2021-12-29 DIAGNOSIS — L821 Other seborrheic keratosis: Secondary | ICD-10-CM

## 2021-12-29 DIAGNOSIS — L814 Other melanin hyperpigmentation: Secondary | ICD-10-CM

## 2021-12-29 NOTE — Patient Instructions (Signed)

## 2021-12-29 NOTE — Progress Notes (Signed)
Follow-Up Visit   Subjective  Krystal Richardson is a 88 y.o. female who presents for the following: Follow-up (Recheck ISK of left lower leg treated with LN2 04/06/2021) and Other (Spot on back - does not bother her, she just wants it checked). The patient's husband died recently and she is very sad and does not want to have a total-body skin exam today.  She has history of melanoma of the scalp The patient has spots, moles and lesions to be evaluated, some may be new or changing and the patient has concerns that these could be cancer.  Accompanied by daughter in law  The following portions of the chart were reviewed this encounter and updated as appropriate:   Tobacco   Allergies   Meds   Problems   Med Hx   Surg Hx   Fam Hx      Review of Systems:  No other skin or systemic complaints except as noted in HPI or Assessment and Plan.  Objective  Well appearing patient in no apparent distress; mood and affect are within normal limits.  A focused examination was performed including face, left leg, back. Relevant physical exam findings are noted in the Assessment and Plan.  lower legs Spider veins  Mid Back Stuck-on, waxy, tan-brown papule or plaque --Discussed benign etiology and prognosis.   Right Upper Back (2) Erythematous stuck-on, waxy papule or plaque  Right cheek (2) Erythematous thin papules/macules with gritty scale.    Assessment & Plan   Actinic Damage - chronic, secondary to cumulative UV radiation exposure/sun exposure over time - diffuse scaly erythematous macules with underlying dyspigmentation - Recommend daily broad spectrum sunscreen SPF 30+ to sun-exposed areas, reapply every 2 hours as needed.  - Recommend staying in the shade or wearing long sleeves, sun glasses (UVA+UVB protection) and wide brim hats (4-inch brim around the entire circumference of the hat). - Call for new or changing lesions.  History of Melanoma of the scalp - No evidence of recurrence  today - No lymphadenopathy - Recommend regular full body skin exams - Recommend daily broad spectrum sunscreen SPF 30+ to sun-exposed areas, reapply every 2 hours as needed.  - Call if any new or changing lesions are noted between office visits  Melanocytic Nevi - Tan-brown and/or pink-flesh-colored symmetric macules and papules - Benign appearing on exam today - Observation - Call clinic for new or changing moles - Recommend daily use of broad spectrum spf 30+ sunscreen to sun-exposed areas.   Lentigines - Scattered tan macules - Due to sun exposure - Benign-appering, observe - Recommend daily broad spectrum sunscreen SPF 30+ to sun-exposed areas, reapply every 2 hours as needed. - Call for any changes  Spider veins of both lower extremities lower legs Benign.  Seborrheic keratosis Mid Back Benign  Inflamed seborrheic keratosis (2) Right Upper Back  Destruction of lesion - Right Upper Back Complexity: simple   Destruction method: cryotherapy   Informed consent: discussed and consent obtained   Timeout:  patient name, date of birth, surgical site, and procedure verified Lesion destroyed using liquid nitrogen: Yes   Region frozen until ice ball extended beyond lesion: Yes   Outcome: patient tolerated procedure well with no complications   Post-procedure details: wound care instructions given    AK (actinic keratosis) (2) Right cheek  Destruction of lesion - Right cheek Complexity: simple   Destruction method: cryotherapy   Informed consent: discussed and consent obtained   Timeout:  patient name, date of birth,  surgical site, and procedure verified Lesion destroyed using liquid nitrogen: Yes   Region frozen until ice ball extended beyond lesion: Yes   Outcome: patient tolerated procedure well with no complications   Post-procedure details: wound care instructions given    Return in about 1 year (around 12/29/2022) for TBSE.  I, Ashok Cordia, CMA, am acting as  scribe for Sarina Ser, MD . Documentation: I have reviewed the above documentation for accuracy and completeness, and I agree with the above.  Sarina Ser, MD

## 2021-12-30 ENCOUNTER — Encounter: Payer: Self-pay | Admitting: Dermatology

## 2022-01-04 ENCOUNTER — Encounter: Payer: Self-pay | Admitting: Dermatology

## 2022-01-04 ENCOUNTER — Ambulatory Visit (INDEPENDENT_AMBULATORY_CARE_PROVIDER_SITE_OTHER): Payer: Medicare Other | Admitting: Dermatology

## 2022-01-04 ENCOUNTER — Other Ambulatory Visit: Payer: Self-pay

## 2022-01-04 DIAGNOSIS — Z85820 Personal history of malignant melanoma of skin: Secondary | ICD-10-CM | POA: Diagnosis not present

## 2022-01-04 DIAGNOSIS — L0100 Impetigo, unspecified: Secondary | ICD-10-CM | POA: Diagnosis not present

## 2022-01-04 DIAGNOSIS — L03116 Cellulitis of left lower limb: Secondary | ICD-10-CM

## 2022-01-04 MED ORDER — MUPIROCIN 2 % EX OINT: TOPICAL_OINTMENT | CUTANEOUS | 1 refills | Status: DC

## 2022-01-04 MED ORDER — CEPHALEXIN 500 MG PO CAPS
500.0000 mg | ORAL_CAPSULE | Freq: Two times a day (BID) | ORAL | 0 refills | Status: AC
Start: 2022-01-04 — End: 2022-01-14

## 2022-01-04 NOTE — Patient Instructions (Addendum)

## 2022-01-04 NOTE — Progress Notes (Signed)
° °  Follow-Up Visit   Subjective  Krystal Richardson is a 88 y.o. female who presents for the following: Skin Problem (Check left foot. Dur: ~5 days. Non tender. Does not bleed. Denies trauma to area. Has been doing salt soaks and using Polysporin. Darker in color now. Concerned could be spider bite. ).  Son Krystal Richardson) with patient.   The following portions of the chart were reviewed this encounter and updated as appropriate:  Tobacco   Allergies   Meds   Problems   Med Hx   Surg Hx   Fam Hx      Review of Systems: No other skin or systemic complaints except as noted in HPI or Assessment and Plan.  Objective  Well appearing patient in no apparent distress; mood and affect are within normal limits.  A focused examination was performed including face, left foot. Relevant physical exam findings are noted in the Assessment and Plan.  Left dorsal foot at L-4,5 metatarsals Erythematous plaque with mild crusting      Assessment & Plan  Cellulitis of left lower extremity Left dorsal foot at L-4,5 metatarsals  With impetigo  Communicate with son: Krystal Richardson, Krystal Richardson [phone]  Regarding C&S results.   Start Cephalexin 500mg  1 po bid for 10 days Mupirocin ointment QD-BID with bandage change  mupirocin ointment (BACTROBAN) 2 % - Left dorsal foot at L-4,5 metatarsals Apply once to twice daily with bandage change  cephALEXin (KEFLEX) 500 MG capsule - Left dorsal foot at L-4,5 metatarsals Take 1 capsule (500 mg total) by mouth 2 (two) times daily for 10 days.  Related Procedures Anaerobic and Aerobic Culture   Return for TBSE As Scheduled.  I, Emelia Salisbury, CMA, am acting as scribe for Sarina Ser, MD. Documentation: I have reviewed the above documentation for accuracy and completeness, and I agree with the above.  Sarina Ser, MD

## 2022-01-05 ENCOUNTER — Encounter: Payer: Self-pay | Admitting: Dermatology

## 2022-01-06 ENCOUNTER — Other Ambulatory Visit: Payer: Self-pay

## 2022-01-06 ENCOUNTER — Ambulatory Visit (INDEPENDENT_AMBULATORY_CARE_PROVIDER_SITE_OTHER): Payer: Medicare Other | Admitting: Vascular Surgery

## 2022-01-06 ENCOUNTER — Encounter (INDEPENDENT_AMBULATORY_CARE_PROVIDER_SITE_OTHER): Payer: Self-pay | Admitting: Vascular Surgery

## 2022-01-06 VITALS — BP 199/80 | HR 71 | Resp 15 | Wt 102.8 lb

## 2022-01-06 DIAGNOSIS — E039 Hypothyroidism, unspecified: Secondary | ICD-10-CM | POA: Diagnosis not present

## 2022-01-06 DIAGNOSIS — I82531 Chronic embolism and thrombosis of right popliteal vein: Secondary | ICD-10-CM | POA: Diagnosis not present

## 2022-01-06 DIAGNOSIS — K219 Gastro-esophageal reflux disease without esophagitis: Secondary | ICD-10-CM | POA: Diagnosis not present

## 2022-01-06 DIAGNOSIS — E782 Mixed hyperlipidemia: Secondary | ICD-10-CM | POA: Diagnosis not present

## 2022-01-06 DIAGNOSIS — H908 Mixed conductive and sensorineural hearing loss, unspecified: Secondary | ICD-10-CM | POA: Insufficient documentation

## 2022-01-07 ENCOUNTER — Encounter (INDEPENDENT_AMBULATORY_CARE_PROVIDER_SITE_OTHER): Payer: Self-pay | Admitting: Vascular Surgery

## 2022-01-07 DIAGNOSIS — I82509 Chronic embolism and thrombosis of unspecified deep veins of unspecified lower extremity: Secondary | ICD-10-CM | POA: Insufficient documentation

## 2022-01-07 DIAGNOSIS — K219 Gastro-esophageal reflux disease without esophagitis: Secondary | ICD-10-CM | POA: Insufficient documentation

## 2022-01-07 NOTE — Progress Notes (Signed)
MRN : [id number]  Krystal Richardson is a 88 y.o. (May 08, 1929) female who presents with chief complaint of check the blood clot in my leg.  History of Present Illness:   The patient presents to the office for evaluation of DVT.  DVT was identified years ago.  The initial symptoms were pain and swelling in the lower extremity.  Recently she had a duplex ultrasound which showed chronic nonocclusive changes in the right popliteal vein.  The patient notes her right leg is not very painful and does not swell tremendously with dependency.  Symptoms are better with elevation.  The patient notes minimal edema in the morning which steadily worsens throughout the day.  Her 1 concern is that her feet become very blue when she sits in a dependent position for long periods of time  The patient has not been using compression therapy at this point.  No SOB or pleuritic chest pains.  No cough or hemoptysis.  No blood per rectum or blood in any sputum.  No excessive bruising per the patient.    Current Meds  Medication Sig   aspirin EC 81 MG tablet Take 1 tablet (81 mg total) by mouth daily.   cephALEXin (KEFLEX) 500 MG capsule Take 1 capsule (500 mg total) by mouth 2 (two) times daily for 10 days.   DULoxetine (CYMBALTA) 30 MG capsule Take 1 capsule (30 mg total) by mouth daily.   feeding supplement (ENSURE ENLIVE / ENSURE PLUS) LIQD Take 237 mLs by mouth 2 (two) times daily between meals.   levothyroxine (SYNTHROID, LEVOTHROID) 25 MCG tablet Take 25 mcg by mouth daily before breakfast.   mirtazapine (REMERON SOL-TAB) 30 MG disintegrating tablet Take 1 tablet (30 mg total) by mouth at bedtime.    Past Medical History:  Diagnosis Date   Actinic keratosis 03/12/2019   mid post scalp melanoma graft scar   Basal cell carcinoma 08/04/2014   R forehead - excision 08/19/2014   BCC (basal cell carcinoma of skin) 02/20/2019   L dorsum foot   Hyperlipidemia    Melanoma ([facility]) 2002   with chemo and rad tx    Personal history of chemotherapy 2003   MELANOMA   Personal history of radiation therapy 2002   MELANOMA   Stomach ulcer    Urethral caruncle     Past Surgical History:  Procedure Laterality Date   BREAST EXCISIONAL BIOPSY Left 1986   EXCISIONAL - NEG   cataract     hemmriodectomy  1980   melanoma  2002    Social History Social History   Tobacco Use   Smoking status: Never   Smokeless tobacco: Never  Substance Use Topics   Alcohol use: No    Alcohol/week: 0.0 standard drinks   Drug use: No    Family History Family History  Problem Relation Age of Onset   Kidney disease Neg Hx    Bladder Cancer Neg Hx    Prostate cancer Neg Hx    Breast cancer Neg Hx     Allergies  Allergen Reactions   Toprol Xl [Metoprolol Tartrate]      REVIEW OF SYSTEMS (Negative unless checked)  Constitutional: [] Weight loss  [] Fever  [] Chills Cardiac: [] Chest pain   [] Chest pressure   [] Palpitations   [] Shortness of breath when laying flat   [] Shortness of breath with exertion. Vascular:  [] Pain in legs with walking   [] Pain in legs at rest  [x] History of DVT   [] Phlebitis   [x] Swelling in  legs   [] Varicose veins   [] Non-healing ulcers Pulmonary:   [] Uses home oxygen   [] Productive cough   [] Hemoptysis   [] Wheeze  [] COPD   [] Asthma Neurologic:  [] Dizziness   [] Seizures   [] History of stroke   [] History of TIA  [] Aphasia   [] Vissual changes   [] Weakness or numbness in arm   [] Weakness or numbness in leg Musculoskeletal:   [] Joint swelling   [] Joint pain   [] Low back pain Hematologic:  [] Easy bruising  [] Easy bleeding   [] Hypercoagulable state   [] Anemic Gastrointestinal:  [] Diarrhea   [] Vomiting  [x] Gastroesophageal reflux/heartburn   [] Difficulty swallowing. Genitourinary:  [] Chronic kidney disease   [] Difficult urination  [] Frequent urination   [] Blood in urine Skin:  [] Rashes   [] Ulcers  Psychological:  [] History of anxiety   []  History of major depression.  Physical  Examination  Vitals:   01/06/22 1059  BP: (!) 199/80  Pulse: 71  Resp: 15  Weight: 102 lb 12.8 oz (46.6 kg)   Body mass index is 18.21 kg/m. Gen: WD/WN, NAD Head: [place]/AT, No temporalis wasting.  Ear/Nose/Throat: Hearing grossly intact, nares w/o erythema or drainage, pinna without lesions Eyes: PER, EOMI, sclera nonicteric.  Neck: Supple, no gross masses.  No JVD.  Pulmonary:  Good air movement, no audible wheezing, no use of accessory muscles.  Cardiac: RRR, precordium not hyperdynamic. Vascular:  scattered varicosities present bilaterally.  Moderate to severe venous stasis changes to the legs bilaterally right more affected than the left.  2+ soft pitting edema  Vessel Right Left  Radial Palpable Palpable  Gastrointestinal: soft, non-distended. No guarding/no peritoneal signs.  Musculoskeletal: M/S 5/5 throughout.  No deformity.  Neurologic: CN 2-12 intact. Pain and light touch intact in extremities.  Symmetrical.  Speech is fluent. Motor exam as listed above. Psychiatric: Judgment intact, Mood & affect appropriate for pt's clinical situation. Dermatologic: Venous rashes no ulcers noted.  No changes consistent with cellulitis. Lymph : No lichenification or skin changes of chronic lymphedema.  CBC Lab Results  Component Value Date   WBC 4.0 08/14/2021   HGB 9.1 (L) 08/14/2021   HCT 25.8 (L) 08/14/2021   MCV 89.3 08/14/2021   PLT 205 08/14/2021    BMET    Component Value Date/Time   NA 134 (L) 08/14/2021 0506   NA 139 09/10/2013 0341   K 3.2 (L) 08/14/2021 0506   K 4.0 09/10/2013 0341   CL 107 08/14/2021 0506   CL 112 (H) 09/10/2013 0341   CO2 22 08/14/2021 0506   CO2 23 09/10/2013 0341   GLUCOSE 83 08/14/2021 0506   GLUCOSE 94 09/10/2013 0341   BUN 15 08/14/2021 0506   BUN 11 09/10/2013 0341   CREATININE 0.49 08/14/2021 0506   CREATININE 0.64 09/10/2013 0341   CALCIUM 8.2 (L) 08/14/2021 0506   CALCIUM 8.6 09/10/2013 0341   GFRNONAA >60 08/14/2021 0506    GFRNONAA >60 09/10/2013 0341   GFRAA >60 09/10/2013 0341   CrCl cannot be calculated (Patient's most recent lab result is older than the maximum 21 days allowed.).  COAG No results found for: INR, PROTIME  Radiology No results found.   Assessment/Plan 1. Chronic deep vein thrombosis (DVT) of popliteal vein of right lower extremity (HCC) No surgery or intervention at this point in time.  I have have discussed venous insufficiency and why it causes symptoms with the patient. I have discussed with the patient the chronic skin changes that accompany venous insufficiency and the long term sequela such as  ulceration. Patient will begin wearing graduated compression stockings on a daily basis, as this has provided excellent control of his edema. The patient will put the stockings on first thing in the morning and removing them in the evening. The patient is reminded not to sleep in the stockings.  In addition, behavioral modification including elevation during the day will be initiated.  Exercise is strongly encouraged.  Duplex ultrasound of the right lower extremity shows chronic changes in the right popliteal vein otherwise fairly normal exam for the deep venous system.  No superficial reflux identified  Given the patient's good control and lack of any problems regarding the venous insufficiency and lymphedema a lymph pump in not need at this time.  The patient will follow up with me PRN should anything change.  The patient voices agreement with this plan.   2. Mixed hyperlipidemia Continue statin as ordered and reviewed, no changes at this time   3. Hypothyroidism, unspecified type Continue hormone replacement as ordered and reviewed, no changes at this time   4. Gastroesophageal reflux disease without esophagitis Continue PPI as already ordered, this medication has been reviewed and there are no changes at this time.  Avoidence of caffeine and alcohol  Moderate elevation of the head  of the bed     Hortencia Pilar, MD  01/07/2022 1:09 PM

## 2022-01-12 LAB — ANAEROBIC AND AEROBIC CULTURE

## 2022-01-13 ENCOUNTER — Telehealth: Payer: Self-pay

## 2022-01-13 NOTE — Telephone Encounter (Signed)
-----   Message from Ralene Bathe, MD sent at 01/12/2022  5:40 PM EST ----- Culture from 01/04/2022 from the Right foot showed No abnormal bacterial growth. How is the area of foot doing? No change in treatment - finish current regimen

## 2022-01-13 NOTE — Telephone Encounter (Signed)
Patient's son Krystal Richardson advised of culture results. He states the foot is improving and starting to look better. Advised son if the foot becomes bothersome, call our office so we can schedule appointment.

## 2022-09-24 IMAGING — DX DG CHEST 1V PORT
1 series · 1 of 1 positions shown · non-contrast
Comparison: Radiograph 07/18/2021

CLINICAL DATA: COVID positive

EXAM:
PORTABLE CHEST 1 VIEW

[chest ap]
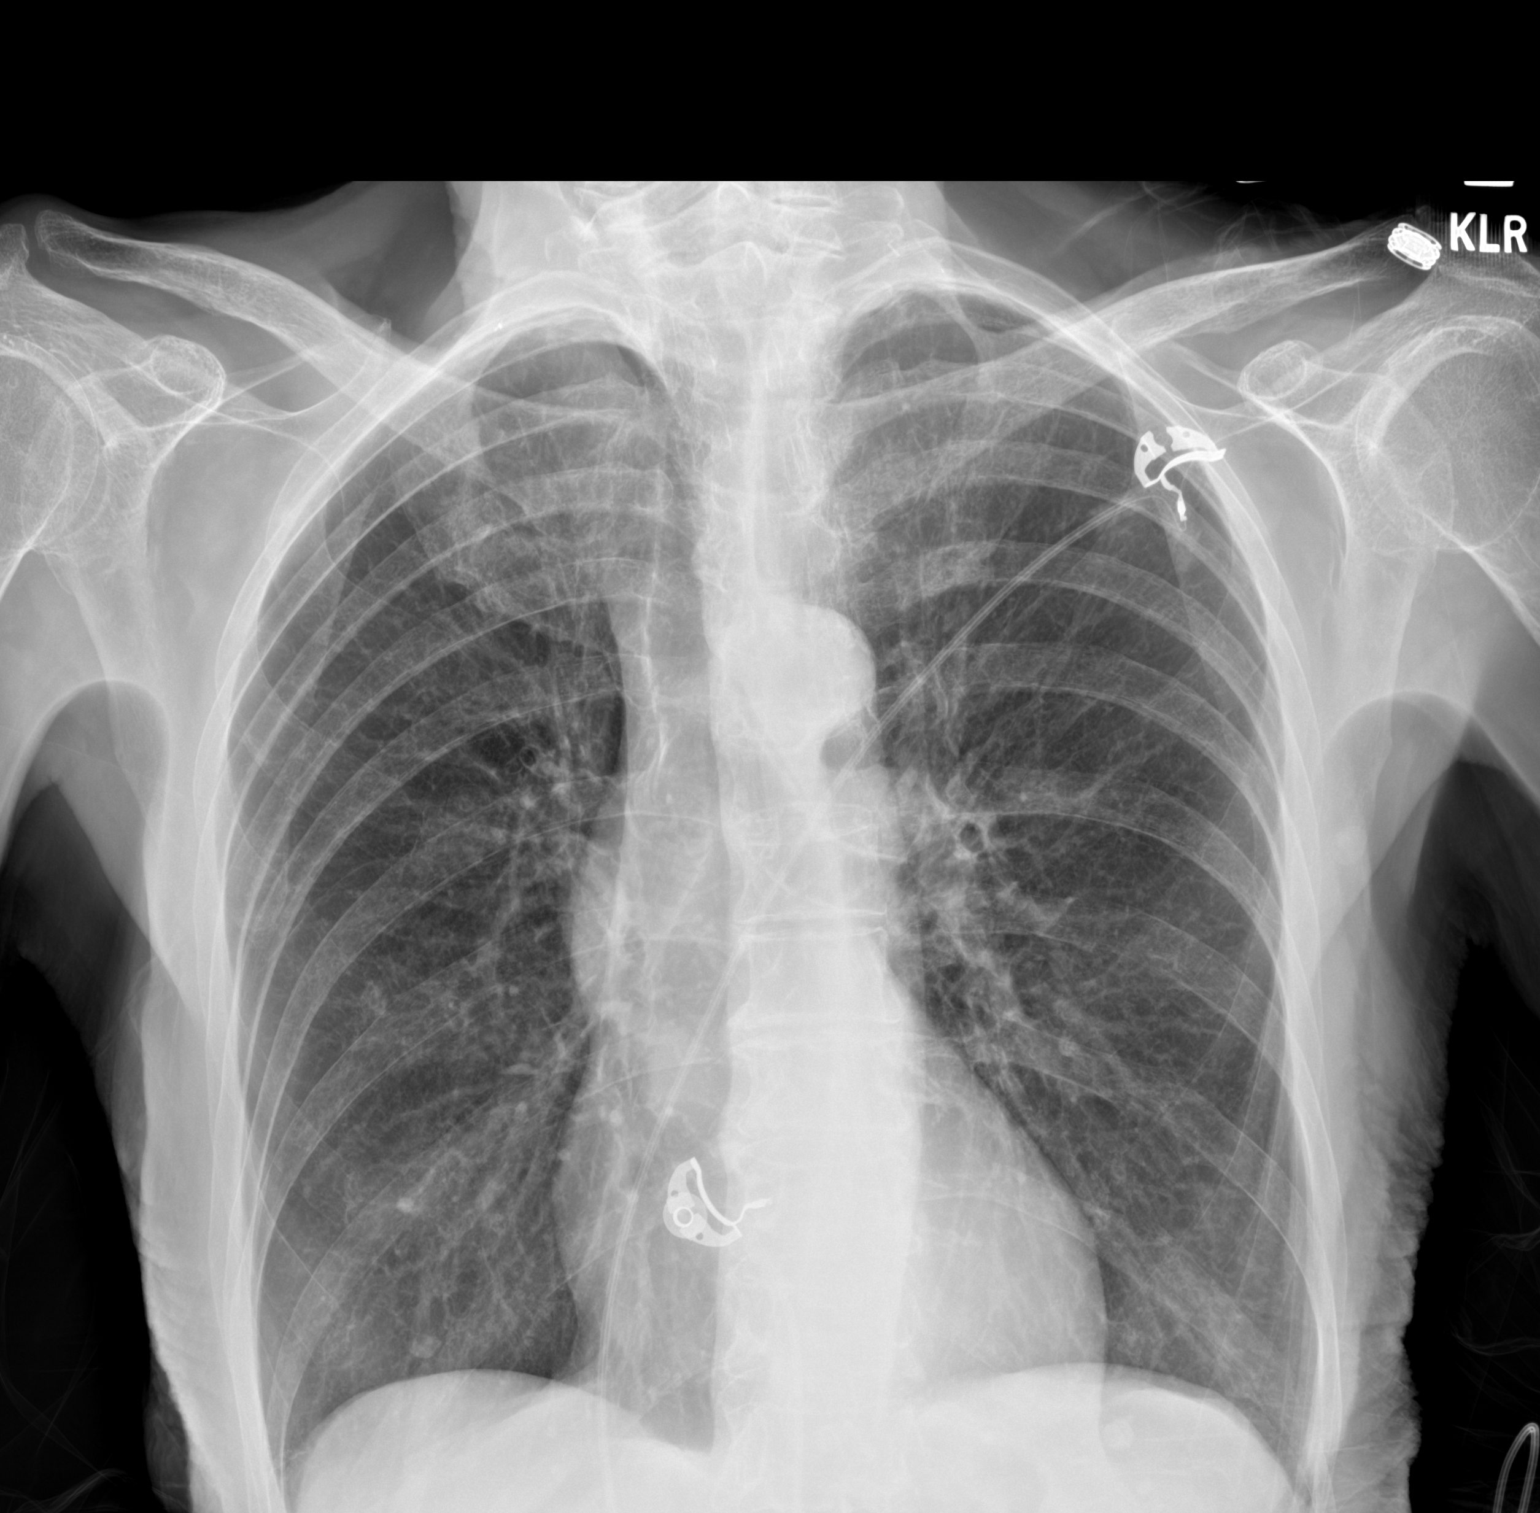

[1 of 1 positions shown; findings below may reference images not displayed]

FINDINGS: Unchanged cardiomediastinal silhouette. Pulmonary hyperinflation,
unchanged. There is no new focal airspace disease. No large pleural
effusion or visible pneumothorax. No acute osseous abnormality.
Thoracic spondylosis.
IMPRESSION: No new focal airspace disease.  Unchanged pulmonary hyperinflation.

## 2023-01-02 ENCOUNTER — Ambulatory Visit: Payer: Medicare Other | Admitting: Dermatology

## 2023-07-03 ENCOUNTER — Ambulatory Visit (INDEPENDENT_AMBULATORY_CARE_PROVIDER_SITE_OTHER): Payer: Medicare Other | Admitting: Dermatology

## 2023-07-03 VITALS — BP 142/74

## 2023-07-03 DIAGNOSIS — Z85820 Personal history of malignant melanoma of skin: Secondary | ICD-10-CM

## 2023-07-03 DIAGNOSIS — L304 Erythema intertrigo: Secondary | ICD-10-CM

## 2023-07-03 DIAGNOSIS — L98499 Non-pressure chronic ulcer of skin of other sites with unspecified severity: Secondary | ICD-10-CM

## 2023-07-03 DIAGNOSIS — D492 Neoplasm of unspecified behavior of bone, soft tissue, and skin: Secondary | ICD-10-CM

## 2023-07-03 MED ORDER — MUPIROCIN 2 % EX OINT
1.0000 | TOPICAL_OINTMENT | Freq: Every day | CUTANEOUS | 1 refills | Status: DC
Start: 2023-07-03 — End: 2023-12-26

## 2023-07-03 MED ORDER — DOXYCYCLINE MONOHYDRATE 100 MG PO CAPS: 100.0000 mg | ORAL_CAPSULE | Freq: Two times a day (BID) | ORAL | 0 refills | Status: AC

## 2023-07-03 NOTE — Progress Notes (Signed)
   Follow-Up Visit   Subjective  Krystal Richardson is a 89 y.o. female who presents for the following: check spot scalp, hx of rash axilla that she has used Nystatin and TMC cream on and her provider recommended using the creams on scalp, itchy  Patient accompanied by caregiver.  The following portions of the chart were reviewed this encounter and updated as appropriate: medications, allergies, medical history  Review of Systems:  No other skin or systemic complaints except as noted in HPI or Assessment and Plan.  Objective  Well appearing patient in no apparent distress; mood and affect are within normal limits.   A focused examination was performed of the following areas: scalp  Relevant exam findings are noted in the Assessment and Plan.  mid crown 4.0 x 2.5cm crusted ulceration mid crown with rolled edge       Assessment & Plan     Neoplasm of skin mid crown  Skin / nail biopsy Type of biopsy: tangential   Informed consent: discussed and consent obtained   Anesthesia: the lesion was anesthetized in a standard fashion   Anesthesia comment:  Area prepped with alcohol Anesthetic:  1% lidocaine w/ epinephrine 1-100,000 buffered w/ 8.4% NaHCO3 Instrument used: flexible razor blade   Hemostasis achieved with: pressure, aluminum chloride and electrodesiccation   Outcome: patient tolerated procedure well   Post-procedure details: wound care instructions given   Post-procedure details comment:  Ointment and small bandage applied  Aerobic culture  mupirocin ointment (BACTROBAN) 2 % Apply 1 Application topically daily. Qd to wound on scalp  Specimen 1 - Surgical pathology Differential Diagnosis: D48.5 Infection r/o BCC, SCC, Radiation Dermatitis, recurrent Melanoma, Pyoderma gangrenosum  Check Margins: No 4.0 x 2.5cm crusted ulceration mid crown Hx of melanoma crown scalp 2002 with chemo, radiation and graft  Pt has h/o melanoma crown scalp over 20 yrs ago treated  with excision with graft, chemo, and radiation.  Start Doxycycline 100mg  1 po bid with food and drink for 2 weeks Start Mupirocin oint every day to wound on scalp  Doxycycline should be taken with food to prevent nausea. Do not lay down for 30 minutes after taking. Be cautious with sun exposure and use good sun protection while on this medication. Pregnant women should not take this medication.     INTERTRIGO Exam: light pink papules axilla  Chronic and persistent condition with duration or expected duration over one year. Condition is symptomatic/ bothersome to patient. Not currently at goal.   Intertrigo is a chronic recurrent rash that occurs in skin fold areas that may be associated with friction; heat; moisture; yeast; fungus; and bacteria.  It is exacerbated by increased movement / activity; sweating; and higher atmospheric temperature.  Treatment Plan Cont Nystatin cream bid D/c TMC  Start Zeasorb AF to axilla  Return if symptoms worsen or fail to improve, for pending biopsy.  I, Ardis Rowan, RMA, am acting as scribe for Willeen Niece, MD .   Documentation: I have reviewed the above documentation for accuracy and completeness, and I agree with the above.  Willeen Niece, MD

## 2023-07-03 NOTE — Patient Instructions (Addendum)
Recommend [facility] Hypochlorous Spray (found in the wound care section). The [facility] Hypochlorous Spray can be sprayed on wound on scalp daily and left on.   Apply Mupirocin ointment to wound once daily (sent to pharmacy) Start Doxycycline 100mg  1 pill twice a day with food and drink for 2 weeks  Recommend OTC Zeasorb AF powder to body folds daily after shower.  It is often found in the athlete's foot section in the pharmacy.  Avoid using powders that contain cornstarch.    Due to recent changes in healthcare laws, you may see results of your pathology and/or laboratory studies on MyChart before the doctors have had a chance to review them. We understand that in some cases there may be results that are confusing or concerning to you. Please understand that not all results are received at the same time and often the doctors may need to interpret multiple results in order to provide you with the best plan of care or course of treatment. Therefore, we ask that you please give [place] 2 business days to thoroughly review all your results before contacting the office for clarification. Should we see a critical lab result, you will be contacted sooner.   If You Need Anything After Your Visit  If you have any questions or concerns for your doctor, please call our main line at [phone] and press option 4 to reach your doctor's medical assistant. If no one answers, please leave a voicemail as directed and we will return your call as soon as possible. Messages left after 4 pm will be answered the following business day.   You may also send [place] a message via MyChart. We typically respond to MyChart messages within 1-2 business days.  For prescription refills, please ask your pharmacy to contact our office. Our fax number is [phone].  If you have an urgent issue when the clinic is closed that cannot wait until the next business day, you can page your doctor at the number below.    Please note that  while we do our best to be available for urgent issues outside of office hours, we are not available 24/7.   If you have an urgent issue and are unable to reach [place], you may choose to seek medical care at your doctor's office, retail clinic, urgent care center, or emergency room.  If you have a medical emergency, please immediately call 911 or go to the emergency department.  Pager Numbers  - Dr. Gwen Pounds: [phone]  - Dr. Neale Burly: [phone]  - Dr. Roseanne Reno: [phone]  In the event of inclement weather, please call our main line at [phone] for an update on the status of any delays or closures.  Dermatology Medication Tips: Please keep the boxes that topical medications come in in order to help keep track of the instructions about where and how to use these. Pharmacies typically print the medication instructions only on the boxes and not directly on the medication tubes.   If your medication is too expensive, please contact our office at [phone] option 4 or send [place] a message through MyChart.   We are unable to tell what your co-pay for medications will be in advance as this is different depending on your insurance coverage. However, we may be able to find a substitute medication at lower cost or fill out paperwork to get insurance to cover a needed medication.   If a prior authorization is required to get your medication covered by your insurance company, please allow [place]  1-2 business days to complete this process.  Drug prices often vary depending on where the prescription is filled and some pharmacies may offer cheaper prices.  The website www.goodrx.com contains coupons for medications through different pharmacies. The prices here do not account for what the cost may be with help from insurance (it may be cheaper with your insurance), but the website can give you the price if you did not use any insurance.  - You can print the associated coupon and take it with your  prescription to the pharmacy.  - You may also stop by our office during regular business hours and pick up a GoodRx coupon card.  - If you need your prescription sent electronically to a different pharmacy, notify our office through [facility] or by phone at [phone] option 4.     Si Usted Necesita Algo Despus de Su Visita  Tambin puede enviarnos un mensaje a travs de Clinical cytogeneticist. Por lo general respondemos a los mensajes de MyChart en el transcurso de 1 a 2 das hbiles.  Para renovar recetas, por favor pida a su farmacia que se ponga en contacto con nuestra oficina. Annie Sable de fax es [place] [phone].  Si tiene un asunto urgente cuando la clnica est cerrada y que no puede esperar hasta el siguiente da hbil, puede llamar/localizar a su doctor(a) al nmero que aparece a continuacin.   Por favor, tenga en cuenta que aunque hacemos todo lo posible para estar disponibles para asuntos urgentes fuera del horario de [place], no estamos disponibles las 24 horas del da, los 7 [address]  [address] de la [place].   Si tiene un problema urgente y no puede comunicarse con nosotros, puede optar por buscar atencin mdica  en el consultorio de su doctor(a), en una clnica privada, en un centro de atencin urgente o en una sala de emergencias.  Si tiene Engineer, drilling, por favor llame inmediatamente al 911 o vaya a la sala de emergencias.  Nmeros de bper  - Dr. Gwen Pounds: [phone]  - Dra. Moye: [phone]  - Dra. Roseanne Reno: [phone]  En caso de inclemencias del [place], por favor llame a Lacy Duverney principal al [phone] para una actualizacin sobre el [place] de cualquier retraso o cierre.  Consejos para la medicacin en dermatologa: Por favor, guarde las cajas en las que vienen los medicamentos de uso tpico para ayudarle a seguir las instrucciones sobre dnde y cmo usarlos. Las farmacias generalmente imprimen las instrucciones del medicamento slo en las cajas y no  directamente en los tubos del [place].   Si su medicamento es muy caro, por favor, pngase en contacto con Rolm Gala llamando al [phone] y presione la opcin 4 o envenos un mensaje a travs de Clinical cytogeneticist.   No podemos decirle cul ser su copago por los medicamentos por adelantado ya que esto es diferente dependiendo de la cobertura de su seguro. Sin embargo, es posible que podamos encontrar un medicamento sustituto a Audiological scientist un formulario para que el seguro cubra el medicamento que se considera necesario.   Si se requiere una autorizacin previa para que su compaa de seguros [place] su medicamento, por favor permtanos de 1 a 2 das hbiles para completar [address]  Los precios de los medicamentos varan con frecuencia dependiendo del Environmental consultant de dnde se surte la receta y alguna farmacias pueden ofrecer precios ms baratos.  El sitio web www.goodrx.com tiene cupones para medicamentos de Health and safety inspector. Los precios aqu no tienen en cuenta lo que podra costar  con la ayuda del seguro (puede ser ms barato con su seguro), pero el sitio web puede darle el precio si no Visual merchandiser.  - Puede imprimir el cupn correspondiente y llevarlo con su receta a la farmacia.  - Tambin puede pasar por nuestra oficina durante el horario de atencin regular y Education officer, museum una tarjeta de cupones de GoodRx.  - Si necesita que su receta se enve electrnicamente a una farmacia diferente, informe a nuestra oficina a travs de MyChart de [place] o por telfono llamando al [phone] y presione la opcin 4.

## 2023-07-06 LAB — AEROBIC CULTURE

## 2023-07-10 ENCOUNTER — Telehealth: Payer: Self-pay

## 2023-07-10 NOTE — Telephone Encounter (Signed)
Bernerd Limbo, patients' care giver, of culture results.  Advised to continue Doxycycline as prescribed and to cont Mupirocin oint qd to wound with dressing changes.

## 2023-07-10 NOTE — Telephone Encounter (Signed)
-----   Message from Willeen Niece sent at 07/10/2023  1:36 PM EDT ----- Positive for staph aureus.  Sensitive to doxy, pt is currently taking.  Continue as prescribed and continue mupirocin ointment with daily dressing changes- please call patient

## 2023-07-11 NOTE — Telephone Encounter (Signed)
Krystal Richardson, caregiver, of bx result./sh

## 2023-07-11 NOTE — Telephone Encounter (Signed)
Left Krystal Richardson message to call for pathology results.  She has already been advised of culture results./sh

## 2023-12-15 ENCOUNTER — Other Ambulatory Visit: Payer: Self-pay

## 2023-12-15 ENCOUNTER — Emergency Department: Payer: Medicare Other

## 2023-12-15 ENCOUNTER — Inpatient Hospital Stay
Admission: EM | Admit: 2023-12-15 | Discharge: 2023-12-26 | DRG: 640 | Disposition: A | Discharge status: Hospice | Payer: Medicare Other | Attending: Internal Medicine | Admitting: Internal Medicine

## 2023-12-15 DIAGNOSIS — Z974 Presence of external hearing-aid: Secondary | ICD-10-CM

## 2023-12-15 DIAGNOSIS — Z681 Body mass index (BMI) 19 or less, adult: Secondary | ICD-10-CM

## 2023-12-15 DIAGNOSIS — W19XXXA Unspecified fall, initial encounter: Principal | ICD-10-CM

## 2023-12-15 DIAGNOSIS — I959 Hypotension, unspecified: Secondary | ICD-10-CM | POA: Diagnosis present

## 2023-12-15 DIAGNOSIS — Z85820 Personal history of malignant melanoma of skin: Secondary | ICD-10-CM

## 2023-12-15 DIAGNOSIS — Z9221 Personal history of antineoplastic chemotherapy: Secondary | ICD-10-CM

## 2023-12-15 DIAGNOSIS — N390 Urinary tract infection, site not specified: Secondary | ICD-10-CM | POA: Diagnosis present

## 2023-12-15 DIAGNOSIS — H903 Sensorineural hearing loss, bilateral: Secondary | ICD-10-CM | POA: Diagnosis present

## 2023-12-15 DIAGNOSIS — Z66 Do not resuscitate: Secondary | ICD-10-CM | POA: Diagnosis present

## 2023-12-15 DIAGNOSIS — E861 Hypovolemia: Secondary | ICD-10-CM | POA: Diagnosis present

## 2023-12-15 DIAGNOSIS — E785 Hyperlipidemia, unspecified: Secondary | ICD-10-CM | POA: Diagnosis present

## 2023-12-15 DIAGNOSIS — K219 Gastro-esophageal reflux disease without esophagitis: Secondary | ICD-10-CM | POA: Diagnosis present

## 2023-12-15 DIAGNOSIS — R55 Syncope and collapse: Secondary | ICD-10-CM | POA: Diagnosis not present

## 2023-12-15 DIAGNOSIS — R636 Underweight: Secondary | ICD-10-CM | POA: Diagnosis present

## 2023-12-15 DIAGNOSIS — F32A Depression, unspecified: Secondary | ICD-10-CM | POA: Diagnosis present

## 2023-12-15 DIAGNOSIS — E860 Dehydration: Principal | ICD-10-CM | POA: Insufficient documentation

## 2023-12-15 DIAGNOSIS — Z9181 History of falling: Secondary | ICD-10-CM

## 2023-12-15 DIAGNOSIS — A419 Sepsis, unspecified organism: Secondary | ICD-10-CM | POA: Diagnosis not present

## 2023-12-15 DIAGNOSIS — R42 Dizziness and giddiness: Secondary | ICD-10-CM

## 2023-12-15 DIAGNOSIS — J9601 Acute respiratory failure with hypoxia: Secondary | ICD-10-CM | POA: Diagnosis not present

## 2023-12-15 DIAGNOSIS — Z79890 Hormone replacement therapy (postmenopausal): Secondary | ICD-10-CM

## 2023-12-15 DIAGNOSIS — B9620 Unspecified Escherichia coli [E. coli] as the cause of diseases classified elsewhere: Secondary | ICD-10-CM | POA: Diagnosis present

## 2023-12-15 DIAGNOSIS — E039 Hypothyroidism, unspecified: Secondary | ICD-10-CM | POA: Diagnosis present

## 2023-12-15 DIAGNOSIS — E872 Acidosis, unspecified: Secondary | ICD-10-CM | POA: Diagnosis not present

## 2023-12-15 DIAGNOSIS — E871 Hypo-osmolality and hyponatremia: Secondary | ICD-10-CM | POA: Diagnosis present

## 2023-12-15 DIAGNOSIS — I5031 Acute diastolic (congestive) heart failure: Secondary | ICD-10-CM | POA: Diagnosis not present

## 2023-12-15 DIAGNOSIS — Z7982 Long term (current) use of aspirin: Secondary | ICD-10-CM

## 2023-12-15 DIAGNOSIS — Z79899 Other long term (current) drug therapy: Secondary | ICD-10-CM

## 2023-12-15 DIAGNOSIS — E639 Nutritional deficiency, unspecified: Secondary | ICD-10-CM | POA: Diagnosis present

## 2023-12-15 DIAGNOSIS — Z602 Problems related to living alone: Secondary | ICD-10-CM | POA: Diagnosis present

## 2023-12-15 DIAGNOSIS — E876 Hypokalemia: Secondary | ICD-10-CM | POA: Diagnosis not present

## 2023-12-15 DIAGNOSIS — E8770 Fluid overload, unspecified: Secondary | ICD-10-CM | POA: Diagnosis not present

## 2023-12-15 DIAGNOSIS — R54 Age-related physical debility: Secondary | ICD-10-CM | POA: Diagnosis present

## 2023-12-15 DIAGNOSIS — Z923 Personal history of irradiation: Secondary | ICD-10-CM

## 2023-12-15 LAB — LACTIC ACID, PLASMA
Lactic Acid, Venous: 3.4 mmol/L (ref 0.5–1.9)
Lactic Acid, Venous: 2.3 mmol/L (ref 0.5–1.9)

## 2023-12-15 LAB — CBC WITH DIFFERENTIAL/PLATELET
Abs Immature Granulocytes: 0.04 10*3/uL (ref 0.00–0.07)
Basophils Absolute: 0 10*3/uL (ref 0.0–0.1)
Basophils Relative: 0 %
Eosinophils Absolute: 0 10*3/uL (ref 0.0–0.5)
Eosinophils Relative: 0 %
HCT: 37.6 % (ref 36.0–46.0)
Hemoglobin: 13.1 g/dL (ref 12.0–15.0)
Immature Granulocytes: 1 %
Lymphocytes Relative: 10 %
Lymphs Abs: 0.7 10*3/uL (ref 0.7–4.0)
MCH: 30.5 pg (ref 26.0–34.0)
MCHC: 34.8 g/dL (ref 30.0–36.0)
MCV: 87.4 fL (ref 80.0–100.0)
Monocytes Absolute: 0.4 10*3/uL (ref 0.1–1.0)
Monocytes Relative: 6 %
Neutro Abs: 5.6 10*3/uL (ref 1.7–7.7)
Neutrophils Relative %: 83 %
Platelets: 231 10*3/uL (ref 150–400)
RBC: 4.3 MIL/uL (ref 3.87–5.11)
RDW: 13.9 % (ref 11.5–15.5)
WBC: 6.7 10*3/uL (ref 4.0–10.5)
nRBC: 0 % (ref 0.0–0.2)

## 2023-12-15 LAB — COMPREHENSIVE METABOLIC PANEL
ALT: 15 U/L (ref 0–44)
AST: 29 U/L (ref 15–41)
Albumin: 4.1 g/dL (ref 3.5–5.0)
Alkaline Phosphatase: 64 U/L (ref 38–126)
Anion gap: 12 (ref 5–15)
BUN: 15 mg/dL (ref 8–23)
CO2: 18 mmol/L — ABNORMAL LOW (ref 22–32)
Calcium: 9.1 mg/dL (ref 8.9–10.3)
Chloride: 97 mmol/L — ABNORMAL LOW (ref 98–111)
Creatinine, Ser: 0.84 mg/dL (ref 0.44–1.00)
GFR, Estimated: >60 mL/min (ref >60)
Glucose, Bld: 158 mg/dL — ABNORMAL HIGH (ref 70–99)
Potassium: 3.6 mmol/L (ref 3.5–5.1)
Sodium: 127 mmol/L — ABNORMAL LOW (ref 135–145)
Total Bilirubin: 0.8 mg/dL (ref <1.2)
Total Protein: 7.3 g/dL (ref 6.5–8.1)

## 2023-12-15 LAB — URINALYSIS, ROUTINE W REFLEX MICROSCOPIC
Bilirubin Urine: NEGATIVE
Glucose, UA: 50 mg/dL — AB
Hgb urine dipstick: NEGATIVE
Ketones, ur: 20 mg/dL — AB
Nitrite: NEGATIVE
Protein, ur: NEGATIVE mg/dL
Specific Gravity, Urine: 1.009 (ref 1.005–1.030)
pH: 8 (ref 5.0–8.0)

## 2023-12-15 LAB — TSH: TSH: 5.48 u[IU]/mL — ABNORMAL HIGH (ref 0.350–4.500)

## 2023-12-15 LAB — OSMOLALITY, URINE: Osmolality, Ur: 437 mosm/kg (ref 300–900)

## 2023-12-15 LAB — SODIUM, URINE, RANDOM: Sodium, Ur: 135 mmol/L

## 2023-12-15 LAB — TROPONIN I (HIGH SENSITIVITY)
Troponin I (High Sensitivity): 19 ng/L — ABNORMAL HIGH (ref <18)
Troponin I (High Sensitivity): 19 ng/L — ABNORMAL HIGH (ref <18)

## 2023-12-15 LAB — OSMOLALITY: Osmolality: 281 mosm/kg (ref 275–295)

## 2023-12-15 LAB — LIPASE, BLOOD: Lipase: 29 U/L (ref 11–51)

## 2023-12-15 MED ORDER — LEVOTHYROXINE SODIUM 25 MCG PO TABS
25.0000 ug | ORAL_TABLET | Freq: Every day | ORAL | Status: DC
  Administered 2023-12-16 – 2023-12-26 (×11): 25 ug via ORAL
  Filled 2023-12-15 (×10): qty 1

## 2023-12-15 MED ORDER — SODIUM CHLORIDE 0.9 % IV BOLUS
1000.0000 mL | Freq: Once | INTRAVENOUS | Status: AC
  Administered 2023-12-15: 1000 mL via INTRAVENOUS

## 2023-12-15 MED ORDER — DULOXETINE HCL 30 MG PO CPEP
30.0000 mg | ORAL_CAPSULE | Freq: Every day | ORAL | Status: DC
  Administered 2023-12-15 – 2023-12-26 (×12): 30 mg via ORAL
  Filled 2023-12-15 (×12): qty 1

## 2023-12-15 MED ORDER — SODIUM CHLORIDE 0.9 % IV SOLN
2.0000 g | Freq: Once | INTRAVENOUS | Status: AC
  Administered 2023-12-15: 2 g via INTRAVENOUS
  Filled 2023-12-15: qty 12.5

## 2023-12-15 MED ORDER — ENSURE ENLIVE PO LIQD
237.0000 mL | Freq: Two times a day (BID) | ORAL | Status: DC
  Administered 2023-12-16 – 2023-12-26 (×17): 237 mL via ORAL

## 2023-12-15 MED ORDER — ASPIRIN 81 MG PO TBEC
81.0000 mg | DELAYED_RELEASE_TABLET | Freq: Every day | ORAL | Status: DC
  Administered 2023-12-15 – 2023-12-26 (×12): 81 mg via ORAL
  Filled 2023-12-15 (×12): qty 1

## 2023-12-15 MED ORDER — IOHEXOL 300 MG/ML  SOLN
100.0000 mL | Freq: Once | INTRAMUSCULAR | Status: AC | PRN
  Administered 2023-12-15: 100 mL via INTRAVENOUS

## 2023-12-15 MED ORDER — ENOXAPARIN SODIUM 30 MG/0.3ML IJ SOSY: 30.0000 mg | PREFILLED_SYRINGE | INTRAMUSCULAR | Status: DC

## 2023-12-15 MED ORDER — SODIUM CHLORIDE 0.9 % IV SOLN
INTRAVENOUS | Status: AC
Start: 2023-12-15 — End: 2023-12-16

## 2023-12-15 MED ORDER — ONDANSETRON HCL 4 MG PO TABS: 4.0000 mg | ORAL_TABLET | Freq: Four times a day (QID) | ORAL | Status: DC | PRN

## 2023-12-15 MED ORDER — METRONIDAZOLE 500 MG/100ML IV SOLN
500.0000 mg | Freq: Once | INTRAVENOUS | Status: AC
  Administered 2023-12-15: 500 mg via INTRAVENOUS
  Filled 2023-12-15: qty 100

## 2023-12-15 MED ORDER — ENOXAPARIN SODIUM 40 MG/0.4ML IJ SOSY
40.0000 mg | PREFILLED_SYRINGE | INTRAMUSCULAR | Status: DC
  Administered 2023-12-16 – 2023-12-25 (×11): 40 mg via SUBCUTANEOUS
  Filled 2023-12-15 (×11): qty 0.4

## 2023-12-15 MED ORDER — SODIUM CHLORIDE 0.9 % IV SOLN
1.0000 g | INTRAVENOUS | Status: AC
  Administered 2023-12-16 – 2023-12-20 (×5): 1 g via INTRAVENOUS
  Filled 2023-12-15 (×6): qty 10

## 2023-12-15 MED ORDER — MIRTAZAPINE 15 MG PO TBDP
30.0000 mg | ORAL_TABLET | Freq: Every day | ORAL | Status: DC
  Administered 2023-12-17 – 2023-12-25 (×9): 30 mg via ORAL
  Filled 2023-12-15 (×10): qty 2

## 2023-12-15 MED ORDER — ONDANSETRON HCL 4 MG/2ML IJ SOLN
4.0000 mg | Freq: Four times a day (QID) | INTRAMUSCULAR | Status: DC | PRN
  Administered 2023-12-16 – 2023-12-19 (×3): 4 mg via INTRAVENOUS
  Filled 2023-12-15 (×3): qty 2

## 2023-12-15 NOTE — Progress Notes (Signed)
CODE SEPSIS - PHARMACY COMMUNICATION  **Broad Spectrum Antibiotics should be administered within 1 hour of Sepsis diagnosis**  Time Code Sepsis Called/Page Received: 1218  Antibiotics Ordered: cefepime and metronidazole  Time of 1st antibiotic administration: 1254  Additional action taken by pharmacy: None  If necessary, Name of Provider/Nurse Contacted: None   Rockwell Alexandria ,PharmD Clinical Pharmacist  12/15/2023  12:26 PM

## 2023-12-15 NOTE — ED Provider Notes (Signed)
[facility] Provider Note    Event Date/Time   First MD Initiated Contact with Patient 12/15/23 1112     (approximate)   History   Emesis   HPI  Krystal Richardson is a 89 y.o. female with past medical history of GERD, prior DVT, hyperlipidemia, here with fall and weakness.  The patient states that her symptoms started as acute onset of vomiting profusely last night.  She also felt very dizzy.  Since then, she has felt incredibly weak, dizzy with any kind of standing, and has been unable to ambulate independently, whereas normally she has.  She lives alone but family is nearby.  She states she got up to go to the bathroom today and fell on her way home.  Denies any direct or major trauma, and states her legs just gave out due to weakness and dizziness.  No history of similar presentations.  No other complaints.  No known sick contacts.     Physical Exam   Triage Vital Signs: ED Triage Vitals  Encounter Vitals Group     BP 12/15/23 1031 (!) 177/55     Systolic BP Percentile --      Diastolic BP Percentile --      Pulse Rate 12/15/23 1031 68     Resp 12/15/23 1031 (!) 24     Temp 12/15/23 1031 97.9 F (36.6 C)     Temp src --      SpO2 12/15/23 1031 100 %     Weight 12/15/23 1030 111 lb 11.2 oz (50.7 kg)     Height 12/15/23 1030 5\' 3"  (1.6 m)     Head Circumference --      Peak Flow --      Pain Score 12/15/23 1030 0     Pain Loc --      Pain Education --      Exclude from Growth Chart --     Most recent vital signs: Vitals:   12/15/23 1200 12/15/23 1339  BP: (!) 148/62 (!) 143/59  Pulse: 66 76  Resp: (!) 29 (!) 28  Temp:    SpO2: 100% 100%     General: Awake, no distress.  CV:  Good peripheral perfusion.  Regular rate and rhythm. Resp:  Normal work of breathing.  Lungs clear to auscultation bilaterally. Abd:  No distention.  No tenderness.  No guarding or rebound.  Specifically, no suprapubic tenderness or CVA tenderness. Other:  Alert,  oriented.  No focal neurological deficits.  Appears generally weak.  She does have some bidirectional nystagmus as well with extraocular movements.  No gross dysmetria.   ED Results / Procedures / Treatments   Labs (all labs ordered are listed, but only abnormal results are displayed) Labs Reviewed  COMPREHENSIVE METABOLIC PANEL - Abnormal; Notable for the following components:      Result Value   Sodium 127 (*)    Chloride 97 (*)    CO2 18 (*)    Glucose, Bld 158 (*)    All other components within normal limits  URINALYSIS, ROUTINE W REFLEX MICROSCOPIC - Abnormal; Notable for the following components:   Color, Urine STRAW (*)    APPearance HAZY (*)    Glucose, UA 50 (*)    Ketones, ur 20 (*)    Leukocytes,Ua MODERATE (*)    Bacteria, UA RARE (*)    All other components within normal limits  LACTIC ACID, PLASMA - Abnormal; Notable for the following components:  Lactic Acid, Venous 3.4 (*)    All other components within normal limits  LACTIC ACID, PLASMA - Abnormal; Notable for the following components:   Lactic Acid, Venous 2.3 (*)    All other components within normal limits  TROPONIN I (HIGH SENSITIVITY) - Abnormal; Notable for the following components:   Troponin I (High Sensitivity) 19 (*)    All other components within normal limits  TROPONIN I (HIGH SENSITIVITY) - Abnormal; Notable for the following components:   Troponin I (High Sensitivity) 19 (*)    All other components within normal limits  URINE CULTURE  CULTURE, BLOOD (ROUTINE X 2)  CULTURE, BLOOD (ROUTINE X 2)  CBC WITH DIFFERENTIAL/PLATELET  LIPASE, BLOOD     EKG Normal sinus rhythm, ventricular rate 61.  PR 118, QRS 80, QTc 41.  No acute ST elevations or depressions.  No ischemia or infarct.   RADIOLOGY CT head/C-spine: CT head shows chronic changes related to previous melanoma, CT C-spine shows no acute abnormality CT C/A/P: No acute finding, no obstruction   I also independently reviewed and agree  with radiologist interpretations.   PROCEDURES:  Critical Care performed: Yes, see critical care procedure note(s)  .Critical Care  Performed by: Shaune Pollack, MD Authorized by: Shaune Pollack, MD   Critical care provider statement:    Critical care time (minutes):  30   Critical care time was exclusive of:  Separately billable procedures and treating other patients   Critical care was necessary to treat or prevent imminent or life-threatening deterioration of the following conditions:  Cardiac failure, circulatory failure and respiratory failure   Critical care was time spent personally by me on the following activities:  Development of treatment plan with patient or surrogate, discussions with consultants, evaluation of patient's response to treatment, examination of patient, ordering and review of laboratory studies, ordering and review of radiographic studies, ordering and performing treatments and interventions, pulse oximetry, re-evaluation of patient's condition and review of old charts     MEDICATIONS ORDERED IN ED: Medications  sodium chloride 0.9 % bolus 1,000 mL ( Intravenous Restarted 12/15/23 1327)  ceFEPIme (MAXIPIME) 2 g in sodium chloride 0.9 % 100 mL IVPB (0 g Intravenous Stopped 12/15/23 1327)  metroNIDAZOLE (FLAGYL) IVPB 500 mg (500 mg Intravenous New Bag/Given 12/15/23 1330)  sodium chloride 0.9 % bolus 1,000 mL (0 mLs Intravenous Stopped 12/15/23 1328)  iohexol (OMNIPAQUE) 300 MG/ML solution 100 mL (100 mLs Intravenous Contrast Given 12/15/23 1356)     IMPRESSION / MDM / ASSESSMENT AND PLAN / ED COURSE  I reviewed the triage vital signs and the nursing notes.                              Differential diagnosis includes, but is not limited to, foodborne versus viral GI versus intra-abdominal infection causing vomiting with dehydration, CVA with vertigo, UTI, metabolic derangement, polypharmacy  Patient's presentation is most consistent with acute  presentation with potential threat to life or bodily function.  The patient is on the cardiac monitor to evaluate for evidence of arrhythmia and/or significant heart rate changes   89 year old female here with generalized weakness, dizziness, difficulty ambulating.  Clinically, suspect UTI with nausea and vomiting and subsequent dehydration, with early sepsis.  Lab work shows significant dehydration with hyponatremia, bicarb 18.  Lactic acid 3.4 consistent with severe sepsis.  Broad-spectrum antibiotics and fluids given.  UA shows ketonuria as well as pyuria.  Suspect this is the  case.  CT scan is pending. Ordered as c/a/p given h/o melanoma and abnormal findings on CT head, though this could be related to prior tx  Regarding her dizziness, I suspect this is secondary to orthostasis and or dehydration.  She does have some nystagmus on exam although it is unclear whether this is chronic and she does not have other signs of cerebellar abnormality.  CT head shows no acute abnormality.  If symptoms persist after resuscitation, would consider MRI.   FINAL CLINICAL IMPRESSION(S) / ED DIAGNOSES   Final diagnoses:  Sepsis without acute organ dysfunction, due to unspecified organism (HCC)  Lactic acidosis  Dizziness     Rx / DC Orders   ED Discharge Orders     None        Note:  This document was prepared using Dragon voice recognition software and may include unintentional dictation errors.   Shaune Pollack, MD 12/15/23 [id number]

## 2023-12-15 NOTE — Progress Notes (Signed)
ED Pharmacy Antibiotic Sign Off An antibiotic consult was received from an ED provider for cefepime per pharmacy dosing for intra-abdominal infection. A chart review was completed to assess appropriateness.   The following one time order(s) were placed:  Cefepime 2 g IV x1 Metronidazole 500 mg IV x1  Further antibiotic and/or antibiotic pharmacy consults should be ordered by the admitting provider if indicated.   Thank you for allowing pharmacy to be a part of this patient's care.   Rockwell [place], [facility]  Clinical Pharmacist 12/15/23 12:24 PM

## 2023-12-15 NOTE — Sepsis Progress Note (Signed)
eLink is following this Code Sepsis. °

## 2023-12-15 NOTE — ED Triage Notes (Signed)
Pt to ED via [facility] from home for c/o n/v that began last night. Pt A&O. Pt given 4 zofran and 400 mL LR by EMS.  BP 172/69 HR 72 O2 97% room air Temp 97.8 Cbg 191

## 2023-12-15 NOTE — H&P (Signed)
History and Physical    ELLIONNA AURAND EXB:[id number] DOB: [birth year] DOA: 12/15/2023  PCP: Barbette Reichmann, MD (Confirm with patient/family/NH records and if not entered, this has to be entered at [facility] point of entry) Patient coming from: Home  I have personally briefly reviewed patient's old medical records in [facility]  Chief Complaint: Feeling weak  HPI: NKENGE Richardson is a 89 y.o. female with medical history significant of melanoma of scalp status post radiation and chemotherapy, anxiety/depression, basal cell carcinoma, HLD, presented with generalized weakness and fall.  Patient ate some chicken pot pie for dinner and then woke up sometime after midnight started to have vomiting x 3-4 times of stomach content, but denied any diarrhea no abdominal pain this morning, patient woke up this morning feeling dizziness when standing up and rising up, meantime feeling bilateral lower extremity weakness and fell and sliding down to the floor denied any LOC, no head or neck injury.  ED Course: Afebrile, nontachycardic nonhypotensive blood pressure 170/55 O2 saturation 100% on room air.  CT head showed no acute findings but irregular thickening of the calvarium and overlying scalp of the posterior frontal bone likely changes related to melanoma and treatment.  Blood work showed sodium 127, lactic acid 3.42.3, WBC 6.7.  UA showed WBC 21-50 with leukocytes  Patient was given IV bolus x 2 of total 2000 mL and ceftriaxone x 1 and started to feel better.  Review of Systems: As per HPI otherwise 14 point review of systems negative.    Past Medical History:  Diagnosis Date   Actinic keratosis 03/12/2019   mid post scalp melanoma graft scar   Basal cell carcinoma 08/04/2014   R forehead - excision 08/19/2014   BCC (basal cell carcinoma of skin) 02/20/2019   L dorsum foot   Hyperlipidemia    Melanoma (HCC) 2002   with chemo and rad tx   Personal history of chemotherapy 2003   MELANOMA    Personal history of radiation therapy 2002   MELANOMA   Stomach ulcer    Urethral caruncle     Past Surgical History:  Procedure Laterality Date   BREAST EXCISIONAL BIOPSY Left 1986   EXCISIONAL - NEG   cataract     hemmriodectomy  1980   melanoma  2002     reports that she has never smoked. She has never used smokeless tobacco. She reports that she does not drink alcohol and does not use drugs.  Allergies  Allergen Reactions   Toprol Xl [Metoprolol Tartrate]     Family History  Problem Relation Age of Onset   Kidney disease Neg Hx    Bladder Cancer Neg Hx    Prostate cancer Neg Hx    Breast cancer Neg Hx      Prior to Admission medications   Medication Sig Start Date End Date Taking? Authorizing Provider  aspirin EC 81 MG tablet Take 1 tablet (81 mg total) by mouth daily. 08/14/21   Leroy Sea, MD  DULoxetine (CYMBALTA) 30 MG capsule Take 1 capsule (30 mg total) by mouth daily. 07/27/21 01/06/22  Lorin Glass, MD  feeding supplement (ENSURE ENLIVE / ENSURE PLUS) LIQD Take 237 mLs by mouth 2 (two) times daily between meals. 07/26/21   Dahal, Melina Schools, MD  levothyroxine (SYNTHROID, LEVOTHROID) 25 MCG tablet Take 25 mcg by mouth daily before breakfast.    [provider]  mirtazapine (REMERON SOL-TAB) 30 MG disintegrating tablet Take 1 tablet (30 mg total) by mouth at  bedtime. 08/14/21   Leroy Sea, MD  mupirocin ointment (BACTROBAN) 2 % Apply 1 Application topically daily. Qd to wound on scalp 07/03/23   Willeen Niece, MD  pantoprazole (PROTONIX) 40 MG tablet Take 1 tablet (40 mg total) by mouth daily. Patient not taking: Reported on 01/06/2022 08/14/21   Leroy Sea, MD    Physical Exam: Vitals:   12/15/23 1500 12/15/23 1530 12/15/23 1600 12/15/23 1603  BP: (!) 125/51 (!) 118/54 (!) 124/54   Pulse: 64 72 67   Resp: 19 20 17    Temp:    98.7 F (37.1 C)  TempSrc:    Oral  SpO2: 93% 93% 95%   Weight:      Height:        Constitutional: NAD, calm,  comfortable Vitals:   12/15/23 1500 12/15/23 1530 12/15/23 1600 12/15/23 1603  BP: (!) 125/51 (!) 118/54 (!) 124/54   Pulse: 64 72 67   Resp: 19 20 17    Temp:    98.7 F (37.1 C)  TempSrc:    Oral  SpO2: 93% 93% 95%   Weight:      Height:       Eyes: PERRL, lids and conjunctivae normal ENMT: Mucous membranes are dry. Posterior pharynx clear of any exudate or lesions.Normal dentition.  Neck: normal, supple, no masses, no thyromegaly Respiratory: clear to auscultation bilaterally, no wheezing, no crackles. Normal respiratory effort. No accessory muscle use.  Cardiovascular: Regular rate and rhythm, no murmurs / rubs / gallops. No extremity edema. 2+ pedal pulses. No carotid bruits.  Abdomen: no tenderness, no masses palpated. No hepatosplenomegaly. Bowel sounds positive.  Musculoskeletal: no clubbing / cyanosis. No joint deformity upper and lower extremities. Good ROM, no contractures. Normal muscle tone.  Skin: no rashes, lesions, ulcers. No induration Neurologic: CN 2-12 grossly intact. Sensation intact, DTR normal. Strength 5/5 in all 4.  Psychiatric: Normal judgment and insight. Alert and oriented x 3. Normal mood.     Labs on Admission: I have personally reviewed following labs and imaging studies  CBC: Recent Labs  Lab 12/15/23 1041  WBC 6.7  NEUTROABS 5.6  HGB 13.1  HCT 37.6  MCV 87.4  PLT 231   Basic Metabolic Panel: Recent Labs  Lab 12/15/23 1041  NA 127*  K 3.6  CL 97*  CO2 18*  GLUCOSE 158*  BUN 15  CREATININE 0.84  CALCIUM 9.1   GFR: Estimated Creatinine Clearance: 34.2 mL/min (by C-G formula based on SCr of 0.84 mg/dL). Liver Function Tests: Recent Labs  Lab 12/15/23 1041  AST 29  ALT 15  ALKPHOS 64  BILITOT 0.8  PROT 7.3  ALBUMIN 4.1   Recent Labs  Lab 12/15/23 1041  LIPASE 29   No results for input(s): "AMMONIA" in the last 168 hours. Coagulation Profile: No results for input(s): "INR", "PROTIME" in the last 168 hours. Cardiac  Enzymes: No results for input(s): "CKTOTAL", "CKMB", "CKMBINDEX", "TROPONINI" in the last 168 hours. BNP (last 3 results) No results for input(s): "PROBNP" in the last 8760 hours. HbA1C: No results for input(s): "HGBA1C" in the last 72 hours. CBG: No results for input(s): "GLUCAP" in the last 168 hours. Lipid Profile: No results for input(s): "CHOL", "HDL", "LDLCALC", "TRIG", "CHOLHDL", "LDLDIRECT" in the last 72 hours. Thyroid Function Tests: No results for input(s): "TSH", "T4TOTAL", "FREET4", "T3FREE", "THYROIDAB" in the last 72 hours. Anemia Panel: No results for input(s): "VITAMINB12", "FOLATE", "FERRITIN", "TIBC", "IRON", "RETICCTPCT" in the last 72 hours. Urine analysis:  Component Value Date/Time   COLORURINE STRAW (A) 12/15/2023 1116   APPEARANCEUR HAZY (A) 12/15/2023 1116   APPEARANCEUR Clear 06/25/2015 1416   LABSPEC 1.009 12/15/2023 1116   LABSPEC 1.010 09/09/2013 1201   PHURINE 8.0 12/15/2023 1116   GLUCOSEU 50 (A) 12/15/2023 1116   GLUCOSEU Negative 09/09/2013 1201   HGBUR NEGATIVE 12/15/2023 1116   BILIRUBINUR NEGATIVE 12/15/2023 1116   BILIRUBINUR Negative 06/25/2015 1416   BILIRUBINUR Negative 09/09/2013 1201   KETONESUR 20 (A) 12/15/2023 1116   PROTEINUR NEGATIVE 12/15/2023 1116   NITRITE NEGATIVE 12/15/2023 1116   LEUKOCYTESUR MODERATE (A) 12/15/2023 1116   LEUKOCYTESUR 3+ 09/09/2013 1201    Radiological Exams on Admission: CT CHEST ABDOMEN PELVIS W CONTRAST Result Date: 12/15/2023 CLINICAL DATA:  Abdominal pain. EXAM: CT CHEST, ABDOMEN, AND PELVIS WITH CONTRAST TECHNIQUE: Multidetector CT imaging of the chest, abdomen and pelvis was performed following the standard protocol during bolus administration of intravenous contrast. RADIATION DOSE REDUCTION: This exam was performed according to the departmental dose-optimization program which includes automated exposure control, adjustment of the mA and/or kV according to patient size and/or use of iterative  reconstruction technique. CONTRAST:  OMNIPAQUE IOHEXOL 300 MG/ML  SOLN COMPARISON:  None Available. FINDINGS: CT CHEST FINDINGS Cardiovascular: Coronary artery calcification and aortic atherosclerotic calcification. Mediastinum/Nodes: No axillary or supraclavicular adenopathy. No mediastinal or hilar adenopathy. No pericardial fluid. Esophagus normal. Lungs/Pleura: No suspicious pulmonary nodules. Normal pleural. Airways normal. Musculoskeletal: No aggressive osseous lesion. CT ABDOMEN AND PELVIS FINDINGS Hepatobiliary: No focal hepatic lesion. No biliary ductal dilatation. Gallbladder is normal. Common bile duct is normal. Pancreas: Normal pancreas Spleen: Normal spleen Adrenals/urinary tract: Adrenal glands and kidneys are normal. Simple fluid attenuation cyst of the LEFT kidney measures 4 cm on postcontrast imaging. The ureters and bladder normal. Stomach/Bowel: Stomach, small-bowel and cecum are normal. The appendix is not identified but there is no pericecal inflammation to suggest appendicitis. The colon and rectosigmoid colon are normal. Vascular/Lymphatic: Abdominal aorta is normal caliber with atherosclerotic calcification. There is no retroperitoneal or periportal lymphadenopathy. No pelvic lymphadenopathy. Reproductive: Round solid lesion in the LEFT operator space with internal speckled calcifications measures 3.7 x 3.4 cm (image 91/series 3. This lesion is not changed in size or imaging characteristics from CT 7 09/14/2021. Other: No free fluid. Musculoskeletal: No aggressive osseous lesion. IMPRESSION: 1. No acute findings the chest abdomen pelvis. 2. No bowel obstruction or inflammation. 3. Appendix not identified but no secondary signs appendicitis. 4. Stable solid rounded lesion in the LEFT pelvic sidewall from 07/22/2021. Stability over time favors benign etiology. Electronically Signed   By: Genevive Bi M.D.   On: 12/15/2023 14:40   DG Chest Portable 1 View Result Date:  12/15/2023 CLINICAL DATA:  Weakness EXAM: PORTABLE CHEST 1 VIEW COMPARISON:  None Available. FINDINGS: Normal mediastinum and cardiac silhouette. Normal pulmonary vasculature. No evidence of effusion, infiltrate, or pneumothorax. No acute bony abnormality. IMPRESSION: No acute cardiopulmonary process. Electronically Signed   By: Genevive Bi M.D.   On: 12/15/2023 14:25   CT HEAD WO CONTRAST ( ) Result Date: 12/15/2023 CLINICAL DATA:  Falls, vertigo, nausea and vomiting EXAM: CT HEAD WITHOUT CONTRAST CT CERVICAL SPINE WITHOUT CONTRAST TECHNIQUE: Multidetector CT imaging of the head and cervical spine was performed following the standard protocol without intravenous contrast. Multiplanar CT image reconstructions of the cervical spine were also generated. RADIATION DOSE REDUCTION: This exam was performed according to the departmental dose-optimization program which includes automated exposure control, adjustment of the mA and/or kV according to  patient size and/or use of iterative reconstruction technique. COMPARISON:  09/09/2013 CT head and 09/10/2013 MRI head, no prior CT cervical spine trauma correlation is made with 01/27/2005 MRI cervical spine FINDINGS: CT HEAD FINDINGS Brain: No evidence of acute infarct, hemorrhage, mass, mass effect, or midline shift. No hydrocephalus or extra-axial fluid collection. Age related cerebral atrophy. Periventricular white matter changes, likely the sequela of chronic small vessel ischemic disease. Remote lacunar infarcts in the basal ganglia. Vascular: No hyperdense vessel. Atherosclerotic calcifications in the intracranial carotid and vertebral arteries. Skull: Negative for fracture. Irregular thinning of the calvarium and overlying scalp of the posterior frontal bone (series 5, image 31 of the and series 4, image 19), of indeterminate etiology. Sinuses/Orbits: No acute finding. CT CERVICAL SPINE FINDINGS Alignment: No traumatic listhesis. Skull base and vertebrae: No  acute fracture or suspicious osseous lesion. Soft tissues and spinal canal: No prevertebral fluid or swelling. No visible canal hematoma. Heterogeneous thyroid, with the largest discrete lesion measuring up to 1.1 cm, for which no follow-up is currently indicated. (Reference: J Am Coll Radiol. 2015 Feb;12(2): 143-50) Disc levels: Degenerative changes in the cervical spine.No high-grade spinal canal stenosis. Upper chest: No focal pulmonary opacity or pleural effusion. IMPRESSION: 1. No acute intracranial process. 2. No acute fracture or traumatic listhesis in the cervical spine. 3. Irregular thinning of the calvarium and overlying scalp of the posterior frontal bone, of indeterminate etiology but possibly related to the patient's history of melanoma. Correlate with physical exam and/or surgical history. Electronically Signed   By: Wiliam Ke M.D.   On: 12/15/2023 13:19   CT CERVICAL SPINE WO CONTRAST Result Date: 12/15/2023 CLINICAL DATA:  Falls, vertigo, nausea and vomiting EXAM: CT HEAD WITHOUT CONTRAST CT CERVICAL SPINE WITHOUT CONTRAST TECHNIQUE: Multidetector CT imaging of the head and cervical spine was performed following the standard protocol without intravenous contrast. Multiplanar CT image reconstructions of the cervical spine were also generated. RADIATION DOSE REDUCTION: This exam was performed according to the departmental dose-optimization program which includes automated exposure control, adjustment of the mA and/or kV according to patient size and/or use of iterative reconstruction technique. COMPARISON:  09/09/2013 CT head and 09/10/2013 MRI head, no prior CT cervical spine trauma correlation is made with 01/27/2005 MRI cervical spine FINDINGS: CT HEAD FINDINGS Brain: No evidence of acute infarct, hemorrhage, mass, mass effect, or midline shift. No hydrocephalus or extra-axial fluid collection. Age related cerebral atrophy. Periventricular white matter changes, likely the sequela of chronic  small vessel ischemic disease. Remote lacunar infarcts in the basal ganglia. Vascular: No hyperdense vessel. Atherosclerotic calcifications in the intracranial carotid and vertebral arteries. Skull: Negative for fracture. Irregular thinning of the calvarium and overlying scalp of the posterior frontal bone (series 5, image 31 of the and series 4, image 19), of indeterminate etiology. Sinuses/Orbits: No acute finding. CT CERVICAL SPINE FINDINGS Alignment: No traumatic listhesis. Skull base and vertebrae: No acute fracture or suspicious osseous lesion. Soft tissues and spinal canal: No prevertebral fluid or swelling. No visible canal hematoma. Heterogeneous thyroid, with the largest discrete lesion measuring up to 1.1 cm, for which no follow-up is currently indicated. (Reference: J Am Coll Radiol. 2015 Feb;12(2): 143-50) Disc levels: Degenerative changes in the cervical spine.No high-grade spinal canal stenosis. Upper chest: No focal pulmonary opacity or pleural effusion. IMPRESSION: 1. No acute intracranial process. 2. No acute fracture or traumatic listhesis in the cervical spine. 3. Irregular thinning of the calvarium and overlying scalp of the posterior frontal bone, of indeterminate etiology but possibly  related to the patient's history of melanoma. Correlate with physical exam and/or surgical history. Electronically Signed   By: Wiliam Ke M.D.   On: 12/15/2023 13:19    EKG: Independently reviewed.  Sinus rhythm, no acute ST changes.  Assessment/Plan Principal Problem:   Fall Active Problems:   UTI (urinary tract infection)   Dehydration  (please populate well all problems here in Problem List. (For example, if patient is on BP meds at home and you resume or decide to hold them, it is a problem that needs to be her. Same for CAD, COPD, HLD and so on)  Near syncope -Likely vasovagal secondary to severe dehydration probably related to repeated nauseous vomiting last night.  Overall improving after  IV boluses in the ED. -Other DDx, neurological exam nonfocal, low suspicion for stroke.  SIRS Elevated lactate level -Likely secondary to severe dehydration, doubt sepsis although patient does have a UTI.  UTI -Continue cefadroxil  Hyponatremia, acute on chronic -Hypovolemic secondary to repeated nauseous vomiting last night -Clinically patient appears to be on the dry side, despite 2 L of IV bolus, will continue IV hydration for another 10 hours then reevaluate. -Send osmolality, urine osmolality and urine sodium level.  And TSH  DVT prophylaxis: Lovenox Code Status: DNR Family Communication: Son at bedside Disposition Plan: Back less than 2 midnight hospital stay Consults called: None Admission status: Telemetry observation   Emeline General MD [facility] Pager [phone]  12/15/2023, 4:03 PM

## 2023-12-15 NOTE — ED Notes (Signed)
Erma Heritage, MD, made aware of critical lactic 3.4

## 2023-12-15 NOTE — Progress Notes (Signed)
PHARMACIST - PHYSICIAN COMMUNICATION  CONCERNING:  Enoxaparin (Lovenox) for DVT Prophylaxis    RECOMMENDATION: Patient was prescribed enoxaprin 30mg  q24 hours for VTE prophylaxis.   Filed Weights   12/15/23 1030  Weight: 50.7 kg (111 lb 11.2 oz)    Body mass index is 19.79 kg/m.  Estimated Creatinine Clearance: 34.2 mL/min (by C-G formula based on SCr of 0.84 mg/dL).  Patient is a candidate for enoxaparin 40 mg q24h given she does not meet the criteria for dose change below:  Based on [place] policy patient is candidate for enoxaparin 0.5mg /kg TBW SQ every 24 hours based on BMI being >30.  Patient is candidate for enoxaparin 30mg  every 24 hours based on CrCl <77ml/min or Weight <45kg  DESCRIPTION: Pharmacy has adjusted enoxaparin dose per [facility] policy.  Patient is now receiving enoxaparin 40 mg every 24 hours    Merryl Hacker, PharmD Clinical Pharmacist  12/15/2023 3:54 PM

## 2023-12-16 ENCOUNTER — Encounter: Payer: Self-pay | Admitting: Student

## 2023-12-16 DIAGNOSIS — E861 Hypovolemia: Secondary | ICD-10-CM | POA: Diagnosis present

## 2023-12-16 DIAGNOSIS — F32A Depression, unspecified: Secondary | ICD-10-CM | POA: Diagnosis present

## 2023-12-16 DIAGNOSIS — B9620 Unspecified Escherichia coli [E. coli] as the cause of diseases classified elsewhere: Secondary | ICD-10-CM | POA: Diagnosis present

## 2023-12-16 DIAGNOSIS — E871 Hypo-osmolality and hyponatremia: Secondary | ICD-10-CM | POA: Diagnosis present

## 2023-12-16 DIAGNOSIS — Z79890 Hormone replacement therapy (postmenopausal): Secondary | ICD-10-CM | POA: Diagnosis not present

## 2023-12-16 DIAGNOSIS — A419 Sepsis, unspecified organism: Secondary | ICD-10-CM | POA: Diagnosis not present

## 2023-12-16 DIAGNOSIS — R42 Dizziness and giddiness: Secondary | ICD-10-CM | POA: Diagnosis not present

## 2023-12-16 DIAGNOSIS — Z79899 Other long term (current) drug therapy: Secondary | ICD-10-CM | POA: Diagnosis not present

## 2023-12-16 DIAGNOSIS — Z602 Problems related to living alone: Secondary | ICD-10-CM | POA: Diagnosis present

## 2023-12-16 DIAGNOSIS — I5031 Acute diastolic (congestive) heart failure: Secondary | ICD-10-CM | POA: Diagnosis not present

## 2023-12-16 DIAGNOSIS — Z7982 Long term (current) use of aspirin: Secondary | ICD-10-CM | POA: Diagnosis not present

## 2023-12-16 DIAGNOSIS — I959 Hypotension, unspecified: Secondary | ICD-10-CM | POA: Diagnosis present

## 2023-12-16 DIAGNOSIS — Z681 Body mass index (BMI) 19 or less, adult: Secondary | ICD-10-CM | POA: Diagnosis not present

## 2023-12-16 DIAGNOSIS — J9601 Acute respiratory failure with hypoxia: Secondary | ICD-10-CM | POA: Diagnosis not present

## 2023-12-16 DIAGNOSIS — E039 Hypothyroidism, unspecified: Secondary | ICD-10-CM | POA: Diagnosis present

## 2023-12-16 DIAGNOSIS — E8770 Fluid overload, unspecified: Secondary | ICD-10-CM | POA: Diagnosis not present

## 2023-12-16 DIAGNOSIS — W19XXXA Unspecified fall, initial encounter: Secondary | ICD-10-CM | POA: Diagnosis not present

## 2023-12-16 DIAGNOSIS — Z66 Do not resuscitate: Secondary | ICD-10-CM | POA: Diagnosis present

## 2023-12-16 DIAGNOSIS — E872 Acidosis, unspecified: Secondary | ICD-10-CM | POA: Diagnosis present

## 2023-12-16 DIAGNOSIS — E639 Nutritional deficiency, unspecified: Secondary | ICD-10-CM | POA: Diagnosis present

## 2023-12-16 DIAGNOSIS — Z7189 Other specified counseling: Secondary | ICD-10-CM | POA: Diagnosis not present

## 2023-12-16 DIAGNOSIS — K219 Gastro-esophageal reflux disease without esophagitis: Secondary | ICD-10-CM | POA: Diagnosis present

## 2023-12-16 DIAGNOSIS — E876 Hypokalemia: Secondary | ICD-10-CM | POA: Diagnosis not present

## 2023-12-16 DIAGNOSIS — E785 Hyperlipidemia, unspecified: Secondary | ICD-10-CM | POA: Diagnosis present

## 2023-12-16 DIAGNOSIS — W19XXXD Unspecified fall, subsequent encounter: Secondary | ICD-10-CM | POA: Diagnosis not present

## 2023-12-16 DIAGNOSIS — E860 Dehydration: Secondary | ICD-10-CM | POA: Diagnosis present

## 2023-12-16 DIAGNOSIS — N390 Urinary tract infection, site not specified: Secondary | ICD-10-CM | POA: Diagnosis present

## 2023-12-16 DIAGNOSIS — H903 Sensorineural hearing loss, bilateral: Secondary | ICD-10-CM | POA: Diagnosis present

## 2023-12-16 DIAGNOSIS — R55 Syncope and collapse: Secondary | ICD-10-CM | POA: Diagnosis present

## 2023-12-16 LAB — BASIC METABOLIC PANEL
Anion gap: 4 — ABNORMAL LOW (ref 5–15)
BUN: 14 mg/dL (ref 8–23)
CO2: 20 mmol/L — ABNORMAL LOW (ref 22–32)
Calcium: 8.3 mg/dL — ABNORMAL LOW (ref 8.9–10.3)
Chloride: 109 mmol/L (ref 98–111)
Creatinine, Ser: 0.7 mg/dL (ref 0.44–1.00)
GFR, Estimated: >60 mL/min (ref >60)
Glucose, Bld: 97 mg/dL (ref 70–99)
Potassium: 3.7 mmol/L (ref 3.5–5.1)
Sodium: 133 mmol/L — ABNORMAL LOW (ref 135–145)

## 2023-12-16 MED ORDER — PANTOPRAZOLE SODIUM 40 MG PO TBEC: 40.0000 mg | DELAYED_RELEASE_TABLET | Freq: Every day | ORAL | Status: DC

## 2023-12-16 MED ORDER — CARMEX CLASSIC LIP BALM EX OINT: 1.0000 | TOPICAL_OINTMENT | CUTANEOUS | Status: DC | PRN

## 2023-12-16 MED ORDER — PANTOPRAZOLE SODIUM 40 MG PO TBEC
40.0000 mg | DELAYED_RELEASE_TABLET | Freq: Every day | ORAL | Status: DC
  Administered 2023-12-16 – 2023-12-26 (×11): 40 mg via ORAL
  Filled 2023-12-16 (×11): qty 1

## 2023-12-16 MED ORDER — SODIUM CHLORIDE 0.9 % IV SOLN: INTRAVENOUS | Status: DC

## 2023-12-16 NOTE — ED Notes (Signed)
Pts sats dropped slightly when sleeping to 85% on RA, placed on 2L [place], sats improved to 98%

## 2023-12-16 NOTE — Progress Notes (Signed)
[facility] Progress Note  Patient: Krystal Richardson    ZOX:[id number]  DOA: 12/15/2023     Date of Service: the patient was seen and examined on 12/16/2023  Chief Complaint  Patient presents with   Emesis   Brief hospital course: Krystal Richardson is a 89 y.o. female with medical history significant of melanoma of scalp status post radiation and chemotherapy, anxiety/depression, basal cell carcinoma, HLD, presented with generalized weakness and fall.   Patient ate some chicken pot pie for dinner and then woke up sometime after midnight started to have vomiting x 3-4 times of stomach content, but denied any diarrhea no abdominal pain this morning, patient woke up this morning feeling dizziness when standing up and rising up, meantime feeling bilateral lower extremity weakness and fell and sliding down to the floor denied any LOC, no head or neck injury.   ED Course: Afebrile, nontachycardic nonhypotensive blood pressure 170/55 O2 saturation 100% on room air.  CT head showed no acute findings but irregular thickening of the calvarium and overlying scalp of the posterior frontal bone likely changes related to melanoma and treatment.  Blood work showed sodium 127, lactic acid 3.42.3, WBC 6.7.  UA showed WBC 21-50 with leukocytes   Patient was given IV bolus x 2 of total 2000 mL and ceftriaxone x 1 and started to feel better.  Assessment and Plan: # Near syncope most likely due to hypotension caused by dehydration. Patient presented with repeated episodes of vomiting at night after eating dinner. Overall improving after IV boluses in the ED.    # Elevated lactate level, Likely secondary to severe dehydration,  doubt sepsis although patient does have a UTI. Does not meet SIRS criteria. Repeat lactic acid level    # UTI, UA LE moderate, WBC 21-50, bacteria rare. -Continue ceftriaxone 1 g IV daily Follow urine culture  # Metabolic acidosis, unknown cause Monitor bicarb  # Hyponatremia,  acute on chronic -Hypovolemic secondary to repeated nauseous vomiting at  night -Clinically patient appears to be on the dry side, despite 2 L of IV bolus, s/p IVF overnight - osmolality 281 wnl, urine osmolality 437 and urine sodium level 135.  And TSH 5.4 elevated    # Hypothyroid, patient was on Synthroid 25 mcg p.o. daily Patient has stopped taking medications seems likely due to depression TSH 5.4 elevated Resumed Synthroid home dose.  # Depression As per patient's son her husband passed away 2 years ago since then she has been depressed. Patient has stopped her home medications. Resumed Cymbalta 30 mg p.o. daily, Remeron 30 mg p.o. nightly Follow psych consult.  # Bilateral sensorineural deafness, patient uses hearing aids. Continue supportive care.   # Physical debility, presented with fall Follow PT and OT eval, patient may need placement  Body mass index is 19.79 kg/m.  Interventions:  Diet: Regular diet DVT Prophylaxis: Subcutaneous Lovenox   Advance goals of care discussion: DNR-limited  Family Communication: family was present at bedside, at the time of interview.  Management plan discussed with patient's son at bedside. Patient has sensorineural deafness and she had no hearing aids at that time, could not hear me. Patient was resting comfortably.   Disposition:  Pt is from Home, admitted with Fall, dehydration, UTI, still on IV antibiotics and IV fluids, risk of fall., which precludes a safe discharge. Discharge to SNF, TBD after PT/OT eval, when stable, may need 1-2 more days to improve.  Subjective: No significant events overnight, patient was admitted after fall  and dehydration due to persistent vomiting.  Currently patient is resting comfortably.  Patient does not have hearing aid at this time.  Seems resting comfortably, no acute distress. Management plan discussed with patient's son at bedside.  Physical Exam: General: NAD, lying comfortably Appear  in no distress, affect appropriate Eyes: PERRLA ENT: Oral Mucosa Clear, moist, b/l SNHL (uses Hearing aids) Neck: no JVD,  Cardiovascular: S1 and S2 Present, no Murmur,  Respiratory: good respiratory effort, Bilateral Air entry equal and Decreased, no Crackles, no wheezes Abdomen: Bowel Sound present, Soft and no tenderness,  Skin: no rashes Extremities: no Pedal edema, no calf tenderness Neurologic: without any new focal findings Gait not checked due to patient safety concerns  Vitals:   12/16/23 0600 12/16/23 0900 12/16/23 0914 12/16/23 1200  BP: (!) 120/48 (!) 115/45  (!) 116/46  Pulse: 65 75  71  Resp: 18 16  18   Temp: 98.4 F (36.9 C)  98.9 F (37.2 C)   TempSrc: Oral  Oral   SpO2: 94% 97%  94%  Weight:      Height:       No intake or output data in the 24 hours ending 12/16/23 1300 Filed Weights   12/15/23 1030  Weight: 50.7 kg    Data Reviewed: I have personally reviewed and interpreted daily labs, tele strips, imagings as discussed above. I reviewed all nursing notes, pharmacy notes, vitals, pertinent old records I have discussed plan of care as described above with RN and patient/family.  CBC: Recent Labs  Lab 12/15/23 1041  WBC 6.7  NEUTROABS 5.6  HGB 13.1  HCT 37.6  MCV 87.4  PLT 231   Basic Metabolic Panel: Recent Labs  Lab 12/15/23 1041 12/16/23 0609  NA 127* 133*  K 3.6 3.7  CL 97* 109  CO2 18* 20*  GLUCOSE 158* 97  BUN 15 14  CREATININE 0.84 0.70  CALCIUM 9.1 8.3*    Studies: CT CHEST ABDOMEN PELVIS W CONTRAST Result Date: 12/15/2023 CLINICAL DATA:  Abdominal pain. EXAM: CT CHEST, ABDOMEN, AND PELVIS WITH CONTRAST TECHNIQUE: Multidetector CT imaging of the chest, abdomen and pelvis was performed following the standard protocol during bolus administration of intravenous contrast. RADIATION DOSE REDUCTION: This exam was performed according to the departmental dose-optimization program which includes automated exposure control, adjustment  of the mA and/or kV according to patient size and/or use of iterative reconstruction technique. CONTRAST:  OMNIPAQUE IOHEXOL 300 MG/ML  SOLN COMPARISON:  None Available. FINDINGS: CT CHEST FINDINGS Cardiovascular: Coronary artery calcification and aortic atherosclerotic calcification. Mediastinum/Nodes: No axillary or supraclavicular adenopathy. No mediastinal or hilar adenopathy. No pericardial fluid. Esophagus normal. Lungs/Pleura: No suspicious pulmonary nodules. Normal pleural. Airways normal. Musculoskeletal: No aggressive osseous lesion. CT ABDOMEN AND PELVIS FINDINGS Hepatobiliary: No focal hepatic lesion. No biliary ductal dilatation. Gallbladder is normal. Common bile duct is normal. Pancreas: Normal pancreas Spleen: Normal spleen Adrenals/urinary tract: Adrenal glands and kidneys are normal. Simple fluid attenuation cyst of the LEFT kidney measures 4 cm on postcontrast imaging. The ureters and bladder normal. Stomach/Bowel: Stomach, small-bowel and cecum are normal. The appendix is not identified but there is no pericecal inflammation to suggest appendicitis. The colon and rectosigmoid colon are normal. Vascular/Lymphatic: Abdominal aorta is normal caliber with atherosclerotic calcification. There is no retroperitoneal or periportal lymphadenopathy. No pelvic lymphadenopathy. Reproductive: Round solid lesion in the LEFT operator space with internal speckled calcifications measures 3.7 x 3.4 cm (image 91/series 3. This lesion is not changed in size or imaging characteristics  from CT 7 09/14/2021. Other: No free fluid. Musculoskeletal: No aggressive osseous lesion. IMPRESSION: 1. No acute findings the chest abdomen pelvis. 2. No bowel obstruction or inflammation. 3. Appendix not identified but no secondary signs appendicitis. 4. Stable solid rounded lesion in the LEFT pelvic sidewall from 07/22/2021. Stability over time favors benign etiology. Electronically Signed   By: Genevive Bi M.D.   On:  12/15/2023 14:40    Scheduled Meds:  aspirin EC  81 mg Oral Daily   DULoxetine  30 mg Oral Daily   enoxaparin (LOVENOX) injection  40 mg Subcutaneous Q24H   feeding supplement  237 mL Oral BID BM   levothyroxine  25 mcg Oral QAC breakfast   mirtazapine  30 mg Oral QHS   pantoprazole  40 mg Oral Daily   Continuous Infusions:  cefTRIAXone (ROCEPHIN)  IV Stopped (12/16/23 1027)   PRN Meds: ondansetron **OR** ondansetron (ZOFRAN) IV  Time spent: 35 minutes  Author: Gillis Santa. MD [facility] 12/16/2023 1:00 PM  To reach On-call, see care teams to locate the attending and reach out to them via [email or web address]. If 7PM-7AM, please contact night-coverage If you still have difficulty reaching the attending provider, please page the [facility] (Director on Call) for [facility] on amion for assistance.

## 2023-12-16 NOTE — Evaluation (Signed)
Occupational Therapy Evaluation Patient Details Name: MORGIN DAIGRE MRN: [id number] DOB: 06-08-1929 Today's Date: 12/16/2023   History of Present Illness TERRI PELE is a 89 y.o. female with medical history significant of melanoma of scalp status post radiation and chemotherapy, anxiety/depression, basal cell carcinoma, HLD, presented with generalized weakness and fall.     Patient ate some chicken pot pie for dinner and then woke up sometime after midnight started to have vomiting x 3-4 times of stomach content, but denied any diarrhea no abdominal pain this morning, patient woke up this morning feeling dizziness when standing up and rising up, meantime feeling bilateral lower extremity weakness and fell and sliding down to the floor denied any LOC, no head or neck injury.   Clinical Impression   Patient received for OT evaluation. See flowsheet below for details of function. Generally, patient requiring SBA for bed mobility (only rolling and bridging in bed; unable to sit EOB 2/2 pt dizziness), unable to attempt functional mobility (2/2 dizziness), and overall MIN-MOD A for ADLs bed level. Currently it appears that per primary barrier is dizziness even with raising the head of the bed. Patient will benefit from continued OT while in acute care.       If plan is discharge home, recommend the following: A lot of help with walking and/or transfers;A lot of help with bathing/dressing/bathroom;Assistance with cooking/housework;Direct supervision/assist for medications management;Direct supervision/assist for financial management;Assist for transportation;Help with stairs or ramp for entrance    Functional Status Assessment  Patient has had a recent decline in their functional status and demonstrates the ability to make significant improvements in function in a reasonable and predictable amount of time.  Equipment Recommendations  Other (comment) (defer to next venue of care)    Recommendations  for Other Services       Precautions / Restrictions Precautions Precautions: Fall Restrictions Weight Bearing Restrictions Per Provider Order: No      Mobility Bed Mobility Overal bed mobility: Needs Assistance Bed Mobility: Rolling Rolling: Supervision, Used rails         General bed mobility comments: Able to roll with cues and rails; able to bridge for placement of bedpan. Pt only tolerated HOB to 45 degrees; any higher upright and she would c/o feeling very dizzy; unable to attempt sitting EOB for this reason.    Transfers                   General transfer comment: unable to attempt t/f to EOB or standing 2/2 dizziness      Balance Overall balance assessment: History of Falls                                         ADL either performed or assessed with clinical judgement   ADL Overall ADL's : Needs assistance/impaired Eating/Feeding: Set up Eating/Feeding Details (indicate cue type and reason): set up for drinking some Ensure via straw; did not want to drink much; not tolerating HOB greater than 45 degrees 2/2 dizziness.               Upper Body Dressing Details (indicate cue type and reason): Pt asking OT for assistance with removing sweater, as it was stuck on R arm 2/2 IV in R elbow; OT carefully removed it as to not disturb IV; pt able to assist by holding arm up.  Toilet Transfer Details (indicate cue type and reason): not safe to t/f 2/2 dizziness with any positional change; able to use bedpan with OT assisting. Toileting- Clothing Manipulation and Hygiene: Supervision/safety;Bed level Toileting - Clothing Manipulation Details (indicate cue type and reason): pt able to bridge to pull down pants prior to OT placing bedpan under her (she stated she had to urinate); OT handed her a wet wipe after she urinated and she was able to perform hygiene for urine.       General ADL Comments: Pt's current primary limitation appears to  be dizziness causing inability to attempt sitting EOB.     Vision Baseline Vision/History: 1 Wears glasses Ability to See in Adequate Light: 0 Adequate Patient Visual Report: No change from baseline (has glasses in room; able to read words on paper without difficulty)       Perception         Praxis         Pertinent Vitals/Pain Pain Assessment Pain Assessment: No/denies pain     Extremity/Trunk Assessment Upper Extremity Assessment Upper Extremity Assessment: Overall WFL for tasks assessed   Lower Extremity Assessment Lower Extremity Assessment: Overall WFL for tasks assessed (able to bridge in bed for placement of bedpan.)       Communication Communication Communication: Hearing impairment (Able to follow commands via written text) Cueing Techniques: Gestural cues;Other (comments) (writing on paper)   Cognition Arousal: Alert Behavior During Therapy: WFL for tasks assessed/performed Overall Cognitive Status: Difficult to assess                                 General Comments: OT had to communicate with pt via gestures and questions written on paper as pt unable to hear OT (batteries dead). Pt unable to answer the question "what day is it today" but when asked the year she states "24". Is aware that she's in the hospital and is able to give info about her home set up and how she fell.     General Comments  Pt feels dizzy even with raising the HOB by 20 degrees. With HOB at 45 degrees, BP 134/60, O2 on room air 93%.    Exercises     Shoulder Instructions      Home Living Family/patient expects to be discharged to:: Private residence Living Arrangements: Alone (son lives nearby) Available Help at Discharge: Family;Personal care attendant Type of Home: House Home Access: Stairs to enter Secretary/administrator of Steps: 1   Home Layout: One level     Bathroom Shower/Tub: Walk-in shower         Home Equipment: Pharmacist, hospital (2  wheels);Grab bars - tub/shower;Hand held shower head   Additional Comments: Patient states that she has a "helper" who comes 9am-1pm 3 days/week to help with groceries, household tasks, driving pt to appointments, driving her to cemetery to see husband's grave, etc.      Prior Functioning/Environment Prior Level of Function : Needs assist             Mobility Comments: States she uses RW in the morning and as day progresses switches to cane. One fall endorsed just prior to admission; denies other falls. ADLs Comments: Typically (I) with BADLs. Assist for IADLs. Very hard of hearing; hearing aid batteries are currently not working; no family in room.        OT Problem List: Decreased activity tolerance;Impaired balance (sitting and/or standing);Other (comment) (  dizziness)      OT Treatment/Interventions: Self-care/ADL training;Therapeutic exercise;Therapeutic activities;Patient/family education    OT Goals(Current goals can be found in the care plan section) Acute Rehab OT Goals Patient Stated Goal: Dizziness to stop OT Goal Formulation: With patient Time For Goal Achievement: 12/30/23 Potential to Achieve Goals: Good ADL Goals Pt Will Perform Grooming: with modified independence;standing Pt Will Perform Lower Body Dressing: with modified independence;sit to/from stand Pt Will Transfer to Toilet: with modified independence;ambulating;regular height toilet  OT Frequency: Min 1X/week    Co-evaluation              AM-PAC OT "6 Clicks" Daily Activity     Outcome Measure Help from another person eating meals?: A Little Help from another person taking care of personal grooming?: A Little Help from another person toileting, which includes using toliet, bedpan, or urinal?: A Lot Help from another person bathing (including washing, rinsing, drying)?: A Lot Help from another person to put on and taking off regular upper body clothing?: A Little Help from another person to put on  and taking off regular lower body clothing?: A Little 6 Click Score: 16   End of Session Equipment Utilized During Treatment: Other (comment) (bedpan) Nurse Communication: Mobility status;Other (comment) (pt urinated)  Activity Tolerance: Other (comment) (Limited by dizziness) Patient left: in bed;with call bell/phone within reach  OT Visit Diagnosis: Dizziness and giddiness (R42)                Time: 7829-5621 OT Time Calculation (min): 31 min Charges:  OT General Charges $OT Visit: 1 Visit OT Evaluation $OT Eval Moderate Complexity: 1 Mod OT Treatments $Self Care/Home Management : 8-22 mins  Linward Foster, MS, OTR/L  Alvester Morin 12/16/2023, 3:25 PM

## 2023-12-16 NOTE — ED Notes (Signed)
Placed Pt on bed pan, Pt stated that moving her head to far in bed, she has intense dizziness and requests to lay flat. Pts hearing aids are also dead, Pt request calling her son to ask him to bring some batteries. This RN will call her son Harvie Heck. Also, called dietary in regards to not receiving patients 2x daily ensures at this time

## 2023-12-16 NOTE — Care Management Obs Status (Signed)
MEDICARE OBSERVATION STATUS NOTIFICATION   Patient Details  Name: STAV DELAROCA MRN: [id number] Date of Birth: 1929-11-07   Medicare Observation Status Notification Given:  Yes    Susa Simmonds, LCSWA 12/16/2023, 3:34 PM

## 2023-12-16 NOTE — Evaluation (Signed)
Clinical/Bedside Swallow Evaluation Patient Details  Name: Krystal Richardson MRN: [id number] Date of Birth: Jun 01, 1929  Today's Date: 12/16/2023 Time: SLP Start Time (ACUTE ONLY): 0950 SLP Stop Time (ACUTE ONLY): 1045 SLP Time Calculation (min) (ACUTE ONLY): 55 min  Past Medical History:  Past Medical History:  Diagnosis Date   Actinic keratosis 03/12/2019   mid post scalp melanoma graft scar   Basal cell carcinoma 08/04/2014   R forehead - excision 08/19/2014   BCC (basal cell carcinoma of skin) 02/20/2019   L dorsum foot   Hyperlipidemia    Melanoma (HCC) 2002   with chemo and rad tx   Personal history of chemotherapy 2003   MELANOMA   Personal history of radiation therapy 2002   MELANOMA   Stomach ulcer    Urethral caruncle    Past Surgical History:  Past Surgical History:  Procedure Laterality Date   BREAST EXCISIONAL BIOPSY Left 1986   EXCISIONAL - NEG   cataract     hemmriodectomy  1980   melanoma  2002   HPI:  Pt is a 89 y.o. female with medical history significant of melanoma of scalp status post radiation and chemotherapy, anxiety/depression(post loos of Husband 2 yrs ago), basal cell carcinoma, HLD, HOH w/out HAs, presented with generalized weakness and fall.  Patient ate some chicken pot pie for dinner and then woke up sometime after midnight started to have vomiting x 3-4 times of stomach content, but denied any diarrhea no abdominal pain this morning, patient woke up this morning feeling dizziness when standing up and rising up, meantime feeling bilateral lower extremity weakness and fell and sliding down to the floor denied any LOC, no head or neck injury.   CT Imaging: Lungs/Pleura: No suspicious pulmonary nodules. Normal pleural.  Airways normal. Clear.  Head CT: No acute intracranial process.    Assessment / Plan / Recommendation  Clinical Impression   Pt seen for BSE. pt awake, verbal. Severely HOH -- Son arriving w/ HA soon. Pt appeared min anxious about not  feeling well; stated "dizziness if i sit up too much". Much TLC and encouragement given b/f presenting po trials. 1 Son present.  On RA; afebrile. WBC WNL.  Pt appears to present w/ functional oropharyngeal phase swallowing w/ No oropharyngeal phase dysphagia noted, No neuromuscular deficits noted. Pt consumed po's w/ No overt, clinical s/s of aspiration during po trials.  Pt appears at reduced risk for aspiration following general aspiration precautions. However, pt does have challenging factors that could impact her oropharyngeal swallowing to include dizziness/discomfort(NSG aware), weakness, min anxious w/ illness currently, and advanced age. These factors can increase risk for aspiration, dysphagia as well as decreased oral intake overall.   Post careful, more upright positioning avoiding dizziness/nausea feelings, po trials were presented w/ pt helping to feed self. Pt consumed all consistencies w/ no overt coughing, decline in vocal quality, or change in respiratory presentation during/post trials. O2 sats 99% during. Oral phase appeared [facility] w/ timely bolus management, mastication, and control of bolus propulsion for A-P transfer for swallowing. Oral clearing achieved w/ all trial consistencies -- moistened, soft foods given. OM Exam appeared [facility] w/ no unilateral weakness noted. Speech Clear. Pt fed self w/ setup support.   Recommend continue a Regular consistency diet w/ well-Cut meats, moistened foods; Thin liquids -- carefully monitor straw use w/ sitting Upright when drinking, and pt should help to Hold Cup when drinking. Recommend general aspiration precautions, reduce distractions. Tray setup and support at meals as  needed. Pills WHOLE in Puree for easier swallowing, IF needed.  Education given on the above assessment including Pills in Puree; food consistencies and easy to eat options; general aspiration precautions to pt and Son. Pt appears at her swallowing Baseline w/ no further skilled ST  services indicated. NSG to reconsult if any new needs arise. MD/NSG updated, agreed. Recommend Dietician f/u for support. SLP Visit Diagnosis: Dysphagia, unspecified (R13.10)    Aspiration Risk   (reduced following general aspiration precautions)    Diet Recommendation   Thin;Age appropriate regular = continue a Regular consistency diet w/ well-Cut meats, moistened foods; Thin liquids -- carefully monitor straw use w/ sitting Upright when drinking, and pt should help to Hold Cup when drinking. Recommend general aspiration precautions, reduce distractions. Tray setup and support at meals as needed.  Medication Administration: Whole meds with liquid (1-2 at a time; or Whole in a Puree)    Other  Recommendations Recommended Consults:  (Dietician) Oral Care Recommendations: Oral care BID;Patient independent with oral care (support)    Recommendations for follow up therapy are one component of a multi-disciplinary discharge planning process, led by the attending physician.  Recommendations may be updated based on patient status, additional functional criteria and insurance authorization.  Follow up Recommendations No SLP follow up      Assistance Recommended at Discharge  FULL for now  Functional Status Assessment Patient has not had a recent decline in their functional status  Frequency and Duration  (n/a)   (n/a)       Prognosis Prognosis for improved oropharyngeal function: Good Barriers to Reach Goals:  (n/a) Barriers/Prognosis Comment: min anxious w/ illness currently      Swallow Study   General Date of Onset: 12/15/23 HPI: Pt is a 89 y.o. female with medical history significant of melanoma of scalp status post radiation and chemotherapy, anxiety/depression(post loos of Husband 2 yrs ago), basal cell carcinoma, HLD, HOH w/out HAs, presented with generalized weakness and fall.  Patient ate some chicken pot pie for dinner and then woke up sometime after midnight started to have  vomiting x 3-4 times of stomach content, but denied any diarrhea no abdominal pain this morning, patient woke up this morning feeling dizziness when standing up and rising up, meantime feeling bilateral lower extremity weakness and fell and sliding down to the floor denied any LOC, no head or neck injury.   CT Imaging: Lungs/Pleura: No suspicious pulmonary nodules. Normal pleural.  Airways normal. Clear.  Head CT: No acute intracranial process. Type of Study: Bedside Swallow Evaluation Previous Swallow Assessment: none Diet Prior to this Study: Regular;Thin liquids (Level 0) Temperature Spikes Noted: No (wbc 6.7) Respiratory Status: Room air History of Recent Intubation: No Behavior/Cognition: Alert;Cooperative;Pleasant mood;Distractible;Requires cueing (not feeling well) Oral Cavity Assessment: Within Functional Limits Oral Care Completed by SLP: Yes Oral Cavity - Dentition: Adequate natural dentition Vision: Functional for self-feeding Self-Feeding Abilities: Able to feed self;Needs assist;Needs set up (supported) Patient Positioning: Postural control adequate for testing (supported positioning as upright as pt could w/out dizziness; more on her side) Baseline Vocal Quality: Normal Volitional Cough: Strong Volitional Swallow: Able to elicit    Oral/Motor/Sensory Function Overall Oral Motor/Sensory Function: Within functional limits   Ice Chips Ice chips: Not tested   Thin Liquid Thin Liquid: Within functional limits Presentation: Self Fed;Straw (10 trials)    Nectar Thick Nectar Thick Liquid: Not tested   Honey Thick Honey Thick Liquid: Not tested   Puree Puree: Within functional limits Presentation: Spoon (fed;  4-5 trials)   Solid     Solid: Within functional limits Presentation: Spoon (fed; 4-5 trials)        Jerilynn Som, MS, CCC-SLP Speech Language Pathologist Rehab Services; [facility] -  [phone] (ascom) Tudor Chandley 12/16/2023,1:52 PM

## 2023-12-16 NOTE — Plan of Care (Signed)

## 2023-12-17 ENCOUNTER — Inpatient Hospital Stay: Payer: Medicare Other

## 2023-12-17 DIAGNOSIS — Z7189 Other specified counseling: Secondary | ICD-10-CM | POA: Diagnosis not present

## 2023-12-17 DIAGNOSIS — E872 Acidosis, unspecified: Secondary | ICD-10-CM | POA: Diagnosis not present

## 2023-12-17 DIAGNOSIS — A419 Sepsis, unspecified organism: Secondary | ICD-10-CM | POA: Diagnosis not present

## 2023-12-17 DIAGNOSIS — W19XXXA Unspecified fall, initial encounter: Secondary | ICD-10-CM | POA: Diagnosis not present

## 2023-12-17 LAB — D-DIMER, QUANTITATIVE: D-Dimer, Quant: 0.59 ug{FEU}/mL — ABNORMAL HIGH (ref 0.00–0.50)

## 2023-12-17 LAB — BASIC METABOLIC PANEL
Anion gap: 7 (ref 5–15)
BUN: 19 mg/dL (ref 8–23)
CO2: 19 mmol/L — ABNORMAL LOW (ref 22–32)
Calcium: 7.9 mg/dL — ABNORMAL LOW (ref 8.9–10.3)
Chloride: 104 mmol/L (ref 98–111)
Creatinine, Ser: 0.6 mg/dL (ref 0.44–1.00)
GFR, Estimated: >60 mL/min (ref >60)
Glucose, Bld: 112 mg/dL — ABNORMAL HIGH (ref 70–99)
Potassium: 3.4 mmol/L — ABNORMAL LOW (ref 3.5–5.1)
Sodium: 130 mmol/L — ABNORMAL LOW (ref 135–145)

## 2023-12-17 LAB — CBC
HCT: 32.8 % — ABNORMAL LOW (ref 36.0–46.0)
Hemoglobin: 11.7 g/dL — ABNORMAL LOW (ref 12.0–15.0)
MCH: 30.6 pg (ref 26.0–34.0)
MCHC: 35.7 g/dL (ref 30.0–36.0)
MCV: 85.9 fL (ref 80.0–100.0)
Platelets: 208 10*3/uL (ref 150–400)
RBC: 3.82 MIL/uL — ABNORMAL LOW (ref 3.87–5.11)
RDW: 14.3 % (ref 11.5–15.5)
WBC: 13.7 10*3/uL — ABNORMAL HIGH (ref 4.0–10.5)
nRBC: 0 % (ref 0.0–0.2)

## 2023-12-17 LAB — LACTIC ACID, PLASMA: Lactic Acid, Venous: 0.8 mmol/L (ref 0.5–1.9)

## 2023-12-17 LAB — BRAIN NATRIURETIC PEPTIDE: B Natriuretic Peptide: 941.2 pg/mL — ABNORMAL HIGH (ref 0.0–100.0)

## 2023-12-17 LAB — MAGNESIUM: Magnesium: 1.8 mg/dL (ref 1.7–2.4)

## 2023-12-17 LAB — URINE CULTURE

## 2023-12-17 LAB — PHOSPHORUS: Phosphorus: 2 mg/dL — ABNORMAL LOW (ref 2.5–4.6)

## 2023-12-17 MED ORDER — QUETIAPINE FUMARATE 25 MG PO TABS
12.5000 mg | ORAL_TABLET | Freq: Every evening | ORAL | Status: AC
  Administered 2023-12-17: 12.5 mg via ORAL
  Filled 2023-12-17: qty 1

## 2023-12-17 MED ORDER — MECLIZINE HCL 25 MG PO TABS: 25.0000 mg | ORAL_TABLET | Freq: Three times a day (TID) | ORAL | Status: DC | PRN

## 2023-12-17 MED ORDER — MIDODRINE HCL 5 MG PO TABS
10.0000 mg | ORAL_TABLET | Freq: Three times a day (TID) | ORAL | Status: DC
  Administered 2023-12-18 – 2023-12-19 (×6): 10 mg via ORAL
  Filled 2023-12-17 (×9): qty 2

## 2023-12-17 MED ORDER — MECLIZINE HCL 25 MG PO TABS
25.0000 mg | ORAL_TABLET | Freq: Once | ORAL | Status: AC
  Administered 2023-12-17: 25 mg via ORAL
  Filled 2023-12-17: qty 1

## 2023-12-17 MED ORDER — FUROSEMIDE 10 MG/ML IJ SOLN
40.0000 mg | Freq: Two times a day (BID) | INTRAMUSCULAR | Status: AC
  Administered 2023-12-17 – 2023-12-18 (×3): 40 mg via INTRAVENOUS
  Filled 2023-12-17 (×3): qty 4

## 2023-12-17 MED ORDER — FUROSEMIDE 10 MG/ML IJ SOLN
20.0000 mg | Freq: Once | INTRAMUSCULAR | Status: AC
  Administered 2023-12-17: 20 mg via INTRAVENOUS
  Filled 2023-12-17: qty 2

## 2023-12-17 MED ORDER — DEXTROSE 5 % IV SOLN
30.0000 mmol | Freq: Once | INTRAVENOUS | Status: AC
  Administered 2023-12-17: 30 mmol via INTRAVENOUS
  Filled 2023-12-17: qty 10

## 2023-12-17 MED ORDER — SODIUM BICARBONATE 650 MG PO TABS
650.0000 mg | ORAL_TABLET | Freq: Three times a day (TID) | ORAL | Status: AC
  Administered 2023-12-17 – 2023-12-19 (×6): 650 mg via ORAL
  Filled 2023-12-17 (×6): qty 1

## 2023-12-17 MED ORDER — HALOPERIDOL LACTATE 5 MG/ML IJ SOLN: 1.0000 mg | Freq: Four times a day (QID) | INTRAMUSCULAR | Status: DC | PRN

## 2023-12-17 MED ORDER — SODIUM CHLORIDE 1 G PO TABS
1.0000 g | ORAL_TABLET | Freq: Three times a day (TID) | ORAL | Status: AC
  Administered 2023-12-17 – 2023-12-22 (×15): 1 g via ORAL
  Filled 2023-12-17 (×15): qty 1

## 2023-12-17 MED ORDER — BLISTEX MEDICATED EX OINT
TOPICAL_OINTMENT | CUTANEOUS | Status: DC | PRN
  Filled 2023-12-17: qty 6.3

## 2023-12-17 NOTE — Evaluation (Signed)
Physical Therapy Evaluation Patient Details Name: Krystal Richardson MRN: [id number] DOB: [birth year] Today's Date: 12/17/2023  History of Present Illness  Krystal Richardson is a 89 y.o. female with medical history significant of melanoma of scalp status post radiation and chemotherapy, anxiety/depression, basal cell carcinoma, HLD, presented with generalized weakness and fall.     Patient ate some chicken pot pie for dinner and then woke up sometime after midnight started to have vomiting x 3-4 times of stomach content, but denied any diarrhea no abdominal pain this morning, patient woke up this morning feeling dizziness when standing up and rising up, meantime feeling bilateral lower extremity weakness and fell and sliding down to the floor denied any LOC, no head or neck injury.  Clinical Impression  Pt resting on entry, Harvie Heck (son #1) at bedside. Pt nervous about session fearful provoking dizziness- this anxiety and subsequent dizziness in session are limiting factors in session. No S/Sx of BPPV, no nystagmus seen at EOB. Pt coughing a bit when moving, nauseated at EOB with emesis bag. 4 BP taken in session, variety of activities and position, all essentially the same. Pt is 85% SpO2 on room in session, placed back on [place] as received, then increased when later still at 89% on received rate. HOH also presents some challenges, but speaking loudly, slowly, and in max proximity can achieve >75% of communication goals. Pt in bed at EOB, incrimental elevation HOB by 5-10 degrees until at 35 degrees (any HOB elevation triggers repeat anxiety). Pt not dizzy at EOS. Son reports pt not strong enough to return home in this condition, pt may benefit from STR stay at SNF to rehabilitate mobility/strength for eventual safe transition back to home.       If plan is discharge home, recommend the following: A lot of help with bathing/dressing/bathroom;A lot of help with walking and/or transfers;Help with stairs or ramp for  entrance   Can travel by private vehicle        Equipment Recommendations None recommended by PT  Recommendations for Other Services       Functional Status Assessment       Precautions / Restrictions Precautions Precautions: Fall Restrictions Weight Bearing Restrictions Per Provider Order: No      Mobility  Bed Mobility Overal bed mobility: Needs Assistance Bed Mobility: Supine to Sit, Sit to Supine Rolling: Min assist   Supine to sit: Min assist Sit to supine: Min assist   General bed mobility comments: difficult to assess due to severe fluctuations in motivational inhibiton from anxiety    Transfers Overall transfer level:  (pt too anxious to attempt, very distressed regarding her dizziness)                      Ambulation/Gait                  Stairs            Wheelchair Mobility     Tilt Bed    Modified Rankin (Stroke Patients Only)       Balance                                             Pertinent Vitals/Pain Pain Assessment Pain Assessment: No/denies pain    Home Living Family/patient expects to be discharged to:: Private residence Living Arrangements: Non-relatives/Friends Available Help at Discharge:  Family;Personal care attendant (2 sons live every close) Type of Home: House Home Access: Stairs to enter Entrance Stairs-Rails: None Entrance Stairs-Number of Steps: 2   Home Layout: One level Home Equipment: Pharmacist, hospital (2 wheels);Grab bars - tub/shower;Hand held shower head;Cane - single point Additional Comments: Patient states that she has a "helper" who comes 9am-1pm 3 days/week to help with groceries, household tasks, driving pt to appointments, driving her to cemetery to see husband's grave, etc.    Prior Function               Mobility Comments: States she uses RW in the morning and as day progresses switches to cane. One fall endorsed just prior to admission; denies  other falls. ADLs Comments: Typically (I) with BADLs. Assist for IADLs. Very hard of hearing; hearing aid batteries are currently not working; no family in room.     Extremity/Trunk Assessment                Communication      Cognition Arousal: Alert Behavior During Therapy: Flat affect, Anxious                                            General Comments      Exercises     Assessment/Plan    PT Assessment Patient needs continued PT services  PT Problem List Decreased strength;Decreased activity tolerance;Decreased balance;Decreased mobility;Decreased cognition;Decreased range of motion;Decreased knowledge of precautions       PT Treatment Interventions DME instruction;Gait training;Stair training;Functional mobility training;Therapeutic activities;Therapeutic exercise;Balance training;Patient/family education    PT Goals (Current goals can be found in the Care Plan section)  Acute Rehab PT Goals Patient Stated Goal: pt needs to be able to move better so she can get back home PT Goal Formulation: With family Time For Goal Achievement: 12/17/23 Potential to Achieve Goals: Good    Frequency Min 1X/week     Co-evaluation               AM-PAC PT "6 Clicks" Mobility  Outcome Measure Help needed turning from your back to your side while in a flat bed without using bedrails?: A Lot Help needed moving from lying on your back to sitting on the side of a flat bed without using bedrails?: A Lot Help needed moving to and from a bed to a chair (including a wheelchair)?: A Lot Help needed standing up from a chair using your arms (e.g., wheelchair or bedside chair)?: A Lot Help needed to walk in hospital room?: A Lot Help needed climbing 3-5 steps with a railing? : Total 6 Click Score: 11    End of Session Equipment Utilized During Treatment: Oxygen Activity Tolerance: Other (comment) (dizziness, anxiety) Patient left: in bed;with family/visitor  present Nurse Communication: Mobility status PT Visit Diagnosis: Muscle weakness (generalized) (M62.81);Other abnormalities of gait and mobility (R26.89)    Time: 9604-5409 PT Time Calculation (min) (ACUTE ONLY): 39 min   Charges:   PT Evaluation $PT Eval Moderate Complexity: 1 Mod PT Treatments $Therapeutic Activity: 8-22 mins PT General Charges $$ ACUTE PT VISIT: 1 Visit        9:11 AM, 12/17/23 Rosamaria Lints, PT, DPT Physical Therapist - [facility]  [phone] (ASCOM)   Kandyce Dieguez C 12/17/2023, 9:07 AM

## 2023-12-17 NOTE — Progress Notes (Signed)
[facility] Progress Note  Patient: Krystal Richardson    WGN:[id number]  DOA: 12/15/2023     Date of Service: the patient was seen and examined on 12/17/2023  Chief Complaint  Patient presents with   Emesis   Brief hospital course: Krystal Richardson is a 89 y.o. female with medical history significant of melanoma of scalp status post radiation and chemotherapy, anxiety/depression, basal cell carcinoma, HLD, presented with generalized weakness and fall.   Patient ate some chicken pot pie for dinner and then woke up sometime after midnight started to have vomiting x 3-4 times of stomach content, but denied any diarrhea no abdominal pain this morning, patient woke up this morning feeling dizziness when standing up and rising up, meantime feeling bilateral lower extremity weakness and fell and sliding down to the floor denied any LOC, no head or neck injury.   ED Course: Afebrile, nontachycardic nonhypotensive blood pressure 170/55 O2 saturation 100% on room air.  CT head showed no acute findings but irregular thickening of the calvarium and overlying scalp of the posterior frontal bone likely changes related to melanoma and treatment.  Blood work showed sodium 127, lactic acid 3.42.3, WBC 6.7.  UA showed WBC 21-50 with leukocytes   Patient was given IV bolus x 2 of total 2000 mL and ceftriaxone x 1 and started to feel better.  Assessment and Plan: # Near syncope most likely due to hypotension caused by dehydration. Patient presented with repeated episodes of vomiting at night after eating dinner. S/p IVF bolus given in the ED Patient was on maintenance IV fluid for hydration, developed hypoxia due to pulmonary edema overnight. DC'd IV fluid on 12/22 12/22 c/o dizziness and head swimming while sitting up, most likely positional. Started meclizine as needed   # Acute hypoxic respiratory failure due to pulmonary edema secondary to volume overload CXR shows pulmonary edema and mild bilateral  pleural effusion Started Lasix 40 mg IV twice daily for 2 days Monitor hydration status BNP 941 elevated, D-dimer 0.59 slightly early, less likely PE Soft blood pressure, started midodrine 10 mg p.o. 3 times daily with holding parameters so that we can diurese her with Lasix Follow TTE   # Elevated lactate level, Likely secondary to severe dehydration,  doubt sepsis although patient does have a UTI. Does not meet SIRS criteria. Repeat lactic acid level    # UTI, UA LE moderate, WBC 21-50, bacteria rare. -Continue ceftriaxone 1 g IV daily Follow urine culture growing E. coli and strep mitis/oralis, follow sensitivity report  # Metabolic acidosis, unknown cause Monitor bicarb  #Hypokalemia, repleted as IV K-Phos #Hypophosphatemia, Phos repleted. Monitor electrolytes and replete as needed.  # Isotonic hyponatremia, acute on chronic due to nutritional deficiency -Hypovolemic secondary to repeated nauseous vomiting at  night -Clinically patient appears to be on the dry side, despite 2 L of IV bolus, s/p IVF overnight - osmolality 281 wnl, urine osmolality 437 and urine sodium level 135.  And TSH 5.4 elevated  12/22 started sodium chloride tablet 1 g p.o. 3 times daily for 5 days Monitor BMP daily  # Hypothyroid, patient was on Synthroid 25 mcg p.o. daily Patient has stopped taking medications seems likely due to depression TSH 5.4 elevated Resumed Synthroid home dose.  # Depression As per patient's son her husband passed away 2 years ago since then she has been depressed. Patient has stopped her home medications. Resumed Cymbalta 30 mg p.o. daily, Remeron 30 mg p.o. nightly Follow psych consult.  #  Bilateral sensorineural deafness, patient uses hearing aids. Continue supportive care.   # Physical debility, presented with fall Follow PT and OT eval, patient may need placement  Overall poor prognosis, if condition deteriorates then she may qualify for hospice. Follow  palliative care for goals of care discussion.  Body mass index is 17.93 kg/m.  Interventions:  Diet: Regular diet DVT Prophylaxis: Subcutaneous Lovenox   Advance goals of care discussion: DNR-limited  Family Communication: family was present at bedside, at the time of interview.  Management plan discussed with patient's son at bedside. Patient has sensorineural deafness and having a hard time to listen and understand.   Disposition:  Pt is from Home, admitted with Fall, dehydration, UTI, still on IV antibiotics and IV fluids, risk of fall., which precludes a safe discharge. Discharge to SNF, TBD after PT/OT eval, when stable, may need 1-2 more days to improve.  Subjective: Overnight patient became hypoxic and she was placed on oxygen.  In the morning time patient was complaining of feeling shaking and head swimming, dizziness.  Patient has sensorineural deafness, using hearing aids.  Denied any shortness of breath, no chest pain or palpitation, no nasal complaints.   Physical Exam: General: NAD, lying comfortably Appear in no distress, affect appropriate Eyes: PERRLA ENT: Oral Mucosa Clear, moist, b/l SNHL (uses Hearing aids) Neck: no JVD,  Cardiovascular: S1 and S2 Present, no Murmur,  Respiratory: Equal air entry bilaterally, bibasilar crackles, no wheezing. Abdomen: Bowel Sound present, Soft and no tenderness,  Skin: no rashes Extremities: no Pedal edema, no calf tenderness Neurologic: without any new focal findings Gait not checked due to patient safety concerns  Vitals:   12/17/23 0726 12/17/23 0846 12/17/23 1304 12/17/23 1513  BP: (!) 129/57 (!) 139/50 (!) 146/97 (!) 109/44  Pulse: 67   77  Resp: 19   20  Temp: 98.5 F (36.9 C)   99.1 F (37.3 C)  TempSrc: Oral   Oral  SpO2: 94%   94%  Weight:      Height:        Intake/Output Summary (Last 24 hours) at 12/17/2023 1535 Last data filed at 12/17/2023 0600 Gross per 24 hour  Intake 897.87 ml  Output 67 ml   Net 830.87 ml   Filed Weights   12/15/23 1030 12/16/23 2124  Weight: 50.7 kg 45.9 kg    Data Reviewed: I have personally reviewed and interpreted daily labs, tele strips, imagings as discussed above. I reviewed all nursing notes, pharmacy notes, vitals, pertinent old records I have discussed plan of care as described above with RN and patient/family.  CBC: Recent Labs  Lab 12/15/23 1041 12/17/23 0411  WBC 6.7 13.7*  NEUTROABS 5.6  --   HGB 13.1 11.7*  HCT 37.6 32.8*  MCV 87.4 85.9  PLT 231 208   Basic Metabolic Panel: Recent Labs  Lab 12/15/23 1041 12/16/23 0609 12/17/23 0411  NA 127* 133* 130*  K 3.6 3.7 3.4*  CL 97* 109 104  CO2 18* 20* 19*  GLUCOSE 158* 97 112*  BUN 15 14 19   CREATININE 0.84 0.70 0.60  CALCIUM 9.1 8.3* 7.9*  MG  --   --  1.8  PHOS  --   --  2.0*    Studies: DG Chest Port 1 View Result Date: 12/17/2023 CLINICAL DATA:  141880 SOB (shortness of breath) [id number] EXAM: PORTABLE CHEST 1 VIEW COMPARISON:  12/15/2023 chest radiograph. FINDINGS: Stable cardiomediastinal silhouette with top-normal heart size. No pneumothorax. Small bilateral pleural effusions are  new. Mild pulmonary edema is new. IMPRESSION: New mild congestive heart failure with small bilateral pleural effusions. Electronically Signed   By: Delbert Phenix M.D.   On: 12/17/2023 09:21    Scheduled Meds:  aspirin EC  81 mg Oral Daily   DULoxetine  30 mg Oral Daily   enoxaparin (LOVENOX) injection  40 mg Subcutaneous Q24H   feeding supplement  237 mL Oral BID BM   furosemide  40 mg Intravenous BID   levothyroxine  25 mcg Oral QAC breakfast   mirtazapine  30 mg Oral QHS   pantoprazole  40 mg Oral Daily   sodium bicarbonate  650 mg Oral TID   sodium chloride  1 g Oral TID WC   Continuous Infusions:  cefTRIAXone (ROCEPHIN)  IV 1 g (12/17/23 0902)   potassium PHOSPHATE IVPB (in mmol) 30 mmol (12/17/23 1307)   PRN Meds: lip balm, meclizine, ondansetron **OR** ondansetron (ZOFRAN)  IV  Time spent: 55 minutes  Author: Gillis Santa. MD [facility] 12/17/2023 3:35 PM  To reach On-call, see care teams to locate the attending and reach out to them via [email or web address]. If 7PM-7AM, please contact night-coverage If you still have difficulty reaching the attending provider, please page the [facility] (Director on Call) for [facility] on amion for assistance.

## 2023-12-17 NOTE — Consult Note (Signed)
Consultation Note Date: 12/17/2023   Patient Name: Krystal Richardson  DOB: Mar 01, 1929  MRN: [id number]  Age / Sex: 89 y.o., female  PCP: Barbette Reichmann, MD Referring Physician: Gillis Santa, MD  Reason for Consultation: Establishing goals of care  HPI/Patient Profile: [name] is a 89 y.o. female with medical history significant of melanoma of scalp status post radiation and chemotherapy, anxiety/depression, basal cell carcinoma, HLD, presented with generalized weakness and fall.   Patient ate some chicken pot pie for dinner and then woke up sometime after midnight started to have vomiting x 3-4 times of stomach content, but denied any diarrhea no abdominal pain this morning, patient woke up this morning feeling dizziness when standing up and rising up, meantime feeling bilateral lower extremity weakness and fell and sliding down to the floor denied any LOC, no head or neck injury.  Clinical Assessment and Goals of Care: Notes, diagnostics, and labs reviewed.  In to see patient.  Patient is currently resting in the bed with no family at bedside.  She states she lives alone as she was widowed previously.  She states she has 2 sons who live in the area.  Patient advises that her son who lives next door Gaynelle Adu is her Environmental health practitioner.  She states she has completed advanced directives.  She states she has a cane and walker which she uses at baseline.  She states over the past months, ambulation has become more difficult.  She states she receives Meals on Wheels and states that she has a life alert.  She states she fell prior to this admission, and now has dizziness when trying to sit up. Patient advises as long as she is laying down she feels fine. She states she does not remember hitting her head and did not have issues with this prior to the fall.  Goals of care discussed.  She states her goal is to  remain in her home as long as possible.  She confirms DNR/DNI status, and would like to continue to treat the treatable.    SUMMARY OF RECOMMENDATIONS   DNR/DNI.  Continue to treat the treatable.  Prognosis:  Unable to determine       Primary Diagnoses: Present on Admission:  UTI (urinary tract infection)  Fall   I have reviewed the medical record, interviewed the patient and family, and examined the patient. The following aspects are pertinent.  Past Medical History:  Diagnosis Date   Actinic keratosis 03/12/2019   mid post scalp melanoma graft scar   Basal cell carcinoma 08/04/2014   R forehead - excision 08/19/2014   BCC (basal cell carcinoma of skin) 02/20/2019   L dorsum foot   Hyperlipidemia    Melanoma (HCC) 2002   with chemo and rad tx   Personal history of chemotherapy 2003   MELANOMA   Personal history of radiation therapy 2002   MELANOMA   Stomach ulcer    Urethral caruncle    Social History   Socioeconomic History   Marital status:  Widowed    Spouse name: Not on file   Number of children: Not on file   Years of education: Not on file   Highest education level: Not on file  Occupational History   Not on file  Tobacco Use   Smoking status: Never   Smokeless tobacco: Never  Vaping Use   Vaping status: Never Used  Substance and Sexual Activity   Alcohol use: No    Alcohol/week: 0.0 standard drinks of alcohol   Drug use: No   Sexual activity: Not on file  Other Topics Concern   Not on file  Social History Narrative   Not on file   Social Drivers of Health   Financial Resource Strain: Not on file  Food Insecurity: No Food Insecurity (12/16/2023)   Hunger Vital Sign    Worried About Running Out of Food in the Last Year: Never true    Ran Out of Food in the Last Year: Never true  Transportation Needs: No Transportation Needs (12/16/2023)   PRAPARE - Administrator, Civil Service (Medical): No    Lack of Transportation  (Non-Medical): No  Physical Activity: Not on file  Stress: Not on file  Social Connections: Not on file   Family History  Problem Relation Age of Onset   Kidney disease Neg Hx    Bladder Cancer Neg Hx    Prostate cancer Neg Hx    Breast cancer Neg Hx    Scheduled Meds:  aspirin EC  81 mg Oral Daily   DULoxetine  30 mg Oral Daily   enoxaparin (LOVENOX) injection  40 mg Subcutaneous Q24H   feeding supplement  237 mL Oral BID BM   furosemide  40 mg Intravenous BID   levothyroxine  25 mcg Oral QAC breakfast   mirtazapine  30 mg Oral QHS   pantoprazole  40 mg Oral Daily   sodium bicarbonate  650 mg Oral TID   sodium chloride  1 g Oral TID WC   Continuous Infusions:  cefTRIAXone (ROCEPHIN)  IV 1 g (12/17/23 0902)   potassium PHOSPHATE IVPB (in mmol) 30 mmol (12/17/23 1307)   PRN Meds:.lip balm, meclizine, ondansetron **OR** ondansetron (ZOFRAN) IV Medications Prior to Admission:  Prior to Admission medications   Medication Sig Start Date End Date Taking? Authorizing Provider  aspirin EC 81 MG tablet Take 1 tablet (81 mg total) by mouth daily. Patient not taking: Reported on 12/15/2023 08/14/21   Leroy Sea, MD  DULoxetine (CYMBALTA) 30 MG capsule Take 1 capsule (30 mg total) by mouth daily. 07/27/21 01/06/22  Lorin Glass, MD  feeding supplement (ENSURE ENLIVE / ENSURE PLUS) LIQD Take 237 mLs by mouth 2 (two) times daily between meals. 07/26/21   Lorin Glass, MD  levothyroxine (SYNTHROID, LEVOTHROID) 25 MCG tablet Take 25 mcg by mouth daily before breakfast. Patient not taking: Reported on 12/15/2023    [provider]  mirtazapine (REMERON SOL-TAB) 30 MG disintegrating tablet Take 1 tablet (30 mg total) by mouth at bedtime. Patient not taking: Reported on 12/15/2023 08/14/21   Leroy Sea, MD  mupirocin ointment (BACTROBAN) 2 % Apply 1 Application topically daily. Qd to wound on scalp Patient not taking: Reported on 12/15/2023 07/03/23   Willeen Niece, MD   pantoprazole (PROTONIX) 40 MG tablet Take 1 tablet (40 mg total) by mouth daily. Patient not taking: Reported on 01/06/2022 08/14/21   Leroy Sea, MD   Allergies  Allergen Reactions   Toprol Xl [Metoprolol Tartrate]  Review of Systems  All other systems reviewed and are negative.   Physical Exam Pulmonary:     Effort: Pulmonary effort is normal.  Neurological:     Mental Status: She is alert.     Vital Signs: BP (!) 146/97 (BP Location: Left Arm)   Pulse 67   Temp 98.5 F (36.9 C) (Oral)   Resp 19   Ht 5\' 3"  (1.6 m)   Wt 45.9 kg   SpO2 94%   BMI 17.93 kg/m  Pain Scale: 0-10   Pain Score: Asleep   SpO2: SpO2: 94 % O2 Device:SpO2: 94 % O2 Flow Rate: .O2 Flow Rate (L/min): 3 L/min  IO: Intake/output summary:  Intake/Output Summary (Last 24 hours) at 12/17/2023 1400 Last data filed at 12/17/2023 0600 Gross per 24 hour  Intake 897.87 ml  Output 67 ml  Net 830.87 ml    LBM: Last BM Date : 12/15/23 Baseline Weight: Weight: 50.7 kg Most recent weight: Weight: 45.9 kg       Signed by: Morton Stall, NP   Please contact Palliative Medicine Team phone at [phone] for questions and concerns.  For individual provider: See Loretha Stapler

## 2023-12-18 ENCOUNTER — Inpatient Hospital Stay
Admit: 2023-12-18 | Discharge: 2023-12-18 | Disposition: A | Discharge status: Home / Self Care | Payer: Medicare Other | Attending: Student | Admitting: Student

## 2023-12-18 DIAGNOSIS — E872 Acidosis, unspecified: Secondary | ICD-10-CM | POA: Diagnosis not present

## 2023-12-18 DIAGNOSIS — A419 Sepsis, unspecified organism: Secondary | ICD-10-CM | POA: Diagnosis not present

## 2023-12-18 DIAGNOSIS — R42 Dizziness and giddiness: Secondary | ICD-10-CM

## 2023-12-18 DIAGNOSIS — W19XXXA Unspecified fall, initial encounter: Secondary | ICD-10-CM | POA: Diagnosis not present

## 2023-12-18 LAB — BASIC METABOLIC PANEL
Anion gap: 10 (ref 5–15)
BUN: 16 mg/dL (ref 8–23)
CO2: 26 mmol/L (ref 22–32)
Calcium: 8 mg/dL — ABNORMAL LOW (ref 8.9–10.3)
Chloride: 99 mmol/L (ref 98–111)
Creatinine, Ser: 0.68 mg/dL (ref 0.44–1.00)
GFR, Estimated: >60 mL/min (ref >60)
Glucose, Bld: 94 mg/dL (ref 70–99)
Potassium: 2.7 mmol/L — CL (ref 3.5–5.1)
Sodium: 135 mmol/L (ref 135–145)

## 2023-12-18 LAB — MAGNESIUM: Magnesium: 1.8 mg/dL (ref 1.7–2.4)

## 2023-12-18 LAB — CBC
HCT: 33.5 % — ABNORMAL LOW (ref 36.0–46.0)
Hemoglobin: 11.9 g/dL — ABNORMAL LOW (ref 12.0–15.0)
MCH: 30.4 pg (ref 26.0–34.0)
MCHC: 35.5 g/dL (ref 30.0–36.0)
MCV: 85.5 fL (ref 80.0–100.0)
Platelets: 193 10*3/uL (ref 150–400)
RBC: 3.92 MIL/uL (ref 3.87–5.11)
RDW: 13.7 % (ref 11.5–15.5)
WBC: 10.7 10*3/uL — ABNORMAL HIGH (ref 4.0–10.5)
nRBC: 0 % (ref 0.0–0.2)

## 2023-12-18 LAB — PHOSPHORUS: Phosphorus: 2.3 mg/dL — ABNORMAL LOW (ref 2.5–4.6)

## 2023-12-18 MED ORDER — POTASSIUM CHLORIDE 20 MEQ PO PACK
40.0000 meq | PACK | Freq: Once | ORAL | Status: AC
  Administered 2023-12-18: 40 meq via ORAL
  Filled 2023-12-18: qty 2

## 2023-12-18 MED ORDER — POTASSIUM CHLORIDE 10 MEQ/100ML IV SOLN
10.0000 meq | INTRAVENOUS | Status: AC
Start: 2023-12-18 — End: 2023-12-18
  Administered 2023-12-18 (×4): 10 meq via INTRAVENOUS
  Filled 2023-12-18 (×3): qty 100

## 2023-12-18 NOTE — Progress Notes (Signed)
Progress Note   Patient: Krystal Richardson LOV:[id number] DOB: [birth year] DOA: 12/15/2023     2 DOS: the patient was seen and examined on 12/18/2023   Brief hospital course:  Krystal Richardson is a 89 y.o. female with medical history significant of melanoma of scalp status post radiation and chemotherapy, anxiety/depression, basal cell carcinoma, HLD, presented with generalized weakness and fall.   Patient ate some chicken pot pie for dinner and then woke up sometime after midnight started to have vomiting x 3-4 times of stomach content, but denied any diarrhea no abdominal pain this morning, patient woke up this morning feeling dizziness when standing up and rising up, meantime feeling bilateral lower extremity weakness and fell and sliding down to the floor denied any LOC, no head or neck injury.   ED Course: Afebrile, nontachycardic nonhypotensive blood pressure 170/55 O2 saturation 100% on room air.  CT head showed no acute findings but irregular thickening of the calvarium and overlying scalp of the posterior frontal bone likely changes related to melanoma and treatment.  Blood work showed sodium 127, lactic acid 3.42.3, WBC 6.7.  UA showed WBC 21-50 with leukocytes   Patient was given IV bolus x 2 of total 2000 mL and ceftriaxone x 1 and started to feel better.  Assessment and Plan: # Near syncope most likely due to hypotension caused by dehydration. Continue meclizine Has received IV fluids with resolution of dehydration   # Acute hypoxic respiratory failure due to pulmonary edema secondary to volume overload CXR shows pulmonary edema and mild bilateral pleural effusion Started Lasix 40 mg IV twice daily for 2 days Monitor hydration status BNP 941 elevated, D-dimer 0.59 slightly early, less likely PE Soft blood pressure, started midodrine 10 mg p.o. 3 times daily with holding parameters so that we can diurese her with Lasix Follow-up on echo   # Elevated lactate level, Likely secondary to  severe dehydration,  doubt sepsis although patient does have a UTI. Does not meet SIRS criteria.     # UTI, UA LE moderate, WBC 21-50, bacteria rare. Continue ceftriaxone to complete 5 days course   # Metabolic acidosis, unknown cause Monitor bicarb   #Hypokalemia, repleted as IV K-Phos #Hypophosphatemia, Phos repleted. Monitor electrolytes and replete as needed.   # Isotonic hyponatremia,-resolved Continue monitoring   # Hypothyroid, Continue Synthroid   # Depression As per patient's son her husband passed away 2 years ago since then she has been depressed. Patient has stopped her home medications. Continue Cymbalta 30 mg p.o. daily, Remeron 30 mg p.o. nightly    # Bilateral sensorineural deafness, patient uses hearing aids. Continue supportive care     # Physical debility, presented with fall Continue with PT OT   Diet: Regular diet DVT Prophylaxis: Subcutaneous Lovenox    Advance goals of care discussion: DNR-limited   Family Communication: No family present at bedside today     Subjective:  Patient seen and examined at bedside this morning Admits to improvement in her generalized weakness Potassium level noted to be low which is being repleted TOC working on placement She denied nausea vomiting chest pain or cough  Physical Exam:  General: NAD, lying comfortably Appear in no distress, affect appropriate Eyes: PERRLA ENT: Oral Mucosa Clear, moist, b/l SNHL (uses Hearing aids) Neck: no JVD,  Cardiovascular: S1 and S2 Present, no Murmur,  Respiratory: Equal air entry bilaterally, bibasilar crackles, no wheezing. Abdomen: Bowel Sound present, Soft and no tenderness,  Skin: no rashes Extremities: no Pedal  edema, no calf tenderness Neurologic: without any new focal findings Gait not checked due to patient safety concerns    Vitals:   12/17/23 2022 12/18/23 0442 12/18/23 0802 12/18/23 1548  BP: 138/71 (!) 127/50 133/60 131/60  Pulse: 70 (!) 58 85 73   Resp: 20 20 18 16   Temp: 98.3 F (36.8 C) 97.7 F (36.5 C) 98.1 F (36.7 C) 99.1 F (37.3 C)  TempSrc:      SpO2: 97% 96% 91% 97%  Weight:      Height:        Data Reviewed:    Latest Ref Rng & Units 12/18/2023    4:23 AM 12/17/2023    4:11 AM 12/16/2023    6:09 AM  BMP  Glucose 70 - 99 mg/dL 94  098  97   BUN 8 - 23 mg/dL 16  19  14    Creatinine 0.44 - 1.00 mg/dL 1.19  1.47  8.29   Sodium 135 - 145 mmol/L 135  130  133   Potassium 3.5 - 5.1 mmol/L 2.7  3.4  3.7   Chloride 98 - 111 mmol/L 99  104  109   CO2 22 - 32 mmol/L 26  19  20    Calcium 8.9 - 10.3 mg/dL 8.0  7.9  8.3        Latest Ref Rng & Units 12/18/2023    4:23 AM 12/17/2023    4:11 AM 12/15/2023   10:41 AM  CBC  WBC 4.0 - 10.5 K/uL 10.7  13.7  6.7   Hemoglobin 12.0 - 15.0 g/dL 56.2  13.0  86.5   Hematocrit 36.0 - 46.0 % 33.5  32.8  37.6   Platelets 150 - 400 K/uL 193  208  231     Author: Loyce Dys, MD 12/18/2023 4:27 PM  For on call review [email or web address].

## 2023-12-18 NOTE — [place] FL2 (Signed)
[place] MEDICAID FL2 LEVEL OF CARE FORM     IDENTIFICATION  Patient Name: Krystal Richardson Birthdate: [birth year] Sex: female Admission Date (Current Location): 12/15/2023  [facility] and [place] Number:  Chiropodist and Address:  [facility], [address] [place], [place] [address]      Provider Number: [id number]  Attending Physician Name and Address:  Loyce Dys, MD  Relative Name and Phone Number:  Krystal Richardson)  [phone]    Current Level of Care: Hospital Recommended Level of Care: Skilled Nursing Facility Prior Approval Number:    Date Approved/Denied:   PASRR Number: [id number]  Discharge Plan: SNF    Current Diagnoses: Patient Active Problem List   Diagnosis Date Noted   Dehydration 12/15/2023   Fall 12/15/2023   Chronic deep vein thrombosis (DVT) (HCC) 01/07/2022   GERD (gastroesophageal reflux disease) 01/07/2022   Mixed conductive and sensorineural hearing loss 01/06/2022   Protein-calorie malnutrition, severe 08/13/2021   UTI (urinary tract infection) 08/12/2021   Decreased oral intake 08/12/2021   Depression 08/12/2021   Generalized weakness 08/12/2021   Malnutrition of moderate degree 07/23/2021   Failure to thrive in adult 07/21/2021   Lab test positive for detection of COVID-19 virus 07/21/2021   Hyperlipidemia    Hyponatremia    Elevated troponin    Hypokalemia    Hypothyroidism    Multiple thyroid nodules 09/09/2015   Urethral caruncle 06/28/2015   Osteoporosis, post-menopausal 06/25/2015   Melanoma of scalp (HCC) 06/25/2015   HLD (hyperlipidemia) 06/25/2015    Orientation RESPIRATION BLADDER Height & Weight     Self, Situation, Time, Place  O2 Continent Weight: 101 lb 3.1 oz (45.9 kg) Height:  5\' 3"  (160 cm)  BEHAVIORAL SYMPTOMS/MOOD NEUROLOGICAL BOWEL NUTRITION STATUS      Continent    AMBULATORY STATUS COMMUNICATION OF NEEDS Skin   Extensive Assist Verbally Normal                        Personal Care Assistance Level of Assistance  Bathing, Feeding, Dressing Bathing Assistance: Maximum assistance Feeding assistance: Limited assistance Dressing Assistance: Limited assistance     Functional Limitations Info  Sight, Hearing, Speech Sight Info: Impaired Hearing Info: Impaired Speech Info: Adequate    SPECIAL CARE FACTORS FREQUENCY  PT (By licensed PT), OT (By licensed OT)     PT Frequency: 5 times a week OT Frequency: 5 times a week            Contractures Contractures Info: Not present    Additional Factors Info  Code Status, Allergies Code Status Info: DNR Allergies Info: Toprol Xl (Metoprolol Tartrate)           Current Medications (12/18/2023):  This is the current hospital active medication list Current Facility-Administered Medications  Medication Dose Route Frequency Provider Last Rate Last Admin   aspirin EC tablet 81 mg  81 mg Oral Daily Mikey College T, MD   81 mg at 12/18/23 0850   cefTRIAXone (ROCEPHIN) 1 g in sodium chloride 0.9 % 100 mL IVPB  1 g Intravenous Q24H Rosezetta Schlatter T, MD 200 mL/hr at 12/18/23 0858 1 g at 12/18/23 0858   DULoxetine (CYMBALTA) DR capsule 30 mg  30 mg Oral Daily Mikey College T, MD   30 mg at 12/18/23 0850   enoxaparin (LOVENOX) injection 40 mg  40 mg Subcutaneous Q24H Merryl Hacker, RPH   40 mg at 12/17/23 2125   feeding supplement (  ENSURE ENLIVE / ENSURE PLUS) liquid 237 mL  237 mL Oral BID BM Mikey College T, MD   237 mL at 12/18/23 0900   furosemide (LASIX) injection 40 mg  40 mg Intravenous BID Gillis Santa, MD   40 mg at 12/18/23 0850   haloperidol lactate (HALDOL) injection 1 mg  1 mg Intravenous Q6H PRN Gillis Santa, MD       levothyroxine (SYNTHROID) tablet 25 mcg  25 mcg Oral QAC breakfast Mikey College T, MD   25 mcg at 12/18/23 0865   lip balm (BLISTEX) ointment   Topical PRN Gillis Santa, MD   Given at 12/18/23 0710   meclizine (ANTIVERT) tablet 25 mg  25 mg Oral TID PRN Gillis Santa, MD        midodrine (PROAMATINE) tablet 10 mg  10 mg Oral TID WC Gillis Santa, MD   10 mg at 12/18/23 0850   mirtazapine (REMERON SOL-TAB) disintegrating tablet 30 mg  30 mg Oral QHS Mikey College T, MD   30 mg at 12/17/23 2125   ondansetron (ZOFRAN) tablet 4 mg  4 mg Oral Q6H PRN Mikey College T, MD       Or   ondansetron [facility]) injection 4 mg  4 mg Intravenous Q6H PRN Mikey College T, MD   4 mg at 12/17/23 0434   pantoprazole (PROTONIX) EC tablet 40 mg  40 mg Oral Daily Gillis Santa, MD   40 mg at 12/18/23 0850   potassium chloride 10 mEq in 100 mL IVPB  10 mEq Intravenous Q1 Hr x 4 Jawo, Modou L, NP   Stopped at 12/18/23 1010   sodium bicarbonate tablet 650 mg  650 mg Oral TID Gillis Santa, MD   650 mg at 12/18/23 0850   sodium chloride tablet 1 g  1 g Oral TID WC Gillis Santa, MD   1 g at 12/18/23 7846     Discharge Medications: Please see discharge summary for a list of discharge medications.  Relevant Imaging Results:  Relevant Lab Results:   Additional Information ss [id number]  Allena Katz, LCSW

## 2023-12-18 NOTE — Progress Notes (Signed)
Physical Therapy Treatment Patient Details Name: Krystal Richardson MRN: [id number] DOB: 1929-05-23 Today's Date: 12/18/2023   History of Present Illness Krystal Richardson is a 89 y.o. female with medical history significant of melanoma of scalp status post radiation and chemotherapy, anxiety/depression, basal cell carcinoma, HLD, presented with generalized weakness and fall.     Patient ate some chicken pot pie for dinner and then woke up sometime after midnight started to have vomiting x 3-4 times of stomach content, but denied any diarrhea no abdominal pain this morning, patient woke up this morning feeling dizziness when standing up and rising up, meantime feeling bilateral lower extremity weakness and fell and sliding down to the floor denied any LOC, no head or neck injury.    PT Comments  Pt is making gradual progress. Reports she is able to elevate HOB this date, however has not attempt OOB yet. Agreeable to sit at EOB for there-ex. Sporatic dizziness and very anxious with all mobility. Educated on benefits of mobility and will continue to progress as able.    If plan is discharge home, recommend the following: A lot of help with bathing/dressing/bathroom;A lot of help with walking and/or transfers;Help with stairs or ramp for entrance   Can travel by private vehicle     No  Equipment Recommendations  None recommended by PT    Recommendations for Other Services       Precautions / Restrictions Precautions Precautions: Fall Restrictions Weight Bearing Restrictions Per Provider Order: No     Mobility  Bed Mobility Overal bed mobility: Needs Assistance Bed Mobility: Supine to Sit, Sit to Supine     Supine to sit: Min assist Sit to supine: Min assist   General bed mobility comments: very hesitant and moves slowly. Once seated at EOB, occasional LOB requiring min assist for correction otherwise, able to maintain seated balance with B hand support on bed. O2 removed for session with O2  sats WNL, however pt requested to return O2 at end secodnary to anxiety    Transfers                   General transfer comment: unable to attempt secondary to severe dizziness with position change    Ambulation/Gait               General Gait Details: unable   Stairs             Wheelchair Mobility     Tilt Bed    Modified Rankin (Stroke Patients Only)       Balance Overall balance assessment: Needs assistance, History of Falls Sitting-balance support: Feet supported, Bilateral upper extremity supported Sitting balance-Leahy Scale: Fair                                      Cognition Arousal: Alert Behavior During Therapy: Flat affect, Anxious Overall Cognitive Status: Within Functional Limits for tasks assessed                                 General Comments: agreeable to session and pleasant        Exercises Other Exercises Other Exercises: seated ther-ex performed with cga including LAQ and AP. Also performed head turns up/down and B sides with increased dizziness noted. Other Exercises: discussed sitting at EOB multiple times a day to help  with habituation.    General Comments        Pertinent Vitals/Pain Pain Assessment Pain Assessment: No/denies pain    Home Living                          Prior Function            PT Goals (current goals can now be found in the care plan section) Acute Rehab PT Goals Patient Stated Goal: pt needs to be able to move better so she can get back home PT Goal Formulation: With family Time For Goal Achievement: 12/17/23 Potential to Achieve Goals: Good Progress towards PT goals: Progressing toward goals    Frequency    Min 1X/week      PT Plan      Co-evaluation              AM-PAC PT "6 Clicks" Mobility   Outcome Measure  Help needed turning from your back to your side while in a flat bed without using bedrails?: A Lot Help needed  moving from lying on your back to sitting on the side of a flat bed without using bedrails?: A Lot Help needed moving to and from a bed to a chair (including a wheelchair)?: Total Help needed standing up from a chair using your arms (e.g., wheelchair or bedside chair)?: Total Help needed to walk in hospital room?: Total Help needed climbing 3-5 steps with a railing? : Total 6 Click Score: 8    End of Session Equipment Utilized During Treatment: Oxygen Activity Tolerance: Patient tolerated treatment well Patient left: in bed;with bed alarm set Nurse Communication: Mobility status PT Visit Diagnosis: Muscle weakness (generalized) (M62.81);Other abnormalities of gait and mobility (R26.89)     Time: 1610-9604 PT Time Calculation (min) (ACUTE ONLY): 16 min  Charges:    $Therapeutic Activity: 8-22 mins PT General Charges $$ ACUTE PT VISIT: 1 Visit                     Krystal Richardson, PT, DPT, GCS [phone]    Krystal Richardson 12/18/2023, 3:16 PM

## 2023-12-18 NOTE — Progress Notes (Signed)
*  PRELIMINARY RESULTS* Echocardiogram 2D Echocardiogram has been performed.  Carolyne Fiscal 12/18/2023, 2:52 PM

## 2023-12-18 NOTE — Plan of Care (Signed)

## 2023-12-18 NOTE — TOC Initial Note (Signed)
Transition of Care [facility]) - Initial/Assessment Note    Patient Details  Name: Krystal Richardson MRN: [id number] Date of Birth: April 14, 1929  Transition of Care [facility]) CM/SW Contact:    Allena Katz, LCSW Phone Number: 12/18/2023, 10:55 AM  Clinical Narrative:    CSW spoke with pt at bedside to discuss SNF. Pt reports she lives alone and would like to return home. She realizes she hasn't walked with therapy and is agreeable to getting a medicare.gov list and having referrals sent out. CSW will start workup.  CSW has left medicare.gov list at bedside.             Expected Discharge Plan: Skilled Nursing Facility Barriers to Discharge: Continued Medical Work up   Patient Goals and CMS Choice Patient states their goals for this hospitalization and ongoing recovery are:: unsure very ambivalent about SNF CMS Medicare.gov Compare Post Acute Care list provided to:: Patient        Expected Discharge Plan and Services     Post Acute Care Choice: Skilled Nursing Facility Living arrangements for the past 2 months: Single Family Home                                      Prior Living Arrangements/Services Living arrangements for the past 2 months: Single Family Home Lives with:: Self Patient language and need for interpreter reviewed:: Yes Do you feel safe going back to the place where you live?: Yes      Need for Family Participation in Patient Care: Yes (Comment) Care giver support system in place?: Yes (comment)      Activities of Daily Living   ADL Screening (condition at time of admission) Independently performs ADLs?: No Does the patient have a NEW difficulty with bathing/dressing/toileting/self-feeding that is expected to last >3 days?: No Does the patient have a NEW difficulty with getting in/out of bed, walking, or climbing stairs that is expected to last >3 days?: No Does the patient have a NEW difficulty with communication that is expected to last >3 days?: No Is  the patient deaf or have difficulty hearing?: Yes Does the patient have difficulty seeing, even when wearing glasses/contacts?: No Does the patient have difficulty concentrating, remembering, or making decisions?: No  Permission Sought/Granted                  Emotional Assessment     Affect (typically observed): Accepting Orientation: : Oriented to Self, Oriented to Place, Oriented to  Time, Oriented to Situation Alcohol / Substance Use: Never Used    Admission diagnosis:  Lactic acidosis [E87.20] Dizziness [R42] Fall [W19.XXXA] Fall, initial encounter [id number]] Sepsis without acute organ dysfunction, due to unspecified organism [facility]) [A41.9] Patient Active Problem List   Diagnosis Date Noted   Dehydration 12/15/2023   Fall 12/15/2023   Chronic deep vein thrombosis (DVT) (HCC) 01/07/2022   GERD (gastroesophageal reflux disease) 01/07/2022   Mixed conductive and sensorineural hearing loss 01/06/2022   Protein-calorie malnutrition, severe 08/13/2021   UTI (urinary tract infection) 08/12/2021   Decreased oral intake 08/12/2021   Depression 08/12/2021   Generalized weakness 08/12/2021   Malnutrition of moderate degree 07/23/2021   Failure to thrive in adult 07/21/2021   Lab test positive for detection of COVID-19 virus 07/21/2021   Hyperlipidemia    Hyponatremia    Elevated troponin    Hypokalemia    Hypothyroidism    Multiple thyroid nodules 09/09/2015  Urethral caruncle 06/28/2015   Osteoporosis, post-menopausal 06/25/2015   Melanoma of scalp (HCC) 06/25/2015   HLD (hyperlipidemia) 06/25/2015   PCP:  Barbette Reichmann, MD Pharmacy:   [facility] [address] [place] - [address] [place] [address] Phone: [phone] Fax: [phone]  [facility] - [place], [place] - [address] [place] [address] Phone: [phone] Fax: [phone]     Social Drivers of Health (SDOH) Social History: SDOH Screenings   Food  Insecurity: No Food Insecurity (12/16/2023)  Housing: Unknown (12/16/2023)  Transportation Needs: No Transportation Needs (12/16/2023)  Utilities: Not At Risk (12/16/2023)  Tobacco Use: Low Risk  (12/16/2023)   SDOH Interventions:     Readmission Risk Interventions     No data to display

## 2023-12-19 DIAGNOSIS — R42 Dizziness and giddiness: Secondary | ICD-10-CM | POA: Diagnosis not present

## 2023-12-19 DIAGNOSIS — W19XXXA Unspecified fall, initial encounter: Secondary | ICD-10-CM | POA: Diagnosis not present

## 2023-12-19 DIAGNOSIS — A419 Sepsis, unspecified organism: Secondary | ICD-10-CM | POA: Diagnosis not present

## 2023-12-19 DIAGNOSIS — E872 Acidosis, unspecified: Secondary | ICD-10-CM | POA: Diagnosis not present

## 2023-12-19 DIAGNOSIS — Z7189 Other specified counseling: Secondary | ICD-10-CM | POA: Diagnosis not present

## 2023-12-19 LAB — ECHOCARDIOGRAM COMPLETE
AR max vel: 2.03 cm2
AV Area VTI: 2.12 cm2
AV Area mean vel: 1.9 cm2
AV Mean grad: 5 mm[Hg]
AV Peak grad: 9.2 mm[Hg]
Ao pk vel: 1.52 m/s
Area-P 1/2: 2.7 cm2
Calc EF: 65.6 %
Height: 63 in
MV VTI: 1.91 cm2
S' Lateral: 3 cm
Single Plane A2C EF: 65.6 %
Single Plane A4C EF: 62.9 %
Weight: 1619.06 [oz_av]

## 2023-12-19 LAB — CBC WITH DIFFERENTIAL/PLATELET
Abs Immature Granulocytes: 0.06 10*3/uL (ref 0.00–0.07)
Basophils Absolute: 0.1 10*3/uL (ref 0.0–0.1)
Basophils Relative: 1 %
Eosinophils Absolute: 0.1 10*3/uL (ref 0.0–0.5)
Eosinophils Relative: 1 %
HCT: 39 % (ref 36.0–46.0)
Hemoglobin: 13.5 g/dL (ref 12.0–15.0)
Immature Granulocytes: 1 %
Lymphocytes Relative: 21 %
Lymphs Abs: 2.2 10*3/uL (ref 0.7–4.0)
MCH: 30.1 pg (ref 26.0–34.0)
MCHC: 34.6 g/dL (ref 30.0–36.0)
MCV: 86.9 fL (ref 80.0–100.0)
Monocytes Absolute: 1.4 10*3/uL — ABNORMAL HIGH (ref 0.1–1.0)
Monocytes Relative: 13 %
Neutro Abs: 6.8 10*3/uL (ref 1.7–7.7)
Neutrophils Relative %: 63 %
Platelets: 234 10*3/uL (ref 150–400)
RBC: 4.49 MIL/uL (ref 3.87–5.11)
RDW: 13.9 % (ref 11.5–15.5)
WBC: 10.6 10*3/uL — ABNORMAL HIGH (ref 4.0–10.5)
nRBC: 0 % (ref 0.0–0.2)

## 2023-12-19 LAB — BASIC METABOLIC PANEL
Anion gap: 8 (ref 5–15)
BUN: 26 mg/dL — ABNORMAL HIGH (ref 8–23)
CO2: 29 mmol/L (ref 22–32)
Calcium: 8.4 mg/dL — ABNORMAL LOW (ref 8.9–10.3)
Chloride: 93 mmol/L — ABNORMAL LOW (ref 98–111)
Creatinine, Ser: 0.76 mg/dL (ref 0.44–1.00)
GFR, Estimated: >60 mL/min (ref >60)
Glucose, Bld: 106 mg/dL — ABNORMAL HIGH (ref 70–99)
Potassium: 3.5 mmol/L (ref 3.5–5.1)
Sodium: 130 mmol/L — ABNORMAL LOW (ref 135–145)

## 2023-12-19 NOTE — Progress Notes (Signed)
Physical Therapy Treatment Patient Details Name: Krystal Richardson MRN: [id number] DOB: [birth year] Today's Date: 12/19/2023   History of Present Illness Krystal Richardson is a 89 y.o. female with medical history significant of melanoma of scalp status post radiation and chemotherapy, anxiety/depression, basal cell carcinoma, HLD, presented with generalized weakness and fall.     Patient ate some chicken pot pie for dinner and then woke up sometime after midnight started to have vomiting x 3-4 times of stomach content, but denied any diarrhea no abdominal pain this morning, patient woke up this morning feeling dizziness when standing up and rising up, meantime feeling bilateral lower extremity weakness and fell and sliding down to the floor denied any LOC, no head or neck injury.    PT Comments  Pt received in Semi-Fowler's position and agreeable to therapy.  Pt required assistance with installing new batteries in her hearing aids, but once installed, pt was able to converse and respond more effectively.  Pt stated she wanted to attempt to get up and mobilize more today if possible.  Pt performed bed mobility with minA and utilized hand rails and HHA from therapist to come upright at EOB.  Pt elected to continue to use HHA to come upright into standing and was then able to take shortened steps over to the recliner.   Pt with one episode of instability and likely comes from anxiousness and fear of falling.  PT then assisted into the chair with all needs met and call bell within reach.     If plan is discharge home, recommend the following: A lot of help with bathing/dressing/bathroom;A lot of help with walking and/or transfers;Help with stairs or ramp for entrance   Can travel by private vehicle     No  Equipment Recommendations  None recommended by PT    Recommendations for Other Services       Precautions / Restrictions Precautions Precautions: Fall Restrictions Weight Bearing Restrictions Per  Provider Order: No     Mobility  Bed Mobility Overal bed mobility: Needs Assistance Bed Mobility: Supine to Sit, Sit to Supine     Supine to sit: Min assist Sit to supine: Min assist   General bed mobility comments: Pt wmuch more willing to attempt mobility this date.    Transfers Overall transfer level: Needs assistance Equipment used: 1 person hand held assist Transfers: Sit to/from Stand Sit to Stand: Min assist           General transfer comment: Pt with some dizziness, but onces it subsided, pt agreeable to mobility.    Ambulation/Gait Ambulation/Gait assistance: Min assist Gait Distance (Feet): 4 Feet Assistive device: 1 person hand held assist Gait Pattern/deviations: Narrow base of support       General Gait Details: pt with significant imbalance noted at times when attempting to move feet.  Pt likely to experience that more due to fear rather than actual weakness.   Stairs             Wheelchair Mobility     Tilt Bed    Modified Rankin (Stroke Patients Only)       Balance Overall balance assessment: Needs assistance, History of Falls Sitting-balance support: Feet supported, Bilateral upper extremity supported Sitting balance-Leahy Scale: Fair                                      Cognition Arousal: Alert Behavior During  Therapy: Flat affect, Anxious Overall Cognitive Status: Within Functional Limits for tasks assessed                                 General Comments: agreeable to session and pleasant        Exercises      General Comments        Pertinent Vitals/Pain Pain Assessment Pain Assessment: No/denies pain    Home Living                          Prior Function            PT Goals (current goals can now be found in the care plan section) Acute Rehab PT Goals Patient Stated Goal: pt needs to be able to move better so she can get back home PT Goal Formulation: With  family Time For Goal Achievement: 01/02/24 Potential to Achieve Goals: Good Progress towards PT goals: Goals updated    Frequency    Min 1X/week      PT Plan      Co-evaluation              AM-PAC PT "6 Clicks" Mobility   Outcome Measure  Help needed turning from your back to your side while in a flat bed without using bedrails?: A Lot Help needed moving from lying on your back to sitting on the side of a flat bed without using bedrails?: A Lot Help needed moving to and from a bed to a chair (including a wheelchair)?: A Lot Help needed standing up from a chair using your arms (e.g., wheelchair or bedside chair)?: A Lot Help needed to walk in hospital room?: A Lot Help needed climbing 3-5 steps with a railing? : Total 6 Click Score: 11    End of Session Equipment Utilized During Treatment: Oxygen Activity Tolerance: Patient tolerated treatment well Patient left: in chair;with call bell/phone within reach;with chair alarm set Nurse Communication: Mobility status PT Visit Diagnosis: Muscle weakness (generalized) (M62.81);Other abnormalities of gait and mobility (R26.89)     Time: 1100-1118 PT Time Calculation (min) (ACUTE ONLY): 18 min  Charges:    $Therapeutic Activity: 8-22 mins PT General Charges $$ ACUTE PT VISIT: 1 Visit                     Nolon Bussing, PT, DPT Physical Therapist - [facility]  12/19/23, 2:15 PM

## 2023-12-19 NOTE — TOC Progression Note (Signed)
Transition of Care [facility]) - Progression Note    Patient Details  Name: Krystal Richardson MRN: [id number] Date of Birth: 10/29/1929  Transition [facility]) CM/SW Contact  Allena Katz, LCSW Phone Number: 12/19/2023, 10:56 AM  Clinical Narrative:   CSW attempted to see patient at bedside to discuss rehab but patient asleep and unable to be woken up. CSW will follow back up later.    Expected Discharge Plan: Skilled Nursing Facility Barriers to Discharge: Continued Medical Work up  Expected Discharge Plan and Services     Post Acute Care Choice: Skilled Nursing Facility Living arrangements for the past 2 months: Single Family Home                                       Social Determinants of Health (SDOH) Interventions SDOH Screenings   Food Insecurity: No Food Insecurity (12/16/2023)  Housing: Unknown (12/16/2023)  Transportation Needs: No Transportation Needs (12/16/2023)  Utilities: Not At Risk (12/16/2023)  Tobacco Use: Low Risk  (12/16/2023)    Readmission Risk Interventions     No data to display

## 2023-12-19 NOTE — Plan of Care (Signed)

## 2023-12-19 NOTE — Care Management Important Message (Signed)
Important Message  Patient Details  Name: Krystal Richardson MRN: [id number] Date of Birth: 03-Dec-1929   Important Message Given:  Yes - Medicare IM     Sherilyn Banker 12/19/2023, 11:28 AM

## 2023-12-19 NOTE — Progress Notes (Signed)
Progress Note   Patient: Krystal Richardson FAO:[id number] DOB: [birth year] DOA: 12/15/2023     3 DOS: the patient was seen and examined on 12/19/2023    Brief hospital course:   Krystal Richardson is a 89 y.o. female with medical history significant of melanoma of scalp status post radiation and chemotherapy, anxiety/depression, basal cell carcinoma, HLD, presented with generalized weakness and fall.   Patient ate some chicken pot pie for dinner and then woke up sometime after midnight started to have vomiting x 3-4 times of stomach content, but denied any diarrhea no abdominal pain this morning, patient woke up this morning feeling dizziness when standing up and rising up, meantime feeling bilateral lower extremity weakness and fell and sliding down to the floor denied any LOC, no head or neck injury.   ED Course: Afebrile, nontachycardic nonhypotensive blood pressure 170/55 O2 saturation 100% on room air.  CT head showed no acute findings but irregular thickening of the calvarium and overlying scalp of the posterior frontal bone likely changes related to melanoma and treatment.  Blood work showed sodium 127, lactic acid 3.42.3, WBC 6.7.  UA showed WBC 21-50 with leukocytes   Patient was given IV bolus x 2 of total 2000 mL and ceftriaxone x 1 and started to feel better.   Assessment and Plan: # Near syncope most likely due to hypotension caused by dehydration. Continue meclizine Has received IV fluids with resolution of dehydration   # Acute hypoxic respiratory failure due to pulmonary edema secondary to volume overload CXR shows pulmonary edema and mild bilateral pleural effusion Started Lasix 40 mg IV twice daily for 2 days Monitor hydration status BNP 941 elevated, D-dimer 0.59 slightly early, less likely PE Soft blood pressure, started midodrine 10 mg p.o. 3 times daily with holding parameters so that we can diurese her with Lasix Follow-up on echo   # Elevated lactate level, Likely  secondary to severe dehydration,  doubt sepsis although patient does have a UTI. Does not meet SIRS criteria.     # UTI, UA LE moderate, WBC 21-50, bacteria rare. Continue ceftriaxone to complete 5 days course   # Metabolic acidosis, unknown cause Monitor bicarb   #Hypokalemia, repleted as IV K-Phos #Hypophosphatemia, Phos repleted. Monitor electrolytes and replete as needed.   # Isotonic hyponatremia,-resolved Continue monitoring     # Hypothyroid, Continue Synthroid   # Depression As per patient's son her husband passed away 2 years ago since then she has been depressed. Patient has stopped her home medications. Continue Cymbalta 30 mg p.o. daily, Remeron 30 mg p.o. nightly     # Bilateral sensorineural deafness, patient uses hearing aids. Continue supportive care     # Physical debility, presented with fall Continue with PT OT   Diet: Regular diet DVT Prophylaxis: Subcutaneous Lovenox    Advance goals of care discussion: DNR-limited   Family Communication: No family present at bedside today     Subjective:  Patient seen and examined at bedside this morning Appears sleepy today however arousable Denies any acute overnight events chest pain or cough Case managers working on placement  Physical Exam: General: NAD, lying comfortably Appear in no distress, affect appropriate Eyes: PERRLA ENT: Oral Mucosa Clear, moist, b/l SNHL (uses Hearing aids) Neck: no JVD,  Cardiovascular: S1 and S2 Present, no Murmur,  Respiratory: Equal air entry bilaterally, bibasilar crackles, no wheezing. Abdomen: Bowel Sound present, Soft and no tenderness,  Skin: no rashes Extremities: no Pedal edema, no calf tenderness Neurologic:  without any new focal findings Gait not checked due to patient safety concerns    Data Reviewed:    Latest Ref Rng & Units 12/19/2023    6:29 AM 12/18/2023    4:23 AM 12/17/2023    4:11 AM  BMP  Glucose 70 - 99 mg/dL 098  94  119   BUN 8 - 23  mg/dL 26  16  19    Creatinine 0.44 - 1.00 mg/dL 1.47  8.29  5.62   Sodium 135 - 145 mmol/L 130  135  130   Potassium 3.5 - 5.1 mmol/L 3.5  2.7  3.4   Chloride 98 - 111 mmol/L 93  99  104   CO2 22 - 32 mmol/L 29  26  19    Calcium 8.9 - 10.3 mg/dL 8.4  8.0  7.9        Latest Ref Rng & Units 12/19/2023    6:29 AM 12/18/2023    4:23 AM 12/17/2023    4:11 AM  CBC  WBC 4.0 - 10.5 K/uL 10.6  10.7  13.7   Hemoglobin 12.0 - 15.0 g/dL 13.0  86.5  78.4   Hematocrit 36.0 - 46.0 % 39.0  33.5  32.8   Platelets 150 - 400 K/uL 234  193  208     Vitals:   12/18/23 2004 12/19/23 0424 12/19/23 0755 12/19/23 1603  BP: (!) 171/66 129/67 132/65 123/68  Pulse: (!) 58 88 82 70  Resp: 18 20 14    Temp: (!) 97.5 F (36.4 C) 98 F (36.7 C) 98.6 F (37 C) 98.8 F (37.1 C)  TempSrc: Oral Oral    SpO2: 97% 98% 97% 98%  Weight:      Height:         Author: Loyce Dys, MD 12/19/2023 5:14 PM  For on call review [email or web address].

## 2023-12-19 NOTE — Progress Notes (Addendum)
Daily Progress Note   Patient Name: Krystal Richardson       Date: 12/19/2023 DOB: 04-10-1929  Age: 89 y.o. MRN#: [id number] Attending Physician: Loyce Dys, MD Primary Care Physician: Barbette Reichmann, MD Admit Date: 12/15/2023  Reason for Consultation/Follow-up: Establishing goals of care  Subjective: Notes and labs reviewed. In to see patient. She states she continues to have dizziness when sitting up, and has waves of nausea. She shares that she is grateful for her care.   Length of Stay: 3  Current Medications: Scheduled Meds:   aspirin EC  81 mg Oral Daily   DULoxetine  30 mg Oral Daily   enoxaparin (LOVENOX) injection  40 mg Subcutaneous Q24H   feeding supplement  237 mL Oral BID BM   levothyroxine  25 mcg Oral QAC breakfast   midodrine  10 mg Oral TID WC   mirtazapine  30 mg Oral QHS   pantoprazole  40 mg Oral Daily   sodium chloride  1 g Oral TID WC    Continuous Infusions:  cefTRIAXone (ROCEPHIN)  IV 1 g (12/19/23 0915)    PRN Meds: haloperidol lactate, lip balm, meclizine, ondansetron **OR** ondansetron (ZOFRAN) IV  Physical Exam Pulmonary:     Effort: Pulmonary effort is normal.  Neurological:     Mental Status: She is alert.             Vital Signs: BP 132/65 (BP Location: Left Arm)   Pulse 82   Temp 98.6 F (37 C)   Resp 14   Ht 5\' 3"  (1.6 m)   Wt 45.9 kg   SpO2 97%   BMI 17.93 kg/m  SpO2: SpO2: 97 % O2 Device: O2 Device: Nasal Cannula O2 Flow Rate: O2 Flow Rate (L/min): 3 L/min  Intake/output summary:  Intake/Output Summary (Last 24 hours) at 12/19/2023 0943 Last data filed at 12/19/2023 0047 Gross per 24 hour  Intake --  Output 550 ml  Net -550 ml   LBM: Last BM Date : 12/15/23 Baseline Weight: Weight: 50.7 kg Most recent weight:  Weight: 45.9 kg   Patient Active Problem List   Diagnosis Date Noted   Dehydration 12/15/2023   Fall 12/15/2023   Chronic deep vein thrombosis (DVT) (HCC) 01/07/2022   GERD (gastroesophageal reflux disease) 01/07/2022   Mixed conductive and sensorineural hearing  loss 01/06/2022   Protein-calorie malnutrition, severe 08/13/2021   UTI (urinary tract infection) 08/12/2021   Decreased oral intake 08/12/2021   Depression 08/12/2021   Generalized weakness 08/12/2021   Malnutrition of moderate degree 07/23/2021   Failure to thrive in adult 07/21/2021   Lab test positive for detection of COVID-19 virus 07/21/2021   Hyperlipidemia    Hyponatremia    Elevated troponin    Hypokalemia    Hypothyroidism    Multiple thyroid nodules 09/09/2015   Urethral caruncle 06/28/2015   Osteoporosis, post-menopausal 06/25/2015   Melanoma of scalp (HCC) 06/25/2015   HLD (hyperlipidemia) 06/25/2015    Palliative Care Assessment & Plan    Recommendations/Plan: Patient continues to experience dizziness and nausea. Medications in place for symptom management, and primary team continues work up. Goals set for DNR/DNI, continue to treat the treatable. Patient has indicated son would be surrogate decision maker. As goals are set, please reconsult if needs arise.   Code Status:    Code Status Orders  (From admission, onward)           Start     Ordered   12/15/23 1552  Do not attempt resuscitation (DNR)- Limited -Do Not Intubate (DNI)  Continuous       Question Answer Comment  If pulseless and not breathing No CPR or chest compressions.   In Pre-Arrest Conditions (Patient Is Breathing and Has A Pulse) Do not intubate. Provide all appropriate non-invasive medical interventions. Avoid ICU transfer unless indicated or required.   Consent: Discussion documented in EHR or advanced directives reviewed      12/15/23 1552           Code Status History     Date Active Date Inactive Code Status  Order ID Comments User Context   08/12/2021 0229 08/14/2021 1756 DNR [id number]  Andris Baumann, MD ED   07/21/2021 1634 07/26/2021 1951 DNR [id number]  Lorretta Harp, MD ED   07/21/2021 1515 07/21/2021 1634 Full Code [id number]  Lorretta Harp, MD ED      Advance Directive Documentation    Flowsheet Row Most Recent Value  Type of Advance Directive Healthcare Power of Attorney, Living will  Pre-existing out of facility DNR order (yellow form or pink MOST form) --  "MOST" Form in Place? --       Thank you for allowing the Palliative Medicine Team to assist in the care of this patient.   Morton Stall, NP  Please contact Palliative Medicine Team phone at [phone] for questions and concerns.

## 2023-12-20 DIAGNOSIS — A419 Sepsis, unspecified organism: Secondary | ICD-10-CM | POA: Diagnosis not present

## 2023-12-20 DIAGNOSIS — R42 Dizziness and giddiness: Secondary | ICD-10-CM | POA: Diagnosis not present

## 2023-12-20 DIAGNOSIS — E872 Acidosis, unspecified: Secondary | ICD-10-CM | POA: Diagnosis not present

## 2023-12-20 DIAGNOSIS — W19XXXA Unspecified fall, initial encounter: Secondary | ICD-10-CM | POA: Diagnosis not present

## 2023-12-20 LAB — CULTURE, BLOOD (ROUTINE X 2)
Culture: NO GROWTH
Culture: NO GROWTH
Special Requests: ADEQUATE
Special Requests: ADEQUATE

## 2023-12-20 LAB — CBC WITH DIFFERENTIAL/PLATELET
Abs Immature Granulocytes: 0.04 10*3/uL (ref 0.00–0.07)
Basophils Absolute: 0.1 10*3/uL (ref 0.0–0.1)
Basophils Relative: 1 %
Eosinophils Absolute: 0.2 10*3/uL (ref 0.0–0.5)
Eosinophils Relative: 2 %
HCT: 37.1 % (ref 36.0–46.0)
Hemoglobin: 12.8 g/dL (ref 12.0–15.0)
Immature Granulocytes: 0 %
Lymphocytes Relative: 20 %
Lymphs Abs: 1.8 10*3/uL (ref 0.7–4.0)
MCH: 30 pg (ref 26.0–34.0)
MCHC: 34.5 g/dL (ref 30.0–36.0)
MCV: 86.9 fL (ref 80.0–100.0)
Monocytes Absolute: 1.2 10*3/uL — ABNORMAL HIGH (ref 0.1–1.0)
Monocytes Relative: 13 %
Neutro Abs: 5.7 10*3/uL (ref 1.7–7.7)
Neutrophils Relative %: 64 %
Platelets: 239 10*3/uL (ref 150–400)
RBC: 4.27 MIL/uL (ref 3.87–5.11)
RDW: 13.9 % (ref 11.5–15.5)
WBC: 8.9 10*3/uL (ref 4.0–10.5)
nRBC: 0 % (ref 0.0–0.2)

## 2023-12-20 LAB — BASIC METABOLIC PANEL
Anion gap: 12 (ref 5–15)
BUN: 33 mg/dL — ABNORMAL HIGH (ref 8–23)
CO2: 26 mmol/L (ref 22–32)
Calcium: 8.5 mg/dL — ABNORMAL LOW (ref 8.9–10.3)
Chloride: 98 mmol/L (ref 98–111)
Creatinine, Ser: 0.69 mg/dL (ref 0.44–1.00)
GFR, Estimated: >60 mL/min (ref >60)
Glucose, Bld: 100 mg/dL — ABNORMAL HIGH (ref 70–99)
Potassium: 3.7 mmol/L (ref 3.5–5.1)
Sodium: 136 mmol/L (ref 135–145)

## 2023-12-20 MED ORDER — ORAL CARE MOUTH RINSE: 15.0000 mL | OROMUCOSAL | Status: DC | PRN

## 2023-12-20 MED ORDER — TRAMADOL HCL 50 MG PO TABS
50.0000 mg | ORAL_TABLET | Freq: Four times a day (QID) | ORAL | Status: DC | PRN
  Administered 2023-12-20 – 2023-12-21 (×2): 50 mg via ORAL
  Filled 2023-12-20 (×3): qty 1

## 2023-12-20 NOTE — Plan of Care (Signed)

## 2023-12-20 NOTE — Plan of Care (Addendum)

## 2023-12-20 NOTE — Progress Notes (Signed)
Progress Note   Patient: Krystal Richardson:[id number] DOB: [birth year] DOA: 12/15/2023     4 DOS: the patient was seen and examined on 12/20/2023      Brief hospital course:   ADELLYN Richardson is a 89 y.o. female with medical history significant of melanoma of scalp status post radiation and chemotherapy, anxiety/depression, basal cell carcinoma, HLD, presented with generalized weakness and fall.   Patient ate some chicken pot pie for dinner and then woke up sometime after midnight started to have vomiting x 3-4 times of stomach content, but denied any diarrhea no abdominal pain this morning, patient woke up this morning feeling dizziness when standing up and rising up, meantime feeling bilateral lower extremity weakness and fell and sliding down to the floor denied any LOC, no head or neck injury.   ED Course: Afebrile, nontachycardic nonhypotensive blood pressure 170/55 O2 saturation 100% on room air.  CT head showed no acute findings but irregular thickening of the calvarium and overlying scalp of the posterior frontal bone likely changes related to melanoma and treatment.  Blood work showed sodium 127, lactic acid 3.42.3, WBC 6.7.  UA showed WBC 21-50 with leukocytes   Assessment and Plan:  # Near syncope most likely due to hypotension caused by dehydration. Continue meclizine Has received IV fluids with resolution of dehydration   # Acute hypoxic respiratory failure due to pulmonary edema secondary to volume overload Mild diastolic dysfunction Echo showing EF 50 to 55% with grade 1 diastolic dysfunction CXR shows pulmonary edema and mild bilateral pleural effusion Started Lasix 40 mg IV twice daily for 2 days Monitor hydration status BNP 941 elevated, D-dimer 0.59 slightly early, less likely PE Soft blood pressure, started midodrine 10 mg p.o. 3 times daily with holding parameters Continue to monitor input and output    # Elevated lactate level, Likely secondary to severe  dehydration,  doubt sepsis although patient does have a UTI. Does not meet SIRS criteria.     # UTI Has completed 5 days course of antibiotics   # Metabolic acidosis, unknown cause Monitor bicarb   #Hypokalemia, repleted as IV K-Phos #Hypophosphatemia, Phos repleted. Monitor electrolytes and replete as needed.   # Isotonic hyponatremia,-resolved Continue monitoring     # Hypothyroid, Continue Synthroid   # Depression As per patient's son her husband passed away 2 years ago since then she has been depressed. Patient has stopped her home medications. Continue Cymbalta 30 mg p.o. daily, Remeron 30 mg p.o. nightly     # Bilateral sensorineural deafness, patient uses hearing aids. Continue supportive care     # Physical debility, presented with fall Continue with PT OT   Diet: Regular diet DVT Prophylaxis: Subcutaneous Lovenox    Advance goals of care discussion: DNR-limited   Family Communication: No family present at bedside today     Subjective:  Patient seen and examined at bedside this morning Able to engage in conversation Denied worsening nausea vomiting or abdominal pain   Physical Exam: General: NAD, lying comfortably Appear in no distress, affect appropriate Eyes: PERRLA ENT: Oral Mucosa Clear, moist, b/l SNHL (uses Hearing aids) Neck: no JVD,  Cardiovascular: S1 and S2 Present, no Murmur,  Respiratory: Equal air entry bilaterally, bibasilar crackles, no wheezing. Abdomen: Bowel Sound present, Soft and no tenderness,  Skin: no rashes Extremities: no Pedal edema, no calf tenderness Neurologic: without any new focal findings Gait not checked due to patient safety concerns    Data Reviewed:    Latest  Ref Rng & Units 12/20/2023    6:00 AM 12/19/2023    6:29 AM 12/18/2023    4:23 AM  CBC  WBC 4.0 - 10.5 K/uL 8.9  10.6  10.7   Hemoglobin 12.0 - 15.0 g/dL 16.1  09.6  04.5   Hematocrit 36.0 - 46.0 % 37.1  39.0  33.5   Platelets 150 - 400 K/uL 239   234  193        Latest Ref Rng & Units 12/20/2023    6:00 AM 12/19/2023    6:29 AM 12/18/2023    4:23 AM  BMP  Glucose 70 - 99 mg/dL 409  811  94   BUN 8 - 23 mg/dL 33  26  16   Creatinine 0.44 - 1.00 mg/dL 9.14  7.82  9.56   Sodium 135 - 145 mmol/L 136  130  135   Potassium 3.5 - 5.1 mmol/L 3.7  3.5  2.7   Chloride 98 - 111 mmol/L 98  93  99   CO2 22 - 32 mmol/L 26  29  26    Calcium 8.9 - 10.3 mg/dL 8.5  8.4  8.0      Vitals:   12/19/23 2000 12/20/23 0500 12/20/23 0828 12/20/23 1155  BP: 124/72 110/73 (!) 150/66 (!) 135/58  Pulse: 88 72 78   Resp:   16   Temp: 98.4 F (36.9 C) 97.8 F (36.6 C) 99.2 F (37.3 C)   TempSrc:   Oral   SpO2: 100% 100% 98%   Weight:      Height:         Author: Loyce Dys, MD 12/20/2023 3:18 PM  For on call review [email or web address].

## 2023-12-20 NOTE — TOC Progression Note (Signed)
Transition of Care [facility]) - Progression Note    Patient Details  Name: Krystal Richardson MRN: [id number] Date of Birth: 12-Apr-1929  [facility]) CM/SW Contact  Carmina Miller, LCSWA Phone Number: 12/20/2023, 9:03 AM  Clinical Narrative:     CSW spoke with pt's Carin Primrose, discussed bed offers and emailed them, will follow up for choice, provided Medicare.gov ratings site for reference.   Expected Discharge Plan: Skilled Nursing Facility Barriers to Discharge: Continued Medical Work up  Expected Discharge Plan and Services     Post Acute Care Choice: Skilled Nursing Facility Living arrangements for the past 2 months: Single Family Home                                       Social Determinants of Health (SDOH) Interventions SDOH Screenings   Food Insecurity: No Food Insecurity (12/16/2023)  Housing: Unknown (12/16/2023)  Transportation Needs: No Transportation Needs (12/16/2023)  Utilities: Not At Risk (12/16/2023)  Tobacco Use: Low Risk  (12/16/2023)    Readmission Risk Interventions     No data to display

## 2023-12-21 DIAGNOSIS — W19XXXA Unspecified fall, initial encounter: Secondary | ICD-10-CM | POA: Diagnosis not present

## 2023-12-21 DIAGNOSIS — A419 Sepsis, unspecified organism: Secondary | ICD-10-CM | POA: Diagnosis not present

## 2023-12-21 LAB — CBC WITH DIFFERENTIAL/PLATELET
Abs Immature Granulocytes: 0.05 10*3/uL (ref 0.00–0.07)
Basophils Absolute: 0.1 10*3/uL (ref 0.0–0.1)
Basophils Relative: 1 %
Eosinophils Absolute: 0.2 10*3/uL (ref 0.0–0.5)
Eosinophils Relative: 2 %
HCT: 36.5 % (ref 36.0–46.0)
Hemoglobin: 12.4 g/dL (ref 12.0–15.0)
Immature Granulocytes: 1 %
Lymphocytes Relative: 18 %
Lymphs Abs: 1.5 10*3/uL (ref 0.7–4.0)
MCH: 30.2 pg (ref 26.0–34.0)
MCHC: 34 g/dL (ref 30.0–36.0)
MCV: 88.8 fL (ref 80.0–100.0)
Monocytes Absolute: 1.1 10*3/uL — ABNORMAL HIGH (ref 0.1–1.0)
Monocytes Relative: 14 %
Neutro Abs: 5.2 10*3/uL (ref 1.7–7.7)
Neutrophils Relative %: 64 %
Platelets: 230 10*3/uL (ref 150–400)
RBC: 4.11 MIL/uL (ref 3.87–5.11)
RDW: 13.7 % (ref 11.5–15.5)
WBC: 8 10*3/uL (ref 4.0–10.5)
nRBC: 0 % (ref 0.0–0.2)

## 2023-12-21 LAB — BASIC METABOLIC PANEL
Anion gap: 10 (ref 5–15)
BUN: 26 mg/dL — ABNORMAL HIGH (ref 8–23)
CO2: 26 mmol/L (ref 22–32)
Calcium: 8.6 mg/dL — ABNORMAL LOW (ref 8.9–10.3)
Chloride: 96 mmol/L — ABNORMAL LOW (ref 98–111)
Creatinine, Ser: 0.68 mg/dL (ref 0.44–1.00)
GFR, Estimated: >60 mL/min (ref >60)
Glucose, Bld: 111 mg/dL — ABNORMAL HIGH (ref 70–99)
Potassium: 3.6 mmol/L (ref 3.5–5.1)
Sodium: 132 mmol/L — ABNORMAL LOW (ref 135–145)

## 2023-12-21 MED ORDER — LACTULOSE 10 GM/15ML PO SOLN
20.0000 g | Freq: Two times a day (BID) | ORAL | Status: DC | PRN
  Administered 2023-12-22: 20 g via ORAL
  Filled 2023-12-21: qty 30

## 2023-12-21 MED ORDER — ADULT MULTIVITAMIN W/MINERALS CH
1.0000 | ORAL_TABLET | Freq: Every day | ORAL | Status: DC
Start: 2023-12-21 — End: 2023-12-26
  Administered 2023-12-21 – 2023-12-26 (×6): 1 via ORAL
  Filled 2023-12-21 (×6): qty 1

## 2023-12-21 NOTE — Progress Notes (Signed)
Progress Note   Patient: Krystal Richardson:[id number] DOB: [birth year] DOA: 12/15/2023     5 DOS: the patient was seen and examined on 12/21/2023     Brief hospital course:   From HPI "Krystal Richardson is a 89 y.o. female with medical history significant of melanoma of scalp status post radiation and chemotherapy, anxiety/depression, basal cell carcinoma, HLD, presented with generalized weakness and fall.   Patient ate some chicken pot pie for dinner and then woke up sometime after midnight started to have vomiting x 3-4 times of stomach content, but denied any diarrhea no abdominal pain this morning, patient woke up this morning feeling dizziness when standing up and rising up, meantime feeling bilateral lower extremity weakness and fell and sliding down to the floor denied any LOC, no head or neck injury.   ED Course: Afebrile, nontachycardic nonhypotensive blood pressure 170/55 O2 saturation 100% on room air.  CT head showed no acute findings but irregular thickening of the calvarium and overlying scalp of the posterior frontal bone likely changes related to melanoma and treatment.  Blood work showed sodium 127, lactic acid 3.42.3, WBC 6.7.  UA showed WBC 21-50 with leukocytes "   Assessment and Plan:   # Near syncope most likely due to hypotension caused by dehydration. Continue meclizine Has received IV fluids with resolution of dehydration   # Acute hypoxic respiratory failure due to pulmonary edema secondary to volume overload Mild diastolic dysfunction Echo showing EF 50 to 55% with grade 1 diastolic dysfunction CXR shows pulmonary edema and mild bilateral pleural effusion Started Lasix 40 mg IV twice daily for 2 days Monitor hydration status BNP 941 elevated, D-dimer 0.59 slightly early, less likely PE Soft blood pressure, started midodrine 10 mg p.o. 3 times daily with holding parameters Continue to monitor input and output     # Elevated lactate level, Likely secondary to  severe dehydration,  doubt sepsis although patient does have a UTI. Does not meet SIRS criteria.     # UTI Has completed 5 days course of antibiotics   # Metabolic acidosis, unknown cause Monitor bicarb as needed   #Hypokalemia, repleted as IV K-Phos #Hypophosphatemia, Phos repleted. Monitor electrolytes and replete as needed.   # Isotonic hyponatremia,-resolved Continue monitoring     # Hypothyroid, Continue Synthroid   # Depression As per patient's son her husband passed away 2 years ago since then she has been depressed. Patient has stopped her home medications. Continue Cymbalta 30 mg p.o. daily, Remeron 30 mg p.o. nightly     # Bilateral sensorineural deafness, patient uses hearing aids. Continue supportive care     # Physical debility, presented with fall Continue with PT OT   Diet: Regular diet DVT Prophylaxis: Subcutaneous Lovenox    Advance goals of care discussion: DNR-limited   Family Communication: No family present at bedside today     Subjective:  Patient seen and examined at bedside this morning Denies cough nausea or vomiting TOC working on placement   Physical Exam: General: NAD, lying comfortably Appear in no distress, affect appropriate Eyes: PERRLA ENT: Oral Mucosa Clear, moist, b/l SNHL (uses Hearing aids) Neck: no JVD,  Cardiovascular: S1 and S2 Present, no Murmur,  Respiratory: Equal air entry bilaterally, bibasilar crackles, no wheezing. Abdomen: Bowel Sound present, Soft and no tenderness,  Skin: no rashes Extremities: no Pedal edema, no calf tenderness Neurologic: without any new focal findings Gait not checked due to patient safety concerns    Data Reviewed:  Latest Ref Rng & Units 12/21/2023    5:01 AM 12/20/2023    6:00 AM 12/19/2023    6:29 AM  CBC  WBC 4.0 - 10.5 K/uL 8.0  8.9  10.6   Hemoglobin 12.0 - 15.0 g/dL 38.7  56.4  33.2   Hematocrit 36.0 - 46.0 % 36.5  37.1  39.0   Platelets 150 - 400 K/uL 230  239  234         Latest Ref Rng & Units 12/21/2023    5:01 AM 12/20/2023    6:00 AM 12/19/2023    6:29 AM  BMP  Glucose 70 - 99 mg/dL 951  884  166   BUN 8 - 23 mg/dL 26  33  26   Creatinine 0.44 - 1.00 mg/dL 0.63  0.16  0.10   Sodium 135 - 145 mmol/L 132  136  130   Potassium 3.5 - 5.1 mmol/L 3.6  3.7  3.5   Chloride 98 - 111 mmol/L 96  98  93   CO2 22 - 32 mmol/L 26  26  29    Calcium 8.9 - 10.3 mg/dL 8.6  8.5  8.4     Vitals:   12/20/23 1958 12/21/23 0505 12/21/23 0736 12/21/23 1200  BP: (!) 135/53 (!) 145/68 (!) 140/67 (!) 131/56  Pulse: 81 85 82   Resp: 18 20 16    Temp: 98.3 F (36.8 C) 97.8 F (36.6 C) (!) 97.5 F (36.4 C)   TempSrc:      SpO2: 98% 96% 98%   Weight:      Height:       Disposition: Pending skilled nursing facility placement  Author: Loyce Dys, MD 12/21/2023 2:34 PM  For on call review [email or web address].

## 2023-12-21 NOTE — Progress Notes (Signed)
Initial Nutrition Assessment  DOCUMENTATION CODES:   Underweight  INTERVENTION:   -Continue regular diet -Continue Ensure Enlive po BID, each supplement provides 350 kcal and 20 grams of protein -MVI with minerals daily  NUTRITION DIAGNOSIS:   Increased nutrient needs related to acute illness as evidenced by estimated needs.  GOAL:   Patient will meet greater than or equal to 90% of their needs  MONITOR:   PO intake, Supplement acceptance  REASON FOR ASSESSMENT:   Malnutrition Screening Tool    ASSESSMENT:   Pt with medical history significant of melanoma of scalp status post radiation and chemotherapy, anxiety/depression, basal cell carcinoma, HLD, presented with generalized weakness and fall.  Pt admitted with UTI, near syncope, and dehydration.  12/21- s/p BSE- regular diet  Reviewed I/O's: +818 ml since admission   Pt unavailable at time of visit. Attempted to speak with pt via call to hospital room phone, however, unable to reach. RD unable to obtain further nutrition-related history or complete nutrition-focused physical exam at this time.    Per H&P, pt has had depression since pt's husband passed away 2 years ago. Suspect this may account for weight loss and history of poor oral intake. Pt consumed chicken pot pie for dinner 1 day PTA and experienced 3-4 nausea and vomiting 3-4 times that morning, as well as dizziness.   Pt currently on a regular diet. Noted meal completions 100%. Pt is also drinking Ensure supplements.   Reviewed wt hx; pt has experienced a 1.5% wt loss over the past 11 months, which is not significant for time frame.   Highly suspect malnutrition given multiple co-morbidities, underweight status, and advanced age. However, RD unable to identify at this time without completion of nutrition-focused physical exam. Pt with history of severe malnutrition from previous admissions; RD suspects this is ongoing.   Palliative care following for goals  of care discussions; plan to treat the treatable and DNR/DNI. Per TOC notes, plan for SNF placement at discharge.   Medications reviewed and include lovenox, protamine, remeron, protonix, and sodium chloride.   Labs reviewed: Na: 132, Phos: 2.3, CBGS: 100 (inpatient orders for glycemic control are none).    Diet Order:   Diet Order             Diet regular Room service appropriate? Yes with Assist; Fluid consistency: Thin  Diet effective now                   EDUCATION NEEDS:   No education needs have been identified at this time  Skin:  Skin Assessment: Skin Integrity Issues: Skin Integrity Issues:: Other (Comment) Other: healed identation on upper head s/p radiation  Last BM:  12/15/23  Height:   Ht Readings from Last 1 Encounters:  12/16/23 5\' 3"  (1.6 m)    Weight:   Wt Readings from Last 1 Encounters:  12/16/23 45.9 kg    Ideal Body Weight:  52.3 kg  BMI:  Body mass index is 17.93 kg/m.  Estimated Nutritional Needs:   Kcal:  1400-1600  Protein:  70-85 grams  Fluid:  > 1.4 L    Levada Schilling, RD, LDN, CDCES Registered Dietitian III Certified Diabetes Care and Education Specialist If unable to reach this RD, please use "RD Inpatient" group chat on secure chat between hours of 8am-4 pm daily

## 2023-12-21 NOTE — Plan of Care (Addendum)
Patient is alert and oriented. Pt has been not eating and drinking well.she can able to take her pills whole  with applesauce.  Patient does not have any bowel movements since 12/20. Md Made aware. Received  new order but She refused laxatives. Educated her about the laxatives.Denies any pain.  Problem: Education: Goal: Knowledge of General Education information will improve Description: Including pain rating scale, medication(s)/side effects and non-pharmacologic comfort measures Outcome: Progressing   Problem: Health Behavior/Discharge Planning: Goal: Ability to manage health-related needs will improve Outcome: Progressing   Problem: Clinical Measurements: Goal: Ability to maintain clinical measurements within normal limits will improve Outcome: Progressing Goal: Will remain free from infection Outcome: Progressing Goal: Diagnostic test results will improve Outcome: Progressing Goal: Respiratory complications will improve Outcome: Progressing Goal: Cardiovascular complication will be avoided Outcome: Progressing   Problem: Activity: Goal: Risk for activity intolerance will decrease Outcome: Progressing   Problem: Nutrition: Goal: Adequate nutrition will be maintained Outcome: Progressing   Problem: Coping: Goal: Level of anxiety will decrease Outcome: Progressing   Problem: Elimination: Goal: Will not experience complications related to bowel motility Outcome: Progressing Goal: Will not experience complications related to urinary retention Outcome: Progressing   Problem: Pain Management: Goal: General experience of comfort will improve Outcome: Progressing   Problem: Safety: Goal: Ability to remain free from injury will improve Outcome: Progressing   Problem: Skin Integrity: Goal: Risk for impaired skin integrity will decrease Outcome: Progressing

## 2023-12-22 DIAGNOSIS — A419 Sepsis, unspecified organism: Secondary | ICD-10-CM | POA: Diagnosis not present

## 2023-12-22 DIAGNOSIS — W19XXXD Unspecified fall, subsequent encounter: Secondary | ICD-10-CM | POA: Diagnosis not present

## 2023-12-22 MED ORDER — MIDODRINE HCL 5 MG PO TABS
5.0000 mg | ORAL_TABLET | Freq: Three times a day (TID) | ORAL | Status: DC
  Administered 2023-12-22 – 2023-12-23 (×3): 5 mg via ORAL
  Filled 2023-12-22 (×3): qty 1

## 2023-12-22 NOTE — Plan of Care (Signed)
  Problem: Health Behavior/Discharge Planning: Goal: Ability to manage health-related needs will improve Outcome: Progressing   Problem: Clinical Measurements: Goal: Will remain free from infection Outcome: Progressing Goal: Respiratory complications will improve Outcome: Progressing   Problem: Activity: Goal: Risk for activity intolerance will decrease Outcome: Progressing

## 2023-12-22 NOTE — TOC Progression Note (Signed)
Transition of Care [facility]) - Progression Note    Patient Details  Name: Krystal Richardson MRN: [id number] Date of Birth: 11-Nov-1929  Transition of Care [facility]) CM/SW Contact  Allena Katz, LCSW Phone Number: 12/22/2023, 2:23 PM  Clinical Narrative:   CSW spoke with robbie to offer choice on hospice. He states no preference. Referral give to [facility].    Expected Discharge Plan: Skilled Nursing Facility Barriers to Discharge: Continued Medical Work up  Expected Discharge Plan and Services     Post Acute Care Choice: Skilled Nursing Facility Living arrangements for the past 2 months: Single Family Home                                       Social Determinants of Health (SDOH) Interventions SDOH Screenings   Food Insecurity: No Food Insecurity (12/16/2023)  Housing: Unknown (12/16/2023)  Transportation Needs: No Transportation Needs (12/16/2023)  Utilities: Not At Risk (12/16/2023)  Tobacco Use: Low Risk  (12/16/2023)    Readmission Risk Interventions     No data to display

## 2023-12-22 NOTE — Progress Notes (Signed)
PT Cancellation Note  Patient Details Name: Krystal Richardson MRN: [id number] DOB: [birth year]   Cancelled Treatment:    Reason Eval/Treat Not Completed: Fatigue/lethargy limiting ability to participate Patient sleeping soundly, family in room ask that I return later. Will re-attempt this pm as time allows.     [name] 12/22/2023, 11:32 AM

## 2023-12-22 NOTE — Progress Notes (Addendum)
[facility], Room 104 [facility] Liaison Note  Received request from Allena Katz, LCSW , Transitions of Care Manager, for hospice services at home after discharge.  Spoke with son and caregiver to initiate education related to hospice philosophy, services, and team approach to care.  Son and caregiver verbalized understanding of information given.  Per discussion, the plan is for discharge home by EMS on Monday.   DME needs discussed.  Patient has the following equipment in the home: walker and bsc. Patient/family requests the following equipment in the home: Hospital bed, over bed table, shower chair, wheelchair and home 02.   The address has been verified and is correct in the chart. Krystal Richardson and phone number [phone] is the family contact to arrange time of equipment delivery.    Please send signed and completed DNR home with the patient/family.  Please provide prescriptions at discharge as needed to ensure ongoing symptom management.   [facility] information and contact numbers given to son. Above information shared with Allena Katz, LCSW, Transitions of Care Manager.     Please call with any Hospice related questions or concerns.  Thank you for the opportunity to participate in this patient's care.  Redge Gainer, [facility] Liaison [phone]

## 2023-12-22 NOTE — Progress Notes (Addendum)
SATURATION QUALIFICATIONS: (This note is used to comply with regulatory documentation for home oxygen)  Patient Saturations on Room Air at Rest = 92%  Patient Saturations on Room Air while Ambulating = 87%  Patient Saturations on 2Liters of oxygen while Ambulating = 92%  Please briefly explain why patient needs home oxygen:

## 2023-12-22 NOTE — Progress Notes (Signed)
PROGRESS NOTE    Krystal Richardson  ZOX:[id number] DOB: [birth year] DOA: 12/15/2023 PCP: Barbette Reichmann, MD    Brief Narrative:   From HPI "Krystal Richardson is a 89 y.o. female with medical history significant of melanoma of scalp status post radiation and chemotherapy, anxiety/depression, basal cell carcinoma, HLD, presented with generalized weakness and fall.   Patient ate some chicken pot pie for dinner and then woke up sometime after midnight started to have vomiting x 3-4 times of stomach content, but denied any diarrhea no abdominal pain this morning, patient woke up this morning feeling dizziness when standing up and rising up, meantime feeling bilateral lower extremity weakness and fell and sliding down to the floor denied any LOC, no head or neck injury.   ED Course: Afebrile, nontachycardic nonhypotensive blood pressure 170/55 O2 saturation 100% on room air.  CT head showed no acute findings but irregular thickening of the calvarium and overlying scalp of the posterior frontal bone likely changes related to melanoma and treatment.  Blood work showed sodium 127, lactic acid 3.42.3, WBC 6.7.  UA showed WBC 21-50 with leukocytes "   Assessment & Plan:   Principal Problem:   Fall Active Problems:   UTI (urinary tract infection)   Dehydration # Near syncope most likely due to hypotension caused by dehydration. Continue meclizine Has received IV fluids with resolution of dehydration Still endorsing symptoms of dizziness.  Likely multifactorial in nature.  Given poor p.o. intake I suspect more functional decline   # Acute hypoxic respiratory failure due to pulmonary edema secondary to volume overload Mild diastolic dysfunction Echo showing EF 50 to 55% with grade 1 diastolic dysfunction CXR shows pulmonary edema and mild bilateral pleural effusion Currently on 2 L nasal cannula Status post Lasix 40 mg IV twice daily x 2 days Plan: No further diuresis Continue midodrine.  Dose  decreased to 5 mg 3 times daily If blood pressure stable discontinue on 12/28 Started Lasix 40 mg IV twice daily for 2 days Monitor hydration status BNP 941 elevated, D-dimer 0.59 slightly early, less likely PE Soft blood pressure, started midodrine 10 mg p.o. 3 times daily with holding parameters Continue to monitor input and output     # Elevated lactate level, Likely secondary to severe dehydration,  doubt sepsis although patient does have a UTI. Does not meet SIRS criteria.     # UTI Has completed 5 days course of antibiotics   # Metabolic acidosis, unknown cause.  Suspect related to poor nutritional status    #Hypokalemia, repleted as IV K-Phos #Hypophosphatemia, Phos repleted. Monitor electrolytes and replete as needed.   # Isotonic hyponatremia,-resolved      # Hypothyroid, Continue Synthroid   # Depression As per patient's son her husband passed away 2 years ago since then she has been depressed. Patient has stopped her home medications. Continue Cymbalta 30 mg p.o. daily, Remeron 30 mg p.o. nightly     # Bilateral sensorineural deafness, patient uses hearing aids. Continue supportive care     # Physical debility Functional decline Patient presented with fall however symptoms of weakness and poor p.o. intake seems to be progressive and worsening since passing of patient's husband a few years ago.  Patient likely experiencing functional decline and possibly contribution from complicated grief.  Patient has told caregiver that she is "ready to go" and would prefer to go home.  At this point I feel the patient is appropriate for further de-escalation of care and engagement of hospice  services.  I have discussed my recommendations with the caregiver on 12/27 and she will speak with sons and notify the care team of their decision    DVT prophylaxis: SQ Lovenox Code Status: DNR Family Communication: Caregiver at bedside 12/27 Disposition Plan: Status is:  Inpatient Remains inpatient appropriate because: Unsafe discharge plan   Level of care: Med-Surg  Consultants:  None  Procedures:  None  Antimicrobials: None   Subjective: Seen and examined.  Resting in bed.  Sleeping.  Difficult to arouse.  Caregiver at bedside.  History provided by caregiver.  Objective: Vitals:   12/21/23 1607 12/21/23 2048 12/22/23 0449 12/22/23 0825  BP: (!) 147/66 133/68 127/66 (!) 124/59  Pulse: 84 85 80 77  Resp: 20 20 20 20   Temp: 98.5 F (36.9 C) 98.8 F (37.1 C) 98 F (36.7 C) 98.4 F (36.9 C)  TempSrc:      SpO2: 99% 95% 97% 96%  Weight:      Height:       No intake or output data in the 24 hours ending 12/22/23 1144 Filed Weights   12/15/23 1030 12/16/23 2124  Weight: 50.7 kg 45.9 kg    Examination:  General exam: NAD.  Appears frail and chronically ill Respiratory system: Clear to auscultation. Respiratory effort normal. Cardiovascular system: S1-S2, RRR, no murmurs, no pedal edema Gastrointestinal system:, Soft, NT/ND, hyperactive bowel sounds Central nervous system: Lethargic.  Unable to assess orientation Extremities: Symmetric decreased power bilaterally Skin: No rashes, lesions or ulcers Psychiatry: Unable to assess    Data Reviewed: I have personally reviewed following labs and imaging studies  CBC: Recent Labs  Lab 12/17/23 0411 12/18/23 0423 12/19/23 0629 12/20/23 0600 12/21/23 0501  WBC 13.7* 10.7* 10.6* 8.9 8.0  NEUTROABS  --   --  6.8 5.7 5.2  HGB 11.7* 11.9* 13.5 12.8 12.4  HCT 32.8* 33.5* 39.0 37.1 36.5  MCV 85.9 85.5 86.9 86.9 88.8  PLT 208 193 234 239 230   Basic Metabolic Panel: Recent Labs  Lab 12/17/23 0411 12/18/23 0423 12/19/23 0629 12/20/23 0600 12/21/23 0501  NA 130* 135 130* 136 132*  K 3.4* 2.7* 3.5 3.7 3.6  CL 104 99 93* 98 96*  CO2 19* 26 29 26 26   GLUCOSE 112* 94 106* 100* 111*  BUN 19 16 26* 33* 26*  CREATININE 0.60 0.68 0.76 0.69 0.68  CALCIUM 7.9* 8.0* 8.4* 8.5* 8.6*  MG  1.8 1.8  --   --   --   PHOS 2.0* 2.3*  --   --   --    GFR: Estimated Creatinine Clearance: 32.5 mL/min (by C-G formula based on SCr of 0.68 mg/dL). Liver Function Tests: No results for input(s): "AST", "ALT", "ALKPHOS", "BILITOT", "PROT", "ALBUMIN" in the last 168 hours. No results for input(s): "LIPASE", "AMYLASE" in the last 168 hours. No results for input(s): "AMMONIA" in the last 168 hours. Coagulation Profile: No results for input(s): "INR", "PROTIME" in the last 168 hours. Cardiac Enzymes: No results for input(s): "CKTOTAL", "CKMB", "CKMBINDEX", "TROPONINI" in the last 168 hours. BNP (last 3 results) No results for input(s): "PROBNP" in the last 8760 hours. HbA1C: No results for input(s): "HGBA1C" in the last 72 hours. CBG: No results for input(s): "GLUCAP" in the last 168 hours. Lipid Profile: No results for input(s): "CHOL", "HDL", "LDLCALC", "TRIG", "CHOLHDL", "LDLDIRECT" in the last 72 hours. Thyroid Function Tests: No results for input(s): "TSH", "T4TOTAL", "FREET4", "T3FREE", "THYROIDAB" in the last 72 hours. Anemia Panel: No results for input(s): "  VITAMINB12", "FOLATE", "FERRITIN", "TIBC", "IRON", "RETICCTPCT" in the last 72 hours. Sepsis Labs: Recent Labs  Lab 12/15/23 1302 12/17/23 0413  LATICACIDVEN 2.3* 0.8    Recent Results (from the past 240 hours)  Blood culture (routine x 2)     Status: None   Collection Time: 12/15/23 12:48 PM   Specimen: BLOOD  Result Value Ref Range Status   Specimen Description   Final    BLOOD LEFT ANTECUBITAL Performed at [facility], [address] [place], [place] [address]    Special Requests   Final    BOTTLES DRAWN AEROBIC AND ANAEROBIC Blood Culture adequate volume Performed at [facility], [address], [place], [place] [address]    Culture   Final    NO GROWTH 5 DAYS Performed at [facility], [address], [place], [place] [address]    Report Status 12/20/2023 FINAL  Final  Blood  culture (routine x 2)     Status: None   Collection Time: 12/15/23 12:48 PM   Specimen: BLOOD  Result Value Ref Range Status   Specimen Description   Final    BLOOD BLOOD RIGHT FOREARM Performed at [facility], [address] [place], [place] [address]    Special Requests   Final    BOTTLES DRAWN AEROBIC AND ANAEROBIC Blood Culture adequate volume Performed at [facility], [address] [place], [place] [address]    Culture   Final    NO GROWTH 5 DAYS Performed at [facility], [address], [place], [place] [address]    Report Status 12/20/2023 FINAL  Final  Urine Culture     Status: Abnormal   Collection Time: 12/15/23 12:59 PM   Specimen: Urine, Clean Catch  Result Value Ref Range Status   Specimen Description   Final    URINE, CLEAN CATCH Performed at [facility], [address] [place], [place] [address]    Special Requests   Final    NONE Performed at [facility], [address] [place], [place] [address]    Culture (A)  Final    20,000 COLONIES/mL STREPTOCOCCUS MITIS/ORALIS 7,000 COLONIES/mL ESCHERICHIA COLI CALL MICROBIOLOGY LAB IF SENSITIVITIES ARE REQUIRED. STREPTOCOCCUS MITIS/ORALIS    Report Status 12/17/2023 FINAL  Final   Organism ID, Bacteria ESCHERICHIA COLI (A)  Final      Susceptibility   Escherichia coli - MIC*    AMPICILLIN <=2 SENSITIVE Sensitive     CEFAZOLIN <=4 SENSITIVE Sensitive     CEFEPIME <=0.12 SENSITIVE Sensitive     CEFTRIAXONE <=0.25 SENSITIVE Sensitive     CIPROFLOXACIN <=0.25 SENSITIVE Sensitive     GENTAMICIN <=1 SENSITIVE Sensitive     IMIPENEM <=0.25 SENSITIVE Sensitive     NITROFURANTOIN <=16 SENSITIVE Sensitive     TRIMETH/SULFA <=20 SENSITIVE Sensitive     AMPICILLIN/SULBACTAM <=2 SENSITIVE Sensitive     PIP/TAZO <=4 SENSITIVE Sensitive ug/mL    * 7,000 COLONIES/mL ESCHERICHIA COLI         Radiology Studies: No results found.      Scheduled Meds:  aspirin  EC  81 mg Oral Daily   DULoxetine  30 mg Oral Daily   enoxaparin (LOVENOX) injection  40 mg Subcutaneous Q24H   feeding supplement  237 mL Oral BID BM   levothyroxine  25 mcg Oral QAC breakfast   midodrine  10 mg Oral TID WC   mirtazapine  30 mg Oral QHS   multivitamin with minerals  1 tablet Oral Daily   pantoprazole  40 mg Oral Daily   Continuous Infusions:   LOS: 6 days      Tresa Moore, MD [facility]   If 7PM-7AM, please contact night-coverage  12/22/2023, 11:44 AM

## 2023-12-23 DIAGNOSIS — W19XXXD Unspecified fall, subsequent encounter: Secondary | ICD-10-CM | POA: Diagnosis not present

## 2023-12-23 DIAGNOSIS — A419 Sepsis, unspecified organism: Secondary | ICD-10-CM | POA: Diagnosis not present

## 2023-12-23 NOTE — Progress Notes (Signed)
PROGRESS NOTE    Krystal Richardson  ZOX:[id number] DOB: 28-Jun-1929 DOA: 12/15/2023 PCP: Barbette Reichmann, MD    Brief Narrative:   From HPI "Krystal Richardson is a 89 y.o. female with medical history significant of melanoma of scalp status post radiation and chemotherapy, anxiety/depression, basal cell carcinoma, HLD, presented with generalized weakness and fall.   Patient ate some chicken pot pie for dinner and then woke up sometime after midnight started to have vomiting x 3-4 times of stomach content, but denied any diarrhea no abdominal pain this morning, patient woke up this morning feeling dizziness when standing up and rising up, meantime feeling bilateral lower extremity weakness and fell and sliding down to the floor denied any LOC, no head or neck injury.   ED Course: Afebrile, nontachycardic nonhypotensive blood pressure 170/55 O2 saturation 100% on room air.  CT head showed no acute findings but irregular thickening of the calvarium and overlying scalp of the posterior frontal bone likely changes related to melanoma and treatment.  Blood work showed sodium 127, lactic acid 3.42.3, WBC 6.7.  UA showed WBC 21-50 with leukocytes "  Discussed care plan with caregiver and son on 12/27.  Decision made to discharge home with hospice services once DME is delivered.  Hospice liaison and TOC engaged.  DME ordered.   Assessment & Plan:   Principal Problem:   Fall Active Problems:   UTI (urinary tract infection)   Dehydration # Near syncope most likely due to hypotension caused by dehydration. Continue meclizine Has received IV fluids with resolution of dehydration Still endorsing symptoms of dizziness.  Likely multifactorial in nature.   Given poor p.o. intake I suspect more functional decline Fall precautions   # Acute hypoxic respiratory failure due to pulmonary edema secondary to volume overload Mild diastolic dysfunction Echo showing EF 50 to 55% with grade 1 diastolic  dysfunction CXR shows pulmonary edema and mild bilateral pleural effusion Currently on 2 L nasal cannula Status post Lasix 40 mg IV twice daily x 2 days Plan: No further diuresis DC midodrine     # Elevated lactate level, Likely secondary to severe dehydration,  doubt sepsis although patient does have a UTI. Does not meet SIRS criteria.     # UTI Has completed 5 days course of antibiotics   # Metabolic acidosis, unknown cause.  Suspect related to poor nutritional status    #Hypokalemia, repleted as IV K-Phos #Hypophosphatemia, Phos repleted. Monitor electrolytes and replete as needed.   # Isotonic hyponatremia,-resolved      # Hypothyroid, Continue Synthroid   # Depression As per patient's son her husband passed away 2 years ago since then she has been depressed. Patient has stopped her home medications. Continue Cymbalta 30 mg p.o. daily, Remeron 30 mg p.o. nightly     # Bilateral sensorineural deafness, patient uses hearing aids. Continue supportive care     # Physical debility Functional decline Patient presented with fall however symptoms of weakness and poor p.o. intake seems to be progressive and worsening since passing of patient's husband a few years ago.  Patient likely experiencing functional decline and possibly contribution from complicated grief.  Patient has told caregiver that she is "ready to go" and would prefer to go home.  Discussed patient status and recommendations with patient's son via phone on 12/27.  Shared decision making.  Electing to return home hospice services.  Plan for discharge home with hospice Monday 12/30    DVT prophylaxis: SQ Lovenox Code Status: DNR  Family Communication: Caregiver at bedside 12/27, son via phone 12/27 Disposition Plan: Status is: Inpatient Remains inpatient appropriate because: Unsafe discharge plan   Level of care: Med-Surg  Consultants:  None  Procedures:   None  Antimicrobials: None   Subjective: Seen and examined.  Resting in bed.  Sleeping.  Difficult to arouse.  No family or caregiver at bedside. Objective: Vitals:   12/22/23 2025 12/22/23 2030 12/23/23 0515 12/23/23 0742  BP: (!) 153/55 (!) 153/55 137/71 124/65  Pulse: 87 78 84 90  Resp: 20 20 18 20   Temp: 98.6 F (37 C) 98.6 F (37 C) 98.1 F (36.7 C) 98.1 F (36.7 C)  TempSrc:    Oral  SpO2: 96% 98% 95% 95%  Weight:      Height:       No intake or output data in the 24 hours ending 12/23/23 1125 Filed Weights   12/15/23 1030 12/16/23 2124  Weight: 50.7 kg 45.9 kg    Examination:  General exam: NAD.  Frail-appearing Respiratory system: Bibasilar crackles.  Normal work of breathing.  2 L Cardiovascular system: S1-S2, RRR, no murmurs, no pedal edema Gastrointestinal system:, Soft, NT/ND, hyperactive bowel sounds Central nervous system: Lethargic.  Unable to assess orientation Extremities: Symmetric decreased power bilaterally Skin: No rashes, lesions or ulcers Psychiatry: Unable to assess    Data Reviewed: I have personally reviewed following labs and imaging studies  CBC: Recent Labs  Lab 12/17/23 0411 12/18/23 0423 12/19/23 0629 12/20/23 0600 12/21/23 0501  WBC 13.7* 10.7* 10.6* 8.9 8.0  NEUTROABS  --   --  6.8 5.7 5.2  HGB 11.7* 11.9* 13.5 12.8 12.4  HCT 32.8* 33.5* 39.0 37.1 36.5  MCV 85.9 85.5 86.9 86.9 88.8  PLT 208 193 234 239 230   Basic Metabolic Panel: Recent Labs  Lab 12/17/23 0411 12/18/23 0423 12/19/23 0629 12/20/23 0600 12/21/23 0501  NA 130* 135 130* 136 132*  K 3.4* 2.7* 3.5 3.7 3.6  CL 104 99 93* 98 96*  CO2 19* 26 29 26 26   GLUCOSE 112* 94 106* 100* 111*  BUN 19 16 26* 33* 26*  CREATININE 0.60 0.68 0.76 0.69 0.68  CALCIUM 7.9* 8.0* 8.4* 8.5* 8.6*  MG 1.8 1.8  --   --   --   PHOS 2.0* 2.3*  --   --   --    GFR: Estimated Creatinine Clearance: 32.5 mL/min (by C-G formula based on SCr of 0.68 mg/dL). Liver Function  Tests: No results for input(s): "AST", "ALT", "ALKPHOS", "BILITOT", "PROT", "ALBUMIN" in the last 168 hours. No results for input(s): "LIPASE", "AMYLASE" in the last 168 hours. No results for input(s): "AMMONIA" in the last 168 hours. Coagulation Profile: No results for input(s): "INR", "PROTIME" in the last 168 hours. Cardiac Enzymes: No results for input(s): "CKTOTAL", "CKMB", "CKMBINDEX", "TROPONINI" in the last 168 hours. BNP (last 3 results) No results for input(s): "PROBNP" in the last 8760 hours. HbA1C: No results for input(s): "HGBA1C" in the last 72 hours. CBG: No results for input(s): "GLUCAP" in the last 168 hours. Lipid Profile: No results for input(s): "CHOL", "HDL", "LDLCALC", "TRIG", "CHOLHDL", "LDLDIRECT" in the last 72 hours. Thyroid Function Tests: No results for input(s): "TSH", "T4TOTAL", "FREET4", "T3FREE", "THYROIDAB" in the last 72 hours. Anemia Panel: No results for input(s): "VITAMINB12", "FOLATE", "FERRITIN", "TIBC", "IRON", "RETICCTPCT" in the last 72 hours. Sepsis Labs: Recent Labs  Lab 12/17/23 0413  LATICACIDVEN 0.8    Recent Results (from the past 240 hours)  Blood culture (  routine x 2)     Status: None   Collection Time: 12/15/23 12:48 PM   Specimen: BLOOD  Result Value Ref Range Status   Specimen Description   Final    BLOOD LEFT ANTECUBITAL Performed at [facility], [address], [place], [place] [address]    Special Requests   Final    BOTTLES DRAWN AEROBIC AND ANAEROBIC Blood Culture adequate volume Performed at [facility], [address] [place], [place] [address]    Culture   Final    NO GROWTH 5 DAYS Performed at [facility], [address], [place], [place] [address]    Report Status 12/20/2023 FINAL  Final  Blood culture (routine x 2)     Status: None   Collection Time: 12/15/23 12:48 PM   Specimen: BLOOD  Result Value Ref Range Status   Specimen Description   Final    BLOOD BLOOD RIGHT  FOREARM Performed at [facility], [address], [place], [place] [address]    Special Requests   Final    BOTTLES DRAWN AEROBIC AND ANAEROBIC Blood Culture adequate volume Performed at [facility], [address] [place], [place] [address]    Culture   Final    NO GROWTH 5 DAYS Performed at [facility], [address] [place], [place] [address]    Report Status 12/20/2023 FINAL  Final  Urine Culture     Status: Abnormal   Collection Time: 12/15/23 12:59 PM   Specimen: Urine, Clean Catch  Result Value Ref Range Status   Specimen Description   Final    URINE, CLEAN CATCH Performed at [facility], [address] [place], [place] [address]    Special Requests   Final    NONE Performed at [facility], [address] [place], [place] [address]    Culture (A)  Final    20,000 COLONIES/mL STREPTOCOCCUS MITIS/ORALIS 7,000 COLONIES/mL ESCHERICHIA COLI CALL MICROBIOLOGY LAB IF SENSITIVITIES ARE REQUIRED. STREPTOCOCCUS MITIS/ORALIS    Report Status 12/17/2023 FINAL  Final   Organism ID, Bacteria ESCHERICHIA COLI (A)  Final      Susceptibility   Escherichia coli - MIC*    AMPICILLIN <=2 SENSITIVE Sensitive     CEFAZOLIN <=4 SENSITIVE Sensitive     CEFEPIME <=0.12 SENSITIVE Sensitive     CEFTRIAXONE <=0.25 SENSITIVE Sensitive     CIPROFLOXACIN <=0.25 SENSITIVE Sensitive     GENTAMICIN <=1 SENSITIVE Sensitive     IMIPENEM <=0.25 SENSITIVE Sensitive     NITROFURANTOIN <=16 SENSITIVE Sensitive     TRIMETH/SULFA <=20 SENSITIVE Sensitive     AMPICILLIN/SULBACTAM <=2 SENSITIVE Sensitive     PIP/TAZO <=4 SENSITIVE Sensitive ug/mL    * 7,000 COLONIES/mL ESCHERICHIA COLI         Radiology Studies: No results found.      Scheduled Meds:  aspirin EC  81 mg Oral Daily   DULoxetine  30 mg Oral Daily   enoxaparin (LOVENOX) injection  40 mg Subcutaneous Q24H   feeding supplement  237 mL Oral BID BM   levothyroxine  25 mcg Oral  QAC breakfast   midodrine  5 mg Oral TID WC   mirtazapine  30 mg Oral QHS   multivitamin with minerals  1 tablet Oral Daily   pantoprazole  40 mg Oral Daily   Continuous Infusions:   LOS: 7 days      Tresa Moore, MD [facility]   If 7PM-7AM, please  contact night-coverage  12/23/2023, 11:25 AM

## 2023-12-23 NOTE — Progress Notes (Signed)
[facility] Liaison Note  Follow up on new referral planning discharge home on Monday after DME delivered.  No new needs at this time.  Hospital Liaison Team will follow peripherally through final disposition.  Please do not hesitate to call with any hospice related questions or concerns.  Norris Cross, RN Nurse Liaison [phone]

## 2023-12-23 NOTE — Plan of Care (Signed)

## 2023-12-24 DIAGNOSIS — W19XXXD Unspecified fall, subsequent encounter: Secondary | ICD-10-CM | POA: Diagnosis not present

## 2023-12-24 DIAGNOSIS — A419 Sepsis, unspecified organism: Secondary | ICD-10-CM | POA: Diagnosis not present

## 2023-12-24 NOTE — Progress Notes (Signed)
PROGRESS NOTE    Krystal Richardson  GMW:[id number] DOB: April 25, 1929 DOA: 12/15/2023 PCP: Barbette Reichmann, MD    Brief Narrative:   From HPI "Krystal Richardson is a 89 y.o. female with medical history significant of melanoma of scalp status post radiation and chemotherapy, anxiety/depression, basal cell carcinoma, HLD, presented with generalized weakness and fall.   Patient ate some chicken pot pie for dinner and then woke up sometime after midnight started to have vomiting x 3-4 times of stomach content, but denied any diarrhea no abdominal pain this morning, patient woke up this morning feeling dizziness when standing up and rising up, meantime feeling bilateral lower extremity weakness and fell and sliding down to the floor denied any LOC, no head or neck injury.   ED Course: Afebrile, nontachycardic nonhypotensive blood pressure 170/55 O2 saturation 100% on room air.  CT head showed no acute findings but irregular thickening of the calvarium and overlying scalp of the posterior frontal bone likely changes related to melanoma and treatment.  Blood work showed sodium 127, lactic acid 3.42.3, WBC 6.7.  UA showed WBC 21-50 with leukocytes "  Discussed care plan with caregiver and son on 12/27.  Decision made to discharge home with hospice services once DME is delivered.  Hospice liaison and TOC engaged.  DME ordered.   Assessment & Plan:   Principal Problem:   Fall Active Problems:   UTI (urinary tract infection)   Dehydration  # Near syncope most likely due to hypotension caused by dehydration. Continue meclizine as needed Has received IV fluids with resolution of dehydration Still endorsing symptoms of dizziness.  Likely multifactorial in nature.   Given poor p.o. intake I suspect more functional decline Fall precautions   # Acute hypoxic respiratory failure due to pulmonary edema secondary to volume overload Mild diastolic dysfunction Echo showing EF 50 to 55% with grade 1 diastolic  dysfunction CXR shows pulmonary edema and mild bilateral pleural effusion Currently on 2 L nasal cannula Status post Lasix 40 mg IV twice daily x 2 days Plan: No further diuresis DC midodrine     # Elevated lactate level, Likely secondary to severe dehydration,  doubt sepsis although patient does have a UTI. Does not meet SIRS criteria.     # UTI Has completed 5 days course of antibiotics   # Metabolic acidosis, unknown cause.  Suspect related to poor nutritional status    #Hypokalemia, repleted as IV K-Phos #Hypophosphatemia, Phos repleted. Monitor electrolytes and replete as needed.   # Isotonic hyponatremia,-resolved      # Hypothyroid, Continue Synthroid   # Depression As per patient's son her husband passed away 2 years ago since then she has been depressed. Patient has stopped her home medications. Continue Cymbalta 30 mg p.o. daily, Remeron 30 mg p.o. nightly     # Bilateral sensorineural deafness, patient uses hearing aids. Continue supportive care     # Physical debility Functional decline Patient presented with fall however symptoms of weakness and poor p.o. intake seems to be progressive and worsening since passing of patient's husband a few years ago.  Patient likely experiencing functional decline and possibly contribution from complicated grief.  Patient has told caregiver that she is "ready to go" and would prefer to go home.  Discussed patient status and recommendations with patient's son via phone on 12/27.  Shared decision making.  Electing to return home hospice services.  Plan for discharge home with hospice Monday 12/30    DVT prophylaxis: SQ Lovenox  Code Status: DNR Family Communication: Caregiver at bedside 12/27, son via phone 12/27 Disposition Plan: Status is: Inpatient Remains inpatient appropriate because: Unsafe discharge plan   Level of care: Med-Surg  Consultants:  None  Procedures:   None  Antimicrobials: None   Subjective: Seen and examined.  Resting in bed.  Sleeping.  Difficult to arouse.  No family or caregiver at bedside.  Objective: Vitals:   12/23/23 1613 12/23/23 2029 12/24/23 0508 12/24/23 0731  BP: (!) 144/55 130/82 121/62 129/63  Pulse: 83 93 86 80  Resp: 18 20 17 18   Temp: 98.1 F (36.7 C) 97.7 F (36.5 C) 98.3 F (36.8 C) 98.3 F (36.8 C)  TempSrc: Oral   Oral  SpO2: 99% 96% 94% 96%  Weight:      Height:       No intake or output data in the 24 hours ending 12/24/23 1048 Filed Weights   12/15/23 1030 12/16/23 2124  Weight: 50.7 kg 45.9 kg    Examination:  General exam: NAD.  Appears frail Respiratory system: Bibasilar crackles.  Normal work of breathing.  2 L Cardiovascular system: S1-S2, RRR, no murmurs, no pedal edema Gastrointestinal system:, Soft, NT/ND, hyperactive bowel sounds Central nervous system: Lethargic.  Unable to assess orientation Extremities: Symmetric decreased power bilaterally Skin: No rashes, lesions or ulcers Psychiatry: Unable to assess    Data Reviewed: I have personally reviewed following labs and imaging studies  CBC: Recent Labs  Lab 12/18/23 0423 12/19/23 0629 12/20/23 0600 12/21/23 0501  WBC 10.7* 10.6* 8.9 8.0  NEUTROABS  --  6.8 5.7 5.2  HGB 11.9* 13.5 12.8 12.4  HCT 33.5* 39.0 37.1 36.5  MCV 85.5 86.9 86.9 88.8  PLT 193 234 239 230   Basic Metabolic Panel: Recent Labs  Lab 12/18/23 0423 12/19/23 0629 12/20/23 0600 12/21/23 0501  NA 135 130* 136 132*  K 2.7* 3.5 3.7 3.6  CL 99 93* 98 96*  CO2 26 29 26 26   GLUCOSE 94 106* 100* 111*  BUN 16 26* 33* 26*  CREATININE 0.68 0.76 0.69 0.68  CALCIUM 8.0* 8.4* 8.5* 8.6*  MG 1.8  --   --   --   PHOS 2.3*  --   --   --    GFR: Estimated Creatinine Clearance: 32.5 mL/min (by C-G formula based on SCr of 0.68 mg/dL). Liver Function Tests: No results for input(s): "AST", "ALT", "ALKPHOS", "BILITOT", "PROT", "ALBUMIN" in the last 168  hours. No results for input(s): "LIPASE", "AMYLASE" in the last 168 hours. No results for input(s): "AMMONIA" in the last 168 hours. Coagulation Profile: No results for input(s): "INR", "PROTIME" in the last 168 hours. Cardiac Enzymes: No results for input(s): "CKTOTAL", "CKMB", "CKMBINDEX", "TROPONINI" in the last 168 hours. BNP (last 3 results) No results for input(s): "PROBNP" in the last 8760 hours. HbA1C: No results for input(s): "HGBA1C" in the last 72 hours. CBG: No results for input(s): "GLUCAP" in the last 168 hours. Lipid Profile: No results for input(s): "CHOL", "HDL", "LDLCALC", "TRIG", "CHOLHDL", "LDLDIRECT" in the last 72 hours. Thyroid Function Tests: No results for input(s): "TSH", "T4TOTAL", "FREET4", "T3FREE", "THYROIDAB" in the last 72 hours. Anemia Panel: No results for input(s): "VITAMINB12", "FOLATE", "FERRITIN", "TIBC", "IRON", "RETICCTPCT" in the last 72 hours. Sepsis Labs: No results for input(s): "PROCALCITON", "LATICACIDVEN" in the last 168 hours.   Recent Results (from the past 240 hours)  Blood culture (routine x 2)     Status: None   Collection Time: 12/15/23 12:48 PM  Specimen: BLOOD  Result Value Ref Range Status   Specimen Description   Final    BLOOD LEFT ANTECUBITAL Performed at [facility], [address] [place], [place] [address]    Special Requests   Final    BOTTLES DRAWN AEROBIC AND ANAEROBIC Blood Culture adequate volume Performed at [facility], [address] [place], [place] [address]    Culture   Final    NO GROWTH 5 DAYS Performed at [facility], [address] [place], [place] [address]    Report Status 12/20/2023 FINAL  Final  Blood culture (routine x 2)     Status: None   Collection Time: 12/15/23 12:48 PM   Specimen: BLOOD  Result Value Ref Range Status   Specimen Description   Final    BLOOD BLOOD RIGHT FOREARM Performed at [facility], [address], [place], [place]  [address]    Special Requests   Final    BOTTLES DRAWN AEROBIC AND ANAEROBIC Blood Culture adequate volume Performed at [facility], [address], [place], [place] [address]    Culture   Final    NO GROWTH 5 DAYS Performed at [facility], [address], [place], [place] [address]    Report Status 12/20/2023 FINAL  Final  Urine Culture     Status: Abnormal   Collection Time: 12/15/23 12:59 PM   Specimen: Urine, Clean Catch  Result Value Ref Range Status   Specimen Description   Final    URINE, CLEAN CATCH Performed at [facility], [address], [place], [place] [address]    Special Requests   Final    NONE Performed at [facility], [address] [place], [place] [address]    Culture (A)  Final    20,000 COLONIES/mL STREPTOCOCCUS MITIS/ORALIS 7,000 COLONIES/mL ESCHERICHIA COLI CALL MICROBIOLOGY LAB IF SENSITIVITIES ARE REQUIRED. STREPTOCOCCUS MITIS/ORALIS    Report Status 12/17/2023 FINAL  Final   Organism ID, Bacteria ESCHERICHIA COLI (A)  Final      Susceptibility   Escherichia coli - MIC*    AMPICILLIN <=2 SENSITIVE Sensitive     CEFAZOLIN <=4 SENSITIVE Sensitive     CEFEPIME <=0.12 SENSITIVE Sensitive     CEFTRIAXONE <=0.25 SENSITIVE Sensitive     CIPROFLOXACIN <=0.25 SENSITIVE Sensitive     GENTAMICIN <=1 SENSITIVE Sensitive     IMIPENEM <=0.25 SENSITIVE Sensitive     NITROFURANTOIN <=16 SENSITIVE Sensitive     TRIMETH/SULFA <=20 SENSITIVE Sensitive     AMPICILLIN/SULBACTAM <=2 SENSITIVE Sensitive     PIP/TAZO <=4 SENSITIVE Sensitive ug/mL    * 7,000 COLONIES/mL ESCHERICHIA COLI         Radiology Studies: No results found.      Scheduled Meds:  aspirin EC  81 mg Oral Daily   DULoxetine  30 mg Oral Daily   enoxaparin (LOVENOX) injection  40 mg Subcutaneous Q24H   feeding supplement  237 mL Oral BID BM   levothyroxine  25 mcg Oral QAC breakfast   mirtazapine  30 mg Oral QHS   multivitamin with minerals  1  tablet Oral Daily   pantoprazole  40 mg Oral Daily   Continuous Infusions:   LOS: 8 days      Tresa Moore, MD [facility]   If 7PM-7AM, please contact night-coverage  12/24/2023, 10:48 AM

## 2023-12-24 NOTE — Plan of Care (Signed)
  Problem: Education: Goal: Knowledge of General Education information will improve Description: Including pain rating scale, medication(s)/side effects and non-pharmacologic comfort measures Outcome: Progressing   Problem: Clinical Measurements: Goal: Ability to maintain clinical measurements within normal limits will improve Outcome: Progressing   Problem: Activity: Goal: Risk for activity intolerance will decrease Outcome: Progressing   Problem: Pain Management: Goal: General experience of comfort will improve Outcome: Progressing

## 2023-12-24 NOTE — TOC Progression Note (Signed)
Transition of Care [facility]) - Progression Note    Patient Details  Name: Krystal Richardson MRN: [id number] Date of Birth: [birth year]  Transition [facility]) CM/SW Contact  Rodney Langton, RN Phone Number: 12/24/2023, 9:39 AM  Clinical Narrative:    Spoke with son, confirms he has been in contact with company and DME will be delivered tomorrow morning between 10-12.  State he has secured care for most of the day, but still working on coverage for the night time to ensure she has 24/7 care.  TOC will continue to follow.    Expected Discharge Plan: Skilled Nursing Facility Barriers to Discharge: Continued Medical Work up  Expected Discharge Plan and Services     Post Acute Care Choice: Skilled Nursing Facility Living arrangements for the past 2 months: Single Family Home                                       Social Determinants of Health (SDOH) Interventions SDOH Screenings   Food Insecurity: No Food Insecurity (12/16/2023)  Housing: Unknown (12/16/2023)  Transportation Needs: No Transportation Needs (12/16/2023)  Utilities: Not At Risk (12/16/2023)  Tobacco Use: Low Risk  (12/16/2023)    Readmission Risk Interventions     No data to display

## 2023-12-24 NOTE — Progress Notes (Signed)
[facility] Liaison Note   Follow up on new referral planning discharge home on Monday after DME delivered.  Per son, DME is still set for delivery tomorrow between 10a-12p.   No new needs at this time.  Hospital Liaison Team will follow peripherally through final disposition.   Please do not hesitate to call with any hospice related questions or concerns.   Norris Cross, RN Nurse Liaison [phone]

## 2023-12-25 DIAGNOSIS — A419 Sepsis, unspecified organism: Secondary | ICD-10-CM | POA: Diagnosis not present

## 2023-12-25 DIAGNOSIS — W19XXXD Unspecified fall, subsequent encounter: Secondary | ICD-10-CM | POA: Diagnosis not present

## 2023-12-25 NOTE — Progress Notes (Signed)
Occupational Therapy Treatment Patient Details Name: Krystal Richardson MRN: [id number] DOB: 14-Jan-1929 Today's Date: 12/25/2023   History of present illness MCKYNLEY NITZEL is a 89 y.o. female with medical history significant of melanoma of scalp status post radiation and chemotherapy, anxiety/depression, basal cell carcinoma, HLD, presented with generalized weakness and fall.     Patient ate some chicken pot pie for dinner and then woke up sometime after midnight started to have vomiting x 3-4 times of stomach content, but denied any diarrhea no abdominal pain this morning, patient woke up this morning feeling dizziness when standing up and rising up, meantime feeling bilateral lower extremity weakness and fell and sliding down to the floor denied any LOC, no head or neck injury.   OT comments  Pt is supine in bed on arrival. Pleasant and agreeable to OT session. She denies pain. Pt performed bed mobility with CGA, bed elevated and use of rails. Pt required CGA for STS and MIN A for SPT to [facility] and then to recliner. CGA for clothing management and peri-care. Vitals WFL. Pt agreeable to sit in recliner for a while. Edu on seated there-ex to maximize her strength/endurance.  Pt left seated in recliner with all needs in place and will cont to require skilled acute OT services to maximize her safety and IND to return to PLOF.       If plan is discharge home, recommend the following:  Assistance with cooking/housework;Direct supervision/assist for medications management;Direct supervision/assist for financial management;Assist for transportation;Help with stairs or ramp for entrance;A little help with walking and/or transfers;A little help with bathing/dressing/bathroom   Equipment Recommendations       Recommendations for Other Services      Precautions / Restrictions Precautions Precautions: Fall Restrictions Weight Bearing Restrictions Per Provider Order: No       Mobility Bed Mobility Overal  bed mobility: Needs Assistance Bed Mobility: Supine to Sit, Sit to Supine     Supine to sit: Contact guard, HOB elevated, Used rails     General bed mobility comments: no physical assist provided, increased time/effort    Transfers Overall transfer level: Needs assistance Equipment used: 1 person hand held assist Transfers: Sit to/from Stand, Bed to chair/wheelchair/BSC Sit to Stand: Contact guard assist     Step pivot transfers: Min assist, Contact guard assist     General transfer comment: via HHA for STS and SPT to BSC from bed and from [facility] to recliner     Balance Overall balance assessment: Needs assistance, History of Falls Sitting-balance support: Feet supported, Bilateral upper extremity supported Sitting balance-Leahy Scale: Fair       Standing balance-Leahy Scale: Fair Standing balance comment: needs HHA to maintain standing balance during transfers                           ADL either performed or assessed with clinical judgement   ADL Overall ADL's : Needs assistance/impaired                         Toilet Transfer: Contact guard assist;Minimal assistance;BSC/3in1 Statistician Details (indicate cue type and reason): via HHA and SPT Toileting- Clothing Manipulation and Hygiene: Supervision/safety;Sitting/lateral lean              Extremity/Trunk Assessment              Vision       Perception     Praxis  Cognition                                                Exercises Other Exercises Other Exercises: Edu on seated ther-ex from recliner with pt perform BUE shoulder flexion x10 reps before covering herself back up with blanket.    Shoulder Instructions       General Comments HR and 02 WFL, minimal dizziness with transfer    Pertinent Vitals/ Pain       Pain Assessment Pain Assessment: No/denies pain  Home Living                                          Prior  Functioning/Environment              Frequency  Min 1X/week        Progress Toward Goals  OT Goals(current goals can now be found in the care plan section)  Progress towards OT goals: Progressing toward goals  Acute Rehab OT Goals Patient Stated Goal: improve strengh and mobility OT Goal Formulation: With patient Time For Goal Achievement: 12/30/23 Potential to Achieve Goals: Good  Plan      Co-evaluation                 AM-PAC OT "6 Clicks" Daily Activity     Outcome Measure   Help from another person eating meals?: None Help from another person taking care of personal grooming?: A Little Help from another person toileting, which includes using toliet, bedpan, or urinal?: A Little Help from another person bathing (including washing, rinsing, drying)?: A Lot Help from another person to put on and taking off regular upper body clothing?: A Little Help from another person to put on and taking off regular lower body clothing?: A Little 6 Click Score: 18    End of Session    OT Visit Diagnosis: Dizziness and giddiness (R42);Other abnormalities of gait and mobility (R26.89);Unsteadiness on feet (R26.81)   Activity Tolerance Patient tolerated treatment well   Patient Left in chair;with call bell/phone within reach   Nurse Communication          Time: 0981-1914 OT Time Calculation (min): 15 min  Charges: OT General Charges $OT Visit: 1 Visit OT Treatments $Self Care/Home Management : 8-22 mins  Clella Mckeel, OTR/L  12/25/23, 4:10 PM   Constance Goltz 12/25/2023, 4:08 PM

## 2023-12-25 NOTE — Progress Notes (Signed)
PROGRESS NOTE    Krystal Richardson  ZOX:[id number] DOB: [birth year] DOA: 12/15/2023 PCP: Barbette Reichmann, MD    Brief Narrative:   From HPI "Krystal Richardson is a 89 y.o. female with medical history significant of melanoma of scalp status post radiation and chemotherapy, anxiety/depression, basal cell carcinoma, HLD, presented with generalized weakness and fall.   Patient ate some chicken pot pie for dinner and then woke up sometime after midnight started to have vomiting x 3-4 times of stomach content, but denied any diarrhea no abdominal pain this morning, patient woke up this morning feeling dizziness when standing up and rising up, meantime feeling bilateral lower extremity weakness and fell and sliding down to the floor denied any LOC, no head or neck injury.   ED Course: Afebrile, nontachycardic nonhypotensive blood pressure 170/55 O2 saturation 100% on room air.  CT head showed no acute findings but irregular thickening of the calvarium and overlying scalp of the posterior frontal bone likely changes related to melanoma and treatment.  Blood work showed sodium 127, lactic acid 3.42.3, WBC 6.7.  UA showed WBC 21-50 with leukocytes "  Discussed care plan with caregiver and son on 12/27.  Decision made to discharge home with hospice services once DME is delivered.  Hospice liaison and TOC engaged.  DME ordered.   Assessment & Plan:   Principal Problem:   Fall Active Problems:   UTI (urinary tract infection)   Dehydration  # Near syncope most likely due to hypotension caused by dehydration. Continue meclizine as needed Has received IV fluids with resolution of dehydration Still endorsing symptoms of dizziness.  Likely multifactorial in nature.   Given poor p.o. intake I suspect more functional decline Fall precautions   # Acute hypoxic respiratory failure due to pulmonary edema secondary to volume overload Mild diastolic dysfunction Echo showing EF 50 to 55% with grade 1 diastolic  dysfunction CXR shows pulmonary edema and mild bilateral pleural effusion Currently on 2 L nasal cannula Status post Lasix 40 mg IV twice daily x 2 days Plan: No further diuresis DC midodrine     # Elevated lactate level, Likely secondary to severe dehydration,  doubt sepsis although patient does have a UTI. Does not meet SIRS criteria.     # UTI Has completed 5 days course of antibiotics   # Metabolic acidosis, unknown cause.  Suspect related to poor nutritional status    #Hypokalemia, repleted as IV K-Phos #Hypophosphatemia, Phos repleted. Monitor electrolytes and replete as needed.   # Isotonic hyponatremia,-resolved      # Hypothyroid, Continue Synthroid   # Depression As per patient's son her husband passed away 2 years ago since then she has been depressed. Patient has stopped her home medications. Continue Cymbalta 30 mg p.o. daily, Remeron 30 mg p.o. nightly     # Bilateral sensorineural deafness, patient uses hearing aids. Continue supportive care     # Physical debility Functional decline Patient presented with fall however symptoms of weakness and poor p.o. intake seems to be progressive and worsening since passing of patient's husband a few years ago.  Patient likely experiencing functional decline and possibly contribution from complicated grief.  Patient has told caregiver that she is "ready to go" and would prefer to go home.  Discussed patient status and recommendations with patient's son via phone on 12/27.  Shared decision making.  Electing to return home hospice services.  Awaiting delivery of DME.  Tentative plan discharge 12/31.   DVT prophylaxis: SQ Lovenox  Code Status: DNR Family Communication: Caregiver at bedside 12/27, son via phone 12/27 Disposition Plan: Status is: Inpatient Remains inpatient appropriate because: Unsafe discharge plan   Level of care: Med-Surg  Consultants:  None  Procedures:   None  Antimicrobials: None   Subjective: Seen and examined.  Awake.  Answers questions appropriately.  Objective: Vitals:   12/24/23 1654 12/24/23 2027 12/25/23 0413 12/25/23 0746  BP: (!) 104/48 130/70 104/77 123/60  Pulse: 85 96 95 92  Resp: 18 20 18 19   Temp: 98.7 F (37.1 C) 98 F (36.7 C) 98 F (36.7 C) 98.1 F (36.7 C)  TempSrc: Oral Oral  Oral  SpO2: 97% 98% 97% 92%  Weight:      Height:       No intake or output data in the 24 hours ending 12/25/23 0946 Filed Weights   12/15/23 1030 12/16/23 2124  Weight: 50.7 kg 45.9 kg    Examination:  General exam: No acute distress.  Appears frail Respiratory system: Bibasilar crackles.  Normal work of breathing.  2 L Cardiovascular system: S1-S2, RRR, no murmurs, no pedal edema Gastrointestinal system:, Soft, NT/ND, hyperactive bowel sounds Central nervous system: Lethargic.  Unable to assess orientation Extremities: Symmetric decreased power bilaterally Skin: No rashes, lesions or ulcers Psychiatry: Unable to assess    Data Reviewed: I have personally reviewed following labs and imaging studies  CBC: Recent Labs  Lab 12/19/23 0629 12/20/23 0600 12/21/23 0501  WBC 10.6* 8.9 8.0  NEUTROABS 6.8 5.7 5.2  HGB 13.5 12.8 12.4  HCT 39.0 37.1 36.5  MCV 86.9 86.9 88.8  PLT 234 239 230   Basic Metabolic Panel: Recent Labs  Lab 12/19/23 0629 12/20/23 0600 12/21/23 0501  NA 130* 136 132*  K 3.5 3.7 3.6  CL 93* 98 96*  CO2 29 26 26   GLUCOSE 106* 100* 111*  BUN 26* 33* 26*  CREATININE 0.76 0.69 0.68  CALCIUM 8.4* 8.5* 8.6*   GFR: Estimated Creatinine Clearance: 32.5 mL/min (by C-G formula based on SCr of 0.68 mg/dL). Liver Function Tests: No results for input(s): "AST", "ALT", "ALKPHOS", "BILITOT", "PROT", "ALBUMIN" in the last 168 hours. No results for input(s): "LIPASE", "AMYLASE" in the last 168 hours. No results for input(s): "AMMONIA" in the last 168 hours. Coagulation Profile: No results for  input(s): "INR", "PROTIME" in the last 168 hours. Cardiac Enzymes: No results for input(s): "CKTOTAL", "CKMB", "CKMBINDEX", "TROPONINI" in the last 168 hours. BNP (last 3 results) No results for input(s): "PROBNP" in the last 8760 hours. HbA1C: No results for input(s): "HGBA1C" in the last 72 hours. CBG: No results for input(s): "GLUCAP" in the last 168 hours. Lipid Profile: No results for input(s): "CHOL", "HDL", "LDLCALC", "TRIG", "CHOLHDL", "LDLDIRECT" in the last 72 hours. Thyroid Function Tests: No results for input(s): "TSH", "T4TOTAL", "FREET4", "T3FREE", "THYROIDAB" in the last 72 hours. Anemia Panel: No results for input(s): "VITAMINB12", "FOLATE", "FERRITIN", "TIBC", "IRON", "RETICCTPCT" in the last 72 hours. Sepsis Labs: No results for input(s): "PROCALCITON", "LATICACIDVEN" in the last 168 hours.   Recent Results (from the past 240 hours)  Blood culture (routine x 2)     Status: None   Collection Time: 12/15/23 12:48 PM   Specimen: BLOOD  Result Value Ref Range Status   Specimen Description   Final    BLOOD LEFT ANTECUBITAL Performed at [facility], [address], [place], [place] [address]    Special Requests   Final    BOTTLES DRAWN AEROBIC AND ANAEROBIC Blood Culture adequate  volume Performed at [facility], [address], [place], [place] [address]    Culture   Final    NO GROWTH 5 DAYS Performed at [facility], [address] [place], [place] [address]    Report Status 12/20/2023 FINAL  Final  Blood culture (routine x 2)     Status: None   Collection Time: 12/15/23 12:48 PM   Specimen: BLOOD  Result Value Ref Range Status   Specimen Description   Final    BLOOD BLOOD RIGHT FOREARM Performed at [facility], [address] [place], [place] [address]    Special Requests   Final    BOTTLES DRAWN AEROBIC AND ANAEROBIC Blood Culture adequate volume Performed at [facility], [address]  [place], [place] [address]    Culture   Final    NO GROWTH 5 DAYS Performed at [facility], [address], [place], [place] [address]    Report Status 12/20/2023 FINAL  Final  Urine Culture     Status: Abnormal   Collection Time: 12/15/23 12:59 PM   Specimen: Urine, Clean Catch  Result Value Ref Range Status   Specimen Description   Final    URINE, CLEAN CATCH Performed at [facility], [address] [place], [place] [address]    Special Requests   Final    NONE Performed at [facility], [address] [place], [place] [address]    Culture (A)  Final    20,000 COLONIES/mL STREPTOCOCCUS MITIS/ORALIS 7,000 COLONIES/mL ESCHERICHIA COLI CALL MICROBIOLOGY LAB IF SENSITIVITIES ARE REQUIRED. STREPTOCOCCUS MITIS/ORALIS    Report Status 12/17/2023 FINAL  Final   Organism ID, Bacteria ESCHERICHIA COLI (A)  Final      Susceptibility   Escherichia coli - MIC*    AMPICILLIN <=2 SENSITIVE Sensitive     CEFAZOLIN <=4 SENSITIVE Sensitive     CEFEPIME <=0.12 SENSITIVE Sensitive     CEFTRIAXONE <=0.25 SENSITIVE Sensitive     CIPROFLOXACIN <=0.25 SENSITIVE Sensitive     GENTAMICIN <=1 SENSITIVE Sensitive     IMIPENEM <=0.25 SENSITIVE Sensitive     NITROFURANTOIN <=16 SENSITIVE Sensitive     TRIMETH/SULFA <=20 SENSITIVE Sensitive     AMPICILLIN/SULBACTAM <=2 SENSITIVE Sensitive     PIP/TAZO <=4 SENSITIVE Sensitive ug/mL    * 7,000 COLONIES/mL ESCHERICHIA COLI         Radiology Studies: No results found.      Scheduled Meds:  aspirin EC  81 mg Oral Daily   DULoxetine  30 mg Oral Daily   enoxaparin (LOVENOX) injection  40 mg Subcutaneous Q24H   feeding supplement  237 mL Oral BID BM   levothyroxine  25 mcg Oral QAC breakfast   mirtazapine  30 mg Oral QHS   multivitamin with minerals  1 tablet Oral Daily   pantoprazole  40 mg Oral Daily   Continuous Infusions:   LOS: 9 days      Tresa Moore, MD [facility]   If 7PM-7AM, please  contact night-coverage  12/25/2023, 9:46 AM

## 2023-12-25 NOTE — Plan of Care (Signed)

## 2023-12-25 NOTE — Progress Notes (Addendum)
[facility] Liaison Note   Follow up on new referral planning discharge home, possibly tomorrow.  Patient's son working on Media planner an additional caregiver.    Per son, DME is still set for delivery today between 10a-12p.   No new needs at this time.  Hospital Liaison Team will follow peripherally through final disposition.   Please do not hesitate to call with any hospice related questions or concerns.  [facility] Liaison [phone]

## 2023-12-25 NOTE — Progress Notes (Signed)
PT Cancellation Note  Patient Details Name: Krystal Richardson MRN: [id number] DOB: 11-01-1929   Cancelled Treatment:    Reason Eval/Treat Not Completed: Other (comment). Pt seated in recliner upon PT arrival with family member present. Patient declined participation in PT services. Agreeable to therapy services tomorrow. Will re-attempt at later date/time.    Howie Ill, PT, DPT 12/25/23 1:09 PM

## 2023-12-25 NOTE — TOC Progression Note (Signed)
Transition of Care [facility]) - Progression Note    Patient Details  Name: Krystal Richardson MRN: [id number] Date of Birth: Jan 25, 1929  Transition of Care [facility]) CM/SW Contact  Allena Katz, LCSW Phone Number: 12/25/2023, 9:21 AM  Clinical Narrative:   CSW spoke with son who states that he is waiting for DME to be delivered and he is meeting someone tonight at 6 to coordinate home care and hopes for discharge home tomorrow. Team updated.     Expected Discharge Plan: Skilled Nursing Facility Barriers to Discharge: Continued Medical Work up  Expected Discharge Plan and Services     Post Acute Care Choice: Skilled Nursing Facility Living arrangements for the past 2 months: Single Family Home                                       Social Determinants of Health (SDOH) Interventions SDOH Screenings   Food Insecurity: No Food Insecurity (12/16/2023)  Housing: Unknown (12/16/2023)  Transportation Needs: No Transportation Needs (12/16/2023)  Utilities: Not At Risk (12/16/2023)  Tobacco Use: Low Risk  (12/16/2023)    Readmission Risk Interventions     No data to display

## 2023-12-25 NOTE — Plan of Care (Signed)
?  Problem: Education: ?Goal: Knowledge of General Education information will improve ?Description: Including pain rating scale, medication(s)/side effects and non-pharmacologic comfort measures ?Outcome: Progressing ?  ?Problem: Clinical Measurements: ?Goal: Ability to maintain clinical measurements within normal limits will improve ?Outcome: Progressing ?  ?Problem: Nutrition: ?Goal: Adequate nutrition will be maintained ?Outcome: Progressing ?  ?Problem: Elimination: ?Goal: Will not experience complications related to bowel motility ?Outcome: Progressing ?  ?

## 2023-12-26 DIAGNOSIS — A419 Sepsis, unspecified organism: Secondary | ICD-10-CM | POA: Diagnosis not present

## 2023-12-26 DIAGNOSIS — W19XXXD Unspecified fall, subsequent encounter: Secondary | ICD-10-CM | POA: Diagnosis not present

## 2023-12-26 MED ORDER — TRAMADOL HCL 50 MG PO TABS: 50.0000 mg | ORAL_TABLET | Freq: Four times a day (QID) | ORAL | 0 refills | Status: DC | PRN

## 2023-12-26 NOTE — Plan of Care (Signed)

## 2023-12-26 NOTE — Discharge Summary (Signed)
 Physician Discharge Summary  JOCILYNN GRADE FMW:[id number] DOB: [birth year] DOA: 12/15/2023  PCP: [name], MD  Admit date: 12/15/2023 Discharge date: 12/26/2023  Admitted From: Home Disposition:  Home with hospice  Recommendations for Outpatient Follow-up:  Per outpatient hospice provider   Home Health:NA  Equipment/Devices:Oxygen 2L via [place]   Discharge Condition:Stable  CODE STATUS:DNR  Diet recommendation: Comfort/regular  Brief/Interim Summary: From HPI NICOLAS SISLER is a 89 y.o. female with medical history significant of melanoma of scalp status post radiation and chemotherapy, anxiety/depression, basal cell carcinoma, HLD, presented with generalized weakness and fall.   Patient ate some chicken pot pie for dinner and then woke up sometime after midnight started to have vomiting x 3-4 times of stomach content, but denied any diarrhea no abdominal pain this morning, patient woke up this morning feeling dizziness when standing up and rising up, meantime feeling bilateral lower extremity weakness and fell and sliding down to the floor denied any LOC, no head or neck injury.  Discussed care plan with caregiver and son on 12/27. Decision made to discharge home with hospice services once DME is delivered. Hospice liaison and TOC engaged. DME ordered.     Discharge Diagnoses:  Principal Problem:   Fall Active Problems:   UTI (urinary tract infection)   Dehydration # Near syncope most likely due to hypotension caused by dehydration. Dc home with hospice   # Acute hypoxic respiratory failure due to pulmonary edema secondary to volume overload Mild diastolic dysfunction Echo showing EF 50 to 55% with grade 1 diastolic dysfunction CXR shows pulmonary edema and mild bilateral pleural effusion Currently on 2 L nasal cannula Status post Lasix  40 mg IV twice daily x 2 days Plan: No further diuresis DC midodrine  Oxygen 2L via [place]   # UTI Has completed 5 days course of  antibiotics   # Hypothyroid, Continue Synthroid    # Depression As per patient's son her husband passed away 2 years ago since then she has been depressed. Patient has stopped her home medications. Continue Cymbalta  30 mg p.o. daily, Remeron  30 mg p.o. nightly     # Bilateral sensorineural deafness, patient uses hearing aids. Continue supportive care     # Physical debility Functional decline Patient presented with fall however symptoms of weakness and poor p.o. intake seems to be progressive and worsening since passing of patient's husband a few years ago.  Patient likely experiencing functional decline and possibly contribution from complicated grief.  Patient has told caregiver that she is ready to go and would prefer to go home.  Discussed patient status and recommendations with patient's son via phone on 12/27.  Shared decision making.  Electing to return home hospice services.  Discharge home 12/31   Discharge Instructions  Discharge Instructions     Diet - low sodium heart healthy   Complete by: As directed    Increase activity slowly   Complete by: As directed    No wound care   Complete by: As directed       Allergies as of 12/26/2023       Reactions   Toprol Xl [metoprolol Tartrate]         Medication List     STOP taking these medications    aspirin  EC 81 MG tablet   levothyroxine  25 MCG tablet Commonly known as: SYNTHROID    mirtazapine  30 MG disintegrating tablet Commonly known as: REMERON  SOL-TAB   mupirocin  ointment 2 % Commonly known as: BACTROBAN    pantoprazole  40 MG  tablet Commonly known as: Protonix        TAKE these medications    DULoxetine  30 MG capsule Commonly known as: CYMBALTA  Take 1 capsule (30 mg total) by mouth daily.   feeding supplement Liqd Take 237 mLs by mouth 2 (two) times daily between meals.   traMADol  50 MG tablet Commonly known as: ULTRAM  Take 1 tablet (50 mg total) by mouth every 6 (six) hours as needed  for severe pain (pain score 7-10).               Durable Medical Equipment  (From admission, onward)           Start     Ordered   12/22/23 1512  For home use only DME lightweight manual wheelchair with seat cushion  Once       Comments: Patient suffers from weakness which impairs their ability to perform daily activities like bathing, dressing, feeding, grooming, and toileting in the home.  A cane, crutch, or walker will not resolve  issue with performing activities of daily living. A wheelchair will allow patient to safely perform daily activities. Patient is not able to propel themselves in the home using a standard weight wheelchair due to arm weakness, endurance, and general weakness. Patient can self propel in the lightweight wheelchair. Length of need Lifetime. Accessories: elevating leg rests (ELRs), wheel locks, extensions and anti-tippers.   12/22/23 1511   12/22/23 1511  For home use only DME Overbed table  Once        12/22/23 1511   12/22/23 1511  For home use only DME Shower stool  Once        12/22/23 1511   12/22/23 1511  For home use only DME oxygen  Once       Question Answer Comment  Length of Need Lifetime   Liters per Minute 2   Frequency Continuous (stationary and portable oxygen unit needed)   Oxygen conserving device Yes   Oxygen delivery system Gas      12/22/23 1511   12/22/23 1510  For home use only DME Hospital bed  Once       Question Answer Comment  Length of Need Lifetime   The above medical condition requires: Patient requires the ability to reposition frequently   Bed type Semi-electric   Support Surface: Low Air loss Mattress      12/22/23 1511            Follow-up Information     [name], MD Follow up.   Specialty: Internal Medicine Why: Hospital follow up Contact information: [address] [place] [address] [phone]                Allergies  Allergen Reactions   Toprol Xl [Metoprolol  Tartrate]     Consultations: Palliative care   Procedures/Studies: ECHOCARDIOGRAM COMPLETE Result Date: 12/19/2023    ECHOCARDIOGRAM REPORT   Patient Name:   REGANNE MESSERSCHMIDT Date of Exam: 12/18/2023 Medical Rec #:  [id number]     Height:       63.0 in Accession #:    [id number]    Weight:       101.2 lb Date of Birth:  1929-12-11      BSA:          1.448 m Patient Age:    89 years      BP:           133/60 mmHg Patient Gender: F  HR:           72 bpm. Exam Location:  [facility] Procedure: 2D Echo, Cardiac Doppler and Color Doppler Indications:     CHF  History:         Patient has no prior history of Echocardiogram examinations.                  CHF; Risk Factors:Dyslipidemia.  Sonographer:     Naomie Reef Referring Phys:  [id number] ELVAN SOR Diagnosing Phys: Marsa Dooms MD IMPRESSIONS  1. Left ventricular ejection fraction, by estimation, is 50 to 55%. The left ventricle has low normal function. The left ventricle has no regional wall motion abnormalities. Left ventricular diastolic parameters are consistent with Grade I diastolic dysfunction (impaired relaxation).  2. Right ventricular systolic function is normal. The right ventricular size is normal. There is normal pulmonary artery systolic pressure.  3. The mitral valve is normal in structure. Trivial mitral valve regurgitation. No evidence of mitral stenosis.  4. The aortic valve is normal in structure. Aortic valve regurgitation is not visualized. No aortic stenosis is present.  5. The inferior vena cava is normal in size with greater than 50% respiratory variability, suggesting right atrial pressure of 3 mmHg. FINDINGS  Left Ventricle: Left ventricular ejection fraction, by estimation, is 50 to 55%. The left ventricle has low normal function. The left ventricle has no regional wall motion abnormalities. The left ventricular internal cavity size was normal in size. There is no left ventricular hypertrophy. Left ventricular diastolic  parameters are consistent with Grade I diastolic dysfunction (impaired relaxation). Right Ventricle: The right ventricular size is normal. No increase in right ventricular wall thickness. Right ventricular systolic function is normal. There is normal pulmonary artery systolic pressure. The tricuspid regurgitant velocity is 2.48 m/s, and  with an assumed right atrial pressure of 3 mmHg, the estimated right ventricular systolic pressure is 27.6 mmHg. Left Atrium: Left atrial size was normal in size. Right Atrium: Right atrial size was normal in size. Pericardium: There is no evidence of pericardial effusion. Mitral Valve: The mitral valve is normal in structure. Trivial mitral valve regurgitation. No evidence of mitral valve stenosis. MV peak gradient, 8.5 mmHg. The mean mitral valve gradient is 3.0 mmHg. Tricuspid Valve: The tricuspid valve is normal in structure. Tricuspid valve regurgitation is trivial. No evidence of tricuspid stenosis. Aortic Valve: The aortic valve is normal in structure. Aortic valve regurgitation is not visualized. No aortic stenosis is present. Aortic valve mean gradient measures 5.0 mmHg. Aortic valve peak gradient measures 9.2 mmHg. Aortic valve area, by VTI measures 2.12 cm. Pulmonic Valve: The pulmonic valve was normal in structure. Pulmonic valve regurgitation is not visualized. No evidence of pulmonic stenosis. Aorta: The aortic root is normal in size and structure. Venous: The inferior vena cava is normal in size with greater than 50% respiratory variability, suggesting right atrial pressure of 3 mmHg. IAS/Shunts: No atrial level shunt detected by color flow Doppler.  LEFT VENTRICLE PLAX 2D LVIDd:         4.00 cm     Diastology LVIDs:         3.00 cm     LV e' medial:    7.07 cm/s LV PW:         0.90 cm     LV E/e' medial:  13.3 LV IVS:        1.00 cm     LV e' lateral:   8.16 cm/s LVOT diam:  1.90 cm     LV E/e' lateral: 11.5 LV SV:         73 LV SV Index:   51 LVOT Area:     2.84  cm  LV Volumes (MOD) LV vol d, MOD A2C: 45.7 ml LV vol d, MOD A4C: 46.4 ml LV vol s, MOD A2C: 15.7 ml LV vol s, MOD A4C: 17.2 ml LV SV MOD A2C:     30.0 ml LV SV MOD A4C:     46.4 ml LV SV MOD BP:      30.8 ml RIGHT VENTRICLE RV Basal diam:  3.35 cm RV Mid diam:    2.90 cm RV S prime:     20.00 cm/s TAPSE (M-mode): 2.1 cm LEFT ATRIUM             Index        RIGHT ATRIUM           Index LA diam:        3.00 cm 2.07 cm/m   RA Area:     13.20 cm LA Vol (A2C):   35.6 ml 24.59 ml/m  RA Volume:   35.40 ml  24.46 ml/m LA Vol (A4C):   30.7 ml 21.21 ml/m LA Biplane Vol: 36.4 ml 25.15 ml/m  AORTIC VALVE                     PULMONIC VALVE AV Area (Vmax):    2.03 cm      PV Vmax:       0.82 m/s AV Area (Vmean):   1.90 cm      PV Peak grad:  2.7 mmHg AV Area (VTI):     2.12 cm AV Vmax:           152.00 cm/s AV Vmean:          108.000 cm/s AV VTI:            0.347 m AV Peak Grad:      9.2 mmHg AV Mean Grad:      5.0 mmHg LVOT Vmax:         109.00 cm/s LVOT Vmean:        72.200 cm/s LVOT VTI:          0.259 m LVOT/AV VTI ratio: 0.75  AORTA Ao Root diam: 2.90 cm MITRAL VALVE                TRICUSPID VALVE MV Area (PHT): 2.70 cm     TR Peak grad:   24.6 mmHg MV Area VTI:   1.91 cm     TR Vmax:        248.00 cm/s MV Peak grad:  8.5 mmHg MV Mean grad:  3.0 mmHg     SHUNTS MV Vmax:       1.46 m/s     Systemic VTI:  0.26 m MV Vmean:      72.1 cm/s    Systemic Diam: 1.90 cm MV Decel Time: 281 msec MV E velocity: 93.80 cm/s MV A velocity: 128.00 cm/s MV E/A ratio:  0.73 Marsa Dooms MD Electronically signed by Marsa Dooms MD Signature Date/Time: 12/19/2023/11:16:14 AM    Final    DG Chest Port 1 View Result Date: 12/17/2023 CLINICAL DATA:  [id number] SOB (shortness of breath) [id number] EXAM: PORTABLE CHEST 1 VIEW COMPARISON:  12/15/2023 chest radiograph. FINDINGS: Stable cardiomediastinal silhouette with top-normal heart size. No pneumothorax. Small bilateral pleural effusions are new. Mild pulmonary edema  is new.  IMPRESSION: New mild congestive heart failure with small bilateral pleural effusions. Electronically Signed   By: Selinda DELENA Blue M.D.   On: 12/17/2023 09:21   CT CHEST ABDOMEN PELVIS W CONTRAST Result Date: 12/15/2023 CLINICAL DATA:  Abdominal pain. EXAM: CT CHEST, ABDOMEN, AND PELVIS WITH CONTRAST TECHNIQUE: Multidetector CT imaging of the chest, abdomen and pelvis was performed following the standard protocol during bolus administration of intravenous contrast. RADIATION DOSE REDUCTION: This exam was performed according to the departmental dose-optimization program which includes automated exposure control, adjustment of the mA and/or kV according to patient size and/or use of iterative reconstruction technique. CONTRAST:  OMNIPAQUE  IOHEXOL  300 MG/ML  SOLN COMPARISON:  None Available. FINDINGS: CT CHEST FINDINGS Cardiovascular: Coronary artery calcification and aortic atherosclerotic calcification. Mediastinum/Nodes: No axillary or supraclavicular adenopathy. No mediastinal or hilar adenopathy. No pericardial fluid. Esophagus normal. Lungs/Pleura: No suspicious pulmonary nodules. Normal pleural. Airways normal. Musculoskeletal: No aggressive osseous lesion. CT ABDOMEN AND PELVIS FINDINGS Hepatobiliary: No focal hepatic lesion. No biliary ductal dilatation. Gallbladder is normal. Common bile duct is normal. Pancreas: Normal pancreas Spleen: Normal spleen Adrenals/urinary tract: Adrenal glands and kidneys are normal. Simple fluid attenuation cyst of the LEFT kidney measures 4 cm on postcontrast imaging. The ureters and bladder normal. Stomach/Bowel: Stomach, small-bowel and cecum are normal. The appendix is not identified but there is no pericecal inflammation to suggest appendicitis. The colon and rectosigmoid colon are normal. Vascular/Lymphatic: Abdominal aorta is normal caliber with atherosclerotic calcification. There is no retroperitoneal or periportal lymphadenopathy. No pelvic lymphadenopathy.  Reproductive: Round solid lesion in the LEFT operator space with internal speckled calcifications measures 3.7 x 3.4 cm (image 91/series 3. This lesion is not changed in size or imaging characteristics from CT 7 09/14/2021. Other: No free fluid. Musculoskeletal: No aggressive osseous lesion. IMPRESSION: 1. No acute findings the chest abdomen pelvis. 2. No bowel obstruction or inflammation. 3. Appendix not identified but no secondary signs appendicitis. 4. Stable solid rounded lesion in the LEFT pelvic sidewall from 07/22/2021. Stability over time favors benign etiology. Electronically Signed   By: Jackquline Boxer M.D.   On: 12/15/2023 14:40   DG Chest Portable 1 View Result Date: 12/15/2023 CLINICAL DATA:  Weakness EXAM: PORTABLE CHEST 1 VIEW COMPARISON:  None Available. FINDINGS: Normal mediastinum and cardiac silhouette. Normal pulmonary vasculature. No evidence of effusion, infiltrate, or pneumothorax. No acute bony abnormality. IMPRESSION: No acute cardiopulmonary process. Electronically Signed   By: Jackquline Boxer M.D.   On: 12/15/2023 14:25   CT HEAD WO CONTRAST ( ) Result Date: 12/15/2023 CLINICAL DATA:  Falls, vertigo, nausea and vomiting EXAM: CT HEAD WITHOUT CONTRAST CT CERVICAL SPINE WITHOUT CONTRAST TECHNIQUE: Multidetector CT imaging of the head and cervical spine was performed following the standard protocol without intravenous contrast. Multiplanar CT image reconstructions of the cervical spine were also generated. RADIATION DOSE REDUCTION: This exam was performed according to the departmental dose-optimization program which includes automated exposure control, adjustment of the mA and/or kV according to patient size and/or use of iterative reconstruction technique. COMPARISON:  09/09/2013 CT head and 09/10/2013 MRI head, no prior CT cervical spine trauma correlation is made with 01/27/2005 MRI cervical spine FINDINGS: CT HEAD FINDINGS Brain: No evidence of acute infarct, hemorrhage,  mass, mass effect, or midline shift. No hydrocephalus or extra-axial fluid collection. Age related cerebral atrophy. Periventricular white matter changes, likely the sequela of chronic small vessel ischemic disease. Remote lacunar infarcts in the basal ganglia. Vascular: No hyperdense vessel. Atherosclerotic calcifications in the intracranial  carotid and vertebral arteries. Skull: Negative for fracture. Irregular thinning of the calvarium and overlying scalp of the posterior frontal bone (series 5, image 31 of the and series 4, image 19), of indeterminate etiology. Sinuses/Orbits: No acute finding. CT CERVICAL SPINE FINDINGS Alignment: No traumatic listhesis. Skull base and vertebrae: No acute fracture or suspicious osseous lesion. Soft tissues and spinal canal: No prevertebral fluid or swelling. No visible canal hematoma. Heterogeneous thyroid , with the largest discrete lesion measuring up to 1.1 cm, for which no follow-up is currently indicated. (Reference: J Am Coll Radiol. 2015 Feb;12(2): 143-50) Disc levels: Degenerative changes in the cervical spine.No high-grade spinal canal stenosis. Upper chest: No focal pulmonary opacity or pleural effusion. IMPRESSION: 1. No acute intracranial process. 2. No acute fracture or traumatic listhesis in the cervical spine. 3. Irregular thinning of the calvarium and overlying scalp of the posterior frontal bone, of indeterminate etiology but possibly related to the patient's history of melanoma. Correlate with physical exam and/or surgical history. Electronically Signed   By: Donald Campion M.D.   On: 12/15/2023 13:19   CT CERVICAL SPINE WO CONTRAST Result Date: 12/15/2023 CLINICAL DATA:  Falls, vertigo, nausea and vomiting EXAM: CT HEAD WITHOUT CONTRAST CT CERVICAL SPINE WITHOUT CONTRAST TECHNIQUE: Multidetector CT imaging of the head and cervical spine was performed following the standard protocol without intravenous contrast. Multiplanar CT image reconstructions of the  cervical spine were also generated. RADIATION DOSE REDUCTION: This exam was performed according to the departmental dose-optimization program which includes automated exposure control, adjustment of the mA and/or kV according to patient size and/or use of iterative reconstruction technique. COMPARISON:  09/09/2013 CT head and 09/10/2013 MRI head, no prior CT cervical spine trauma correlation is made with 01/27/2005 MRI cervical spine FINDINGS: CT HEAD FINDINGS Brain: No evidence of acute infarct, hemorrhage, mass, mass effect, or midline shift. No hydrocephalus or extra-axial fluid collection. Age related cerebral atrophy. Periventricular white matter changes, likely the sequela of chronic small vessel ischemic disease. Remote lacunar infarcts in the basal ganglia. Vascular: No hyperdense vessel. Atherosclerotic calcifications in the intracranial carotid and vertebral arteries. Skull: Negative for fracture. Irregular thinning of the calvarium and overlying scalp of the posterior frontal bone (series 5, image 31 of the and series 4, image 19), of indeterminate etiology. Sinuses/Orbits: No acute finding. CT CERVICAL SPINE FINDINGS Alignment: No traumatic listhesis. Skull base and vertebrae: No acute fracture or suspicious osseous lesion. Soft tissues and spinal canal: No prevertebral fluid or swelling. No visible canal hematoma. Heterogeneous thyroid , with the largest discrete lesion measuring up to 1.1 cm, for which no follow-up is currently indicated. (Reference: J Am Coll Radiol. 2015 Feb;12(2): 143-50) Disc levels: Degenerative changes in the cervical spine.No high-grade spinal canal stenosis. Upper chest: No focal pulmonary opacity or pleural effusion. IMPRESSION: 1. No acute intracranial process. 2. No acute fracture or traumatic listhesis in the cervical spine. 3. Irregular thinning of the calvarium and overlying scalp of the posterior frontal bone, of indeterminate etiology but possibly related to the  patient's history of melanoma. Correlate with physical exam and/or surgical history. Electronically Signed   By: Donald Campion M.D.   On: 12/15/2023 13:19      Subjective: Seen and examined on day of dc.  Stable, appropriate for dispo to hospice  Discharge Exam: Vitals:   12/26/23 0351 12/26/23 0741  BP: (!) 125/49 129/62  Pulse: 83 84  Resp: 17 16  Temp: 98.3 F (36.8 C) 98.2 F (36.8 C)  SpO2: 97% 98%  Vitals:   12/25/23 1644 12/25/23 1944 12/26/23 0351 12/26/23 0741  BP: 108/60 138/66 (!) 125/49 129/62  Pulse: 96 82 83 84  Resp: 19 17 17 16   Temp: 98.1 F (36.7 C) 97.6 F (36.4 C) 98.3 F (36.8 C) 98.2 F (36.8 C)  TempSrc: Oral Oral Oral Oral  SpO2: 97% 99% 97% 98%  Weight:      Height:        General: Pt is alert, awake, not in acute distress Cardiovascular: RRR, S1/S2 +, no rubs, no gallops Respiratory: CTA bilaterally, no wheezing, no rhonchi Abdominal: Soft, NT, ND, bowel sounds + Extremities: no edema, no cyanosis    The results of significant diagnostics from this hospitalization (including imaging, microbiology, ancillary and laboratory) are listed below for reference.     Microbiology: No results found for this or any previous visit (from the past 240 hours).   Labs: BNP (last 3 results) Recent Labs    12/17/23 0900  BNP 941.2*   Basic Metabolic Panel: Recent Labs  Lab 12/20/23 0600 12/21/23 0501  NA 136 132*  K 3.7 3.6  CL 98 96*  CO2 26 26  GLUCOSE 100* 111*  BUN 33* 26*  CREATININE 0.69 0.68  CALCIUM  8.5* 8.6*   Liver Function Tests: No results for input(s): AST, ALT, ALKPHOS, BILITOT, PROT, ALBUMIN in the last 168 hours. No results for input(s): LIPASE, AMYLASE in the last 168 hours. No results for input(s): AMMONIA in the last 168 hours. CBC: Recent Labs  Lab 12/20/23 0600 12/21/23 0501  WBC 8.9 8.0  NEUTROABS 5.7 5.2  HGB 12.8 12.4  HCT 37.1 36.5  MCV 86.9 88.8  PLT 239 230   Cardiac  Enzymes: No results for input(s): CKTOTAL, CKMB, CKMBINDEX, TROPONINI in the last 168 hours. BNP: Invalid input(s): POCBNP CBG: No results for input(s): GLUCAP in the last 168 hours. D-Dimer No results for input(s): DDIMER in the last 72 hours. Hgb A1c No results for input(s): HGBA1C in the last 72 hours. Lipid Profile No results for input(s): CHOL, HDL, LDLCALC, TRIG, CHOLHDL, LDLDIRECT in the last 72 hours. Thyroid  function studies No results for input(s): TSH, T4TOTAL, T3FREE, THYROIDAB in the last 72 hours.  Invalid input(s): FREET3 Anemia work up No results for input(s): VITAMINB12, FOLATE, FERRITIN, TIBC, IRON, RETICCTPCT in the last 72 hours. Urinalysis    Component Value Date/Time   COLORURINE STRAW (A) 12/15/2023 1116   APPEARANCEUR HAZY (A) 12/15/2023 1116   APPEARANCEUR Clear 06/25/2015 1416   LABSPEC 1.009 12/15/2023 1116   LABSPEC 1.010 09/09/2013 1201   PHURINE 8.0 12/15/2023 1116   GLUCOSEU 50 (A) 12/15/2023 1116   GLUCOSEU Negative 09/09/2013 1201   HGBUR NEGATIVE 12/15/2023 1116   BILIRUBINUR NEGATIVE 12/15/2023 1116   BILIRUBINUR Negative 06/25/2015 1416   BILIRUBINUR Negative 09/09/2013 1201   KETONESUR 20 (A) 12/15/2023 1116   PROTEINUR NEGATIVE 12/15/2023 1116   NITRITE NEGATIVE 12/15/2023 1116   LEUKOCYTESUR MODERATE (A) 12/15/2023 1116   LEUKOCYTESUR 3+ 09/09/2013 1201   Sepsis Labs Recent Labs  Lab 12/20/23 0600 12/21/23 0501  WBC 8.9 8.0   Microbiology No results found for this or any previous visit (from the past 240 hours).   Time coordinating discharge: Over 30 minutes  SIGNED:   Calvin KATHEE Robson, MD  [facility] 12/26/2023, 10:21 AM Pager   If 7PM-7AM, please contact night-coverage

## 2023-12-26 NOTE — TOC Transition Note (Signed)
 Transition of Care [facility]) - Discharge Note   Patient Details  Name: Krystal Richardson MRN: [id number] Date of Birth: 1929-12-06  Transition of Care [facility]) CM/SW Contact:  Ladene Lady, LCSW Phone Number: 12/26/2023, 10:37 AM   Clinical Narrative:  Pt has orders to discharge home with hospice. DME delivered. Son notified. DNR signed on chart.    Final next level of care: Home w Hospice Care Barriers to Discharge: Barriers Resolved   Patient Goals and CMS Choice Patient states their goals for this hospitalization and ongoing recovery are:: home with hospice CMS Medicare.gov Compare Post Acute Care list provided to:: Patient        Discharge Placement                Patient to be transferred to facility by: [facility] Name of family member notified: Son robbie Patient and family notified of of transfer: 12/26/23  Discharge Plan and Services Additional resources added to the After Visit Summary for       Post Acute Care Choice: Skilled Nursing Facility                               Social Drivers of Health (SDOH) Interventions SDOH Screenings   Food Insecurity: No Food Insecurity (12/16/2023)  Housing: Unknown (12/16/2023)  Transportation Needs: No Transportation Needs (12/16/2023)  Utilities: Not At Risk (12/16/2023)  Social Connections: Unknown (12/25/2023)  Tobacco Use: Low Risk  (12/16/2023)     Readmission Risk Interventions     No data to display

## 2024-08-11 ENCOUNTER — Other Ambulatory Visit: Payer: Self-pay

## 2024-08-11 ENCOUNTER — Emergency Department

## 2024-08-11 ENCOUNTER — Observation Stay
Admission: EM | Admit: 2024-08-11 | Discharge: 2024-08-12 | Disposition: A | Discharge status: Home Health | Attending: Internal Medicine | Admitting: Internal Medicine

## 2024-08-11 DIAGNOSIS — F32A Depression, unspecified: Secondary | ICD-10-CM | POA: Diagnosis not present

## 2024-08-11 DIAGNOSIS — R55 Syncope and collapse: Principal | ICD-10-CM | POA: Diagnosis present

## 2024-08-11 DIAGNOSIS — E039 Hypothyroidism, unspecified: Secondary | ICD-10-CM | POA: Diagnosis present

## 2024-08-11 DIAGNOSIS — W19XXXA Unspecified fall, initial encounter: Secondary | ICD-10-CM

## 2024-08-11 DIAGNOSIS — R7989 Other specified abnormal findings of blood chemistry: Principal | ICD-10-CM | POA: Insufficient documentation

## 2024-08-11 DIAGNOSIS — I951 Orthostatic hypotension: Secondary | ICD-10-CM

## 2024-08-11 DIAGNOSIS — Z85820 Personal history of malignant melanoma of skin: Secondary | ICD-10-CM | POA: Diagnosis not present

## 2024-08-11 DIAGNOSIS — Z7982 Long term (current) use of aspirin: Secondary | ICD-10-CM | POA: Diagnosis not present

## 2024-08-11 DIAGNOSIS — I5A Non-ischemic myocardial injury (non-traumatic): Secondary | ICD-10-CM

## 2024-08-11 LAB — COMPREHENSIVE METABOLIC PANEL WITH GFR
ALT: 15 U/L (ref 0–44)
AST: 30 U/L (ref 15–41)
Albumin: 4.6 g/dL (ref 3.5–5.0)
Alkaline Phosphatase: 63 U/L (ref 38–126)
Anion gap: 14 (ref 5–15)
BUN: 22 mg/dL (ref 8–23)
CO2: 22 mmol/L (ref 22–32)
Calcium: 9.8 mg/dL (ref 8.9–10.3)
Chloride: 100 mmol/L (ref 98–111)
Creatinine, Ser: 0.95 mg/dL (ref 0.44–1.00)
GFR, Estimated: 56 mL/min — ABNORMAL LOW (ref >60)
Glucose, Bld: 103 mg/dL — ABNORMAL HIGH (ref 70–99)
Potassium: 3.7 mmol/L (ref 3.5–5.1)
Sodium: 136 mmol/L (ref 135–145)
Total Bilirubin: 1 mg/dL (ref 0.0–1.2)
Total Protein: 8.1 g/dL (ref 6.5–8.1)

## 2024-08-11 LAB — URINALYSIS, ROUTINE W REFLEX MICROSCOPIC
Bacteria, UA: NONE SEEN
Bilirubin Urine: NEGATIVE
Glucose, UA: NEGATIVE mg/dL
Hgb urine dipstick: NEGATIVE
Ketones, ur: NEGATIVE mg/dL
Nitrite: NEGATIVE
Protein, ur: NEGATIVE mg/dL
RBC / HPF: 0 RBC/hpf (ref 0–5)
Specific Gravity, Urine: 1.008 (ref 1.005–1.030)
pH: 7 (ref 5.0–8.0)

## 2024-08-11 LAB — CBC
HCT: 38.4 % (ref 36.0–46.0)
Hemoglobin: 13.2 g/dL (ref 12.0–15.0)
MCH: 30.6 pg (ref 26.0–34.0)
MCHC: 34.4 g/dL (ref 30.0–36.0)
MCV: 89.1 fL (ref 80.0–100.0)
Platelets: 199 K/uL (ref 150–400)
RBC: 4.31 MIL/uL (ref 3.87–5.11)
RDW: 14.3 % (ref 11.5–15.5)
WBC: 5.4 K/uL (ref 4.0–10.5)
nRBC: 0 % (ref 0.0–0.2)

## 2024-08-11 LAB — TROPONIN I (HIGH SENSITIVITY)
Troponin I (High Sensitivity): 22 ng/L — ABNORMAL HIGH (ref <18)
Troponin I (High Sensitivity): 58 ng/L — ABNORMAL HIGH (ref <18)

## 2024-08-11 LAB — CBG MONITORING, ED: Glucose-Capillary: 96 mg/dL (ref 70–99)

## 2024-08-11 MED ORDER — LACTATED RINGERS IV BOLUS
1000.0000 mL | Freq: Once | INTRAVENOUS | Status: AC
  Administered 2024-08-11: 1000 mL via INTRAVENOUS

## 2024-08-11 NOTE — ED Provider Triage Note (Signed)
 Emergency Medicine Provider Triage Evaluation Note  Krystal Richardson , Richardson 89 y.o. female  was evaluated in triage.  Pt complains of witnessed syncopal episode by family members w/ associated nausea and vomiting - resolved. No head injury per patient. No blood thinners. Left hand stated as sore. Hematoma noted. Dizziness prior to ED arrival - resolved.   Review of Systems  Positive:  Negative:   Physical Exam  BP (!) 174/75   Pulse 77   Temp 98.3 F (36.8 C)   Resp 17   Wt 45.8 kg   SpO2 100%   BMI 17.89 kg/m  Gen:   Awake, no distress  speaking in complete sentences Resp:  Normal effort LCTAB MSK:   Moves extremities without difficulty. Hematoma to left hand. FROM w/o difficulty.  Other:  Grip strength equal bilaterally. 4/5 UE & LE strength.   Medical Decision Making  Medically screening exam initiated at 3:41 PM.  Appropriate orders placed.  Krystal Richardson was informed that the remainder of the evaluation will be completed by another provider, this initial triage assessment does not replace that evaluation, and the importance of remaining in the ED until their evaluation is complete.     Margrette, Khayree Delellis A, PA-C 08/11/24 1547

## 2024-08-11 NOTE — ED Triage Notes (Signed)
 Arrived by [facility]. Witnessed syncopal episode by family at home with N/V. Also c/o dizziness. Hematoma noted to left hand.  EMS vitals: 106CBG 97.5oral 169/61 b/p  4mg  zofran  IM given

## 2024-08-11 NOTE — H&P (Signed)
 History and Physical   [facility] [place] @ [facility] Admission History and Physical [employer], D.O.    Patient Name: Krystal Richardson MR#: [id number] Date of Birth: 10/21/1929 Date of Admission: 08/11/2024  Referring MD/NP/PA: Dr. Waymond Primary Care Physician: [name], MD  Chief Complaint:  Chief Complaint  Patient presents with   Loss of Consciousness    HPI: Krystal Richardson is a 89 y.o. female with a known history of basal cell carcinoma, hyperlipidemia, osteoporosis, hyperlipidemia, hypothyroidism, depression, DVT, GERD presents to the emergency department for evaluation of syncope.  Patient was in a usual state of health until this evening when she had a witnessed syncopal episode at home.  Patient was reportedly trying to get out of her chair and lost consciousness for less than 1 minute.  Witnesses said that she slid out of the chair and when she came to about 1 minute later she was mildly confused, complained of dizziness and vomited once.  She does not recall the episode and has returned to her baseline mental status per family.  Patient states that she feels fine now and had a normal day prior to this episode.  Last echo from December 2024 showed LV ejection fraction 50 to 55% with low normal function of the left ventricle consistent with grade 1 diastolic dysfunction.  Of note she was hospitalized at that time for sepsis secondary to urinary tract infection and was placed on hospice however she returned home where she lives independently.  She denied any associated chest pain, shortness of breath, fevers, chills, recent illness, dysuria, urinary frequency, diarrhea.   Otherwise there has been no change in status. Patient has been taking medication as prescribed and there has been no recent change in medication or diet.  No recent antibiotics.  There has been no recent illness, hospitalizations, travel or sick contacts.    EMS/ED Course: Patient received LR. Medical  admission has been requested for further management of syncope, elevated troponin.  Review of Systems:  CONSTITUTIONAL: Positive weakness, dizziness.  No fever/chills, fatigue, weight gain/loss, headache. EYES: No blurry or double vision. ENT: No tinnitus, postnasal drip, redness or soreness of the oropharynx. RESPIRATORY: No cough, dyspnea, wheeze.  No hemoptysis.  CARDIOVASCULAR: No chest pain, palpitations, syncope, orthopnea. No lower extremity edema.  GASTROINTESTINAL: No nausea, vomiting, abdominal pain, diarrhea, constipation.  No hematemesis, melena or hematochezia. GENITOURINARY: No dysuria, frequency, hematuria. ENDOCRINE: No polyuria or nocturia. No heat or cold intolerance. HEMATOLOGY: No anemia, bruising, bleeding. INTEGUMENTARY: No rashes, ulcers, lesions. MUSCULOSKELETAL: No arthritis, gout. NEUROLOGIC: Positive LOC PSYCHIATRIC: No anxiety, depression, insomnia.   Past Medical History:  Diagnosis Date   Actinic keratosis 03/12/2019   mid post scalp melanoma graft scar   Basal cell carcinoma 08/04/2014   R forehead - excision 08/19/2014   BCC (basal cell carcinoma of skin) 02/20/2019   L dorsum foot   Hyperlipidemia    Melanoma (HCC) 2002   with chemo and rad tx   Personal history of chemotherapy 2003   MELANOMA   Personal history of radiation therapy 2002   MELANOMA   Stomach ulcer    Urethral caruncle     Past Surgical History:  Procedure Laterality Date   BREAST EXCISIONAL BIOPSY Left 1986   EXCISIONAL - NEG   cataract     hemmriodectomy  1980   melanoma  2002     reports that she has never smoked. She has never used smokeless tobacco. She reports that she does not drink alcohol  and does not use drugs.  Allergies  Allergen Reactions   Toprol Xl [Metoprolol Tartrate]     Family History  Problem Relation Age of Onset   Kidney disease Neg Hx    Bladder Cancer Neg Hx    Prostate cancer Neg Hx    Breast cancer Neg Hx     Prior to Admission  medications   Medication Sig Start Date End Date Taking? Authorizing Provider  DULoxetine  (CYMBALTA ) 30 MG capsule Take 1 capsule (30 mg total) by mouth daily. 07/27/21 01/06/22  [name], MD  feeding supplement (ENSURE ENLIVE / ENSURE PLUS) LIQD Take 237 mLs by mouth 2 (two) times daily between meals. 07/26/21   [name], MD  traMADol  (ULTRAM ) 50 MG tablet Take 1 tablet (50 mg total) by mouth every 6 (six) hours as needed for severe pain (pain score 7-10). 12/26/23   Jhonny Calvin NOVAK, MD    Physical Exam: Vitals:   08/11/24 1841 08/11/24 1843 08/11/24 2018 08/11/24 2130  BP: (!) 151/61   (!) 147/67  Pulse: 75   78  Resp: (!) 24   (!) 27  Temp:   98.2 F (36.8 C)   TempSrc:   Oral   SpO2: 100%   100%  Weight:  49.9 kg    Height:  5' 6 (1.676 m)      GENERAL: 89 y.o.-year-old white female patient, well-developed, well-nourished lying in the bed in no acute distress.  Pleasant and cooperative.   HEENT: Head atraumatic, normocephalic. Pupils equal. Mucus membranes moist. NECK: Supple. No JVD.  Positive bruit bilaterally CHEST: Normal breath sounds bilaterally. No wheezing, rales, rhonchi or crackles. No use of accessory muscles of respiration.  No reproducible chest wall tenderness.  CARDIOVASCULAR: S1, S2 normal. No murmurs, rubs, or gallops. Cap refill <2 seconds. Pulses intact distally.  ABDOMEN: Soft, nondistended, nontender. No rebound, guarding, rigidity. Normoactive bowel sounds present in all four quadrants.  EXTREMITIES: No pedal edema, cyanosis, or clubbing. No calf tenderness or Homan's sign.  Left hand hematoma posteriorly NEUROLOGIC: The patient is alert and oriented x 3. Cranial nerves II through XII are grossly intact with no focal sensorimotor deficit. PSYCHIATRIC:  Normal affect, mood, thought content. SKIN: Warm, dry, and intact without obvious rash, lesion, or ulcer.    Labs on Admission:  CBC: Recent Labs  Lab 08/11/24 1541  WBC 5.4  HGB 13.2  HCT  38.4  MCV 89.1  PLT 199   Basic Metabolic Panel: Recent Labs  Lab 08/11/24 1541  NA 136  K 3.7  CL 100  CO2 22  GLUCOSE 103*  BUN 22  CREATININE 0.95  CALCIUM  9.8   GFR: Estimated Creatinine Clearance: 29.1 mL/min (by C-G formula based on SCr of 0.95 mg/dL). Liver Function Tests: Recent Labs  Lab 08/11/24 1541  AST 30  ALT 15  ALKPHOS 63  BILITOT 1.0  PROT 8.1  ALBUMIN 4.6   No results for input(s): LIPASE, AMYLASE in the last 168 hours. No results for input(s): AMMONIA in the last 168 hours. Coagulation Profile: No results for input(s): INR, PROTIME in the last 168 hours. Cardiac Enzymes: No results for input(s): CKTOTAL, CKMB, CKMBINDEX, TROPONINI in the last 168 hours. BNP (last 3 results) No results for input(s): PROBNP in the last 8760 hours. HbA1C: No results for input(s): HGBA1C in the last 72 hours. CBG: Recent Labs  Lab 08/11/24 1549  GLUCAP 96   Lipid Profile: No results for input(s): CHOL, HDL, LDLCALC, TRIG, CHOLHDL, LDLDIRECT in the  last 72 hours. Thyroid  Function Tests: No results for input(s): TSH, T4TOTAL, FREET4, T3FREE, THYROIDAB in the last 72 hours. Anemia Panel: No results for input(s): VITAMINB12, FOLATE, FERRITIN, TIBC, IRON, RETICCTPCT in the last 72 hours. Urine analysis:    Component Value Date/Time   COLORURINE STRAW (A) 08/11/2024 1541   APPEARANCEUR CLEAR (A) 08/11/2024 1541   APPEARANCEUR Clear 06/25/2015 1416   LABSPEC 1.008 08/11/2024 1541   LABSPEC 1.010 09/09/2013 1201   PHURINE 7.0 08/11/2024 1541   GLUCOSEU NEGATIVE 08/11/2024 1541   GLUCOSEU Negative 09/09/2013 1201   HGBUR NEGATIVE 08/11/2024 1541   BILIRUBINUR NEGATIVE 08/11/2024 1541   BILIRUBINUR Negative 06/25/2015 1416   BILIRUBINUR Negative 09/09/2013 1201   KETONESUR NEGATIVE 08/11/2024 1541   PROTEINUR NEGATIVE 08/11/2024 1541   NITRITE NEGATIVE 08/11/2024 1541   LEUKOCYTESUR SMALL (A) 08/11/2024  1541   LEUKOCYTESUR 3+ 09/09/2013 1201   Sepsis Labs: @LABRCNTIP (procalcitonin:4,lacticidven:4) )No results found for this or any previous visit (from the past 240 hours).   Radiological Exams on Admission: CT Head Wo Contrast Result Date: 08/11/2024 CLINICAL DATA:  Syncopal episode EXAM: CT HEAD WITHOUT CONTRAST CT CERVICAL SPINE WITHOUT CONTRAST TECHNIQUE: Multidetector CT imaging of the head and cervical spine was performed following the standard protocol without intravenous contrast. Multiplanar CT image reconstructions of the cervical spine were also generated. RADIATION DOSE REDUCTION: This exam was performed according to the departmental dose-optimization program which includes automated exposure control, adjustment of the mA and/or kV according to patient size and/or use of iterative reconstruction technique. COMPARISON:  CT brain and cervical spine 12/15/2023 FINDINGS: CT HEAD FINDINGS Brain: No acute territorial infarction, hemorrhage or intracranial mass. Atrophy and chronic small vessel ischemic changes of the white matter. Chronic lacunar infarcts in the basal ganglia. Stable ventricle size Vascular: No hyperdense vessels.  Carotid vascular calcification Skull: No fracture. Large area of erosive change at the posterior frontal bone with overlying scalp defect. This is grossly stable compared with CT from December, new compared with more remote CT from 2014. Sinuses/Orbits: No acute finding. Other: None CT CERVICAL SPINE FINDINGS Alignment: Stable alignment. No abnormal listhesis. Facet alignment is normal Skull base and vertebrae: No acute fracture. No primary bone lesion or focal pathologic process. Soft tissues and spinal canal: No prevertebral fluid or swelling. No visible canal hematoma. Disc levels: Advanced C1-C2 degenerative change. Moderate disc space narrowing C5-C6 and C6-C7. No high-grade canal stenosis. Facet degenerative changes at multiple levels with foraminal narrowing. Upper  chest: No acute changes. Left thyroid  nodule measuring up to 1.2 cm, no imaging follow-up is recommended Other: None IMPRESSION: 1. No CT evidence for acute intracranial abnormality. Atrophy and chronic small vessel ischemic changes of the white matter. 2. Stable alignment of the cervical spine. No acute osseous abnormality. 3. Erosive change at the posterior frontal bone with overlying scalp defect. This is grossly stable compared with CT from December, new compared with more remote CT from 2014. Correlate with direct visualization. Electronically Signed   By: Luke Bun M.D.   On: 08/11/2024 19:08   CT Cervical Spine Wo Contrast Result Date: 08/11/2024 CLINICAL DATA:  Syncopal episode EXAM: CT HEAD WITHOUT CONTRAST CT CERVICAL SPINE WITHOUT CONTRAST TECHNIQUE: Multidetector CT imaging of the head and cervical spine was performed following the standard protocol without intravenous contrast. Multiplanar CT image reconstructions of the cervical spine were also generated. RADIATION DOSE REDUCTION: This exam was performed according to the departmental dose-optimization program which includes automated exposure control, adjustment of the mA and/or kV according  to patient size and/or use of iterative reconstruction technique. COMPARISON:  CT brain and cervical spine 12/15/2023 FINDINGS: CT HEAD FINDINGS Brain: No acute territorial infarction, hemorrhage or intracranial mass. Atrophy and chronic small vessel ischemic changes of the white matter. Chronic lacunar infarcts in the basal ganglia. Stable ventricle size Vascular: No hyperdense vessels.  Carotid vascular calcification Skull: No fracture. Large area of erosive change at the posterior frontal bone with overlying scalp defect. This is grossly stable compared with CT from December, new compared with more remote CT from 2014. Sinuses/Orbits: No acute finding. Other: None CT CERVICAL SPINE FINDINGS Alignment: Stable alignment. No abnormal listhesis. Facet  alignment is normal Skull base and vertebrae: No acute fracture. No primary bone lesion or focal pathologic process. Soft tissues and spinal canal: No prevertebral fluid or swelling. No visible canal hematoma. Disc levels: Advanced C1-C2 degenerative change. Moderate disc space narrowing C5-C6 and C6-C7. No high-grade canal stenosis. Facet degenerative changes at multiple levels with foraminal narrowing. Upper chest: No acute changes. Left thyroid  nodule measuring up to 1.2 cm, no imaging follow-up is recommended Other: None IMPRESSION: 1. No CT evidence for acute intracranial abnormality. Atrophy and chronic small vessel ischemic changes of the white matter. 2. Stable alignment of the cervical spine. No acute osseous abnormality. 3. Erosive change at the posterior frontal bone with overlying scalp defect. This is grossly stable compared with CT from December, new compared with more remote CT from 2014. Correlate with direct visualization. Electronically Signed   By: Luke Bun M.D.   On: 08/11/2024 19:08   DG Hand Complete Left Result Date: 08/11/2024 CLINICAL DATA:  Pain, syncopal event. EXAM: LEFT HAND - COMPLETE 3+ VIEW COMPARISON:  None Available. FINDINGS: There is no evidence of fracture or dislocation. The bones are subjectively under mineralized. Mild multifocal osteoarthritis. Chondrocalcinosis of the triangular fibrocartilage. Soft tissues are unremarkable. IMPRESSION: 1. No acute fracture or subluxation of the left hand. 2. Mild multifocal osteoarthritis. Electronically Signed   By: Andrea Gasman M.D.   On: 08/11/2024 18:58   DG Chest 1 View Result Date: 08/11/2024 CLINICAL DATA:  Fall, syncope. EXAM: CHEST  1 VIEW COMPARISON:  12/17/2023 FINDINGS: The cardiomediastinal contours are normal. The lungs are clear. Pulmonary vasculature is normal. No consolidation, pleural effusion, or pneumothorax. The bones are subjectively under mineralized. No acute osseous abnormalities are seen. IMPRESSION:  No active disease. Electronically Signed   By: Andrea Gasman M.D.   On: 08/11/2024 18:57    EKG: Normal sinus rhythm at 82 bpm with normal axis and nonspecific ST-T wave changes.   Assessment/Plan  This is a 89 y.o. female with a history of basal cell carcinoma, hyperlipidemia, osteoporosis, hyperlipidemia, hypothyroidism, depression, DVT, GERD  now being admitted with:  #. Syncope likely secondary to orthostatic changes - Admit observation with telemetry monitoring - IV fluid hydration - Check orthostatics - Check echo and carotids given bruit observed on physical exam - Cardio consult   #. Elevated troponin without chest pain -Aspirin  325 x 1 and 81 mg daily - Trend trops, check TSH, lipids  #.  History of depression - Continue duloxetine   #.  History of hypothyroidism, hyperlipidemia, GERD Home medications will need to be reconciled  Admission status: Observation, telemetry IV Fluids: NS Diet/Nutrition: Heart healthy Consults called: Cardiology DVT Px: Lovenox , SCDs and early ambulation. Code Status: Full Code-patient has prior DNR but states today that she would like to have everything done and if needed we can call her son Harden who is  her healthcare POA Disposition Plan: To home in less than 24 hours      All the records are reviewed and case discussed with ED provider. Management plans discussed with the patient and/or family who express understanding and agree with plan of care.  Alyzabeth Pontillo D.O. on 08/11/2024 at 9:45 PM CC: Primary care physician; [name], MD   08/11/2024, 9:45 PM

## 2024-08-11 NOTE — ED Provider Notes (Signed)
 [name] Provider Note    Event Date/Time   First MD Initiated Contact with Patient 08/11/24 1746     (approximate)   History   Loss of Consciousness   HPI  Krystal Richardson is a 89 y.o. female hyperlipidemia, GERD, presenting with syncopal episode.  Patient was try to get out of recliner, states that she slipped off the recliner, does not remember what happened but family at bedside said that it was reported that she may have passed out.  Lasted for less than a minute, no seizure-like activity or incontinence.  Patient has some pain to her left hand as well as some bruising but no other complaints.  She denies any headache, vision changes, weakness, numbness, urinary symptoms, no nausea vomiting diarrhea, no chest pain or shortness of breath.  Independent history obtained from family as above.  They feel that she is at her mental baseline at this time.  On independent review, she was admitted in 2024 for for generalized weakness and a fall, had a UTI and dehydration.  Family states that her presentation during that admission is significantly different from this sensation.     Physical Exam   Triage Vital Signs: ED Triage Vitals  Encounter Vitals Group     BP 08/11/24 1539 (!) 174/75     Girls Systolic BP Percentile --      Girls Diastolic BP Percentile --      Boys Systolic BP Percentile --      Boys Diastolic BP Percentile --      Pulse Rate 08/11/24 1539 77     Resp 08/11/24 1539 17     Temp 08/11/24 1539 98.3 F (36.8 C)     Temp src --      SpO2 08/11/24 1539 100 %     Weight 08/11/24 1538 101 lb (45.8 kg)     Height --      Head Circumference --      Peak Flow --      Pain Score 08/11/24 1537 0     Pain Loc --      Pain Education --      Exclude from Growth Chart --     Most recent vital signs: Vitals:   08/11/24 2018 08/11/24 2130  BP:  (!) 147/67  Pulse:  78  Resp:  (!) 27  Temp: 98.2 F (36.8 C)   SpO2:  100%      General: Awake, no distress.  CV:  Good peripheral perfusion.  Resp:  Normal effort.  No tachypnea or respiratory distress, no thoracic cage tenderness Abd:  No distention.  Soft nontender Other:  No palpable scalp tenderness or facial tenderness, no midline spinal tenderness, full range of motion of all extremities are intact, she does have ecchymoses to her dorsal left hand with some tenderness, no scaphoid tenderness.  Able to fully range her fingers, grip strength and sensation is intact, she is equal DP pulses bilaterally.  No focal lower extremity deficits.   ED Results / Procedures / Treatments   Labs (all labs ordered are listed, but only abnormal results are displayed) Labs Reviewed  COMPREHENSIVE METABOLIC PANEL WITH GFR - Abnormal; Notable for the following components:      Result Value   Glucose, Bld 103 (*)    GFR, Estimated 56 (*)    All other components within normal limits  URINALYSIS, ROUTINE W REFLEX MICROSCOPIC - Abnormal; Notable for the following components:   Color,  Urine STRAW (*)    APPearance CLEAR (*)    Leukocytes,Ua SMALL (*)    All other components within normal limits  TROPONIN I (HIGH SENSITIVITY) - Abnormal; Notable for the following components:   Troponin I (High Sensitivity) 22 (*)    All other components within normal limits  TROPONIN I (HIGH SENSITIVITY) - Abnormal; Notable for the following components:   Troponin I (High Sensitivity) 58 (*)    All other components within normal limits  CBC  CBG MONITORING, ED     EKG  EKG shows, sinus rhythm, rate 82, normal QS, normal QTc, T wave flattening in aVL, no obvious ischemic ST elevation, not significantly compared to prior   RADIOLOGY On my independent interpretation, CT without obvious intracranial hemorrhage   PROCEDURES:  Critical Care performed: No  Procedures   MEDICATIONS ORDERED IN ED: Medications  lactated ringers  bolus 1,000 mL (0 mLs Intravenous Stopped 08/11/24 2133)      IMPRESSION / MDM / ASSESSMENT AND PLAN / ED COURSE  I reviewed the triage vital signs and the nursing notes.                              Differential diagnosis includes, but is not limited to, arrhythmia, atypical ACS, dehydration, electrolyte derangements, UTI, fracture, intracranial hemorrhage.  Get labs, EKG, troponin, chest x-ray, CT head, cervical spine, hand x-ray.  Will give her some IV fluids.  Patient's presentation is most consistent with acute presentation with potential threat to life or bodily function.  Independent interpretation of labs and imaging below.  Given her troponin elevation, she will need to be admitted for further management.  Consulted hospitalist was agreeable with plan for admission and will evaluate the patient.  She is admitted.  The patient is on the cardiac monitor to evaluate for evidence of arrhythmia and/or significant heart rate changes.   Clinical Course as of 08/11/24 2158  Sun Aug 11, 2024  8156 Independent review of labs, no leukocytosis, UA shows leuk esterase but 0-5 WBCs, no bacteria, not consistent with UTI, electrolytes not severely deranged, initial troponin is mildly elevated.  Will get a repeat troponin. [TT]  2112 Troponin I (High Sensitivity)(!) Patient second troponin doubled.  Will plan to bring her in for serial troponins as well as telemetry for syncopal episode. [TT]  2113 DG Chest 1 View No active disease.  [TT]  2113 DG Hand Complete Left IMPRESSION: 1. No acute fracture or subluxation of the left hand. 2. Mild multifocal osteoarthritis.   [TT]  2113 CT Cervical Spine Wo Contrast [TT]  2113 CT Head Wo Contrast IMPRESSION: 1. No CT evidence for acute intracranial abnormality. Atrophy and chronic small vessel ischemic changes of the white matter. 2. Stable alignment of the cervical spine. No acute osseous abnormality. 3. Erosive change at the posterior frontal bone with overlying scalp defect. This is grossly stable  compared with CT from December, new compared with more remote CT from 2014. Correlate with direct visualization.   [TT]    Clinical Course User Index [TT] Waymond Lorelle Cummins, MD     FINAL CLINICAL IMPRESSION(S) / ED DIAGNOSES   Final diagnoses:  Fall, initial encounter  Syncope, unspecified syncope type  Elevated troponin     Rx / DC Orders   ED Discharge Orders     None        Note:  This document was prepared using Dragon voice recognition software and may  include unintentional dictation errors.    Waymond Lorelle Cummins, MD 08/11/24 2158

## 2024-08-12 ENCOUNTER — Observation Stay

## 2024-08-12 DIAGNOSIS — I5A Non-ischemic myocardial injury (non-traumatic): Secondary | ICD-10-CM

## 2024-08-12 DIAGNOSIS — E039 Hypothyroidism, unspecified: Secondary | ICD-10-CM

## 2024-08-12 DIAGNOSIS — Z85820 Personal history of malignant melanoma of skin: Secondary | ICD-10-CM

## 2024-08-12 DIAGNOSIS — F3289 Other specified depressive episodes: Secondary | ICD-10-CM | POA: Diagnosis not present

## 2024-08-12 DIAGNOSIS — I951 Orthostatic hypotension: Secondary | ICD-10-CM

## 2024-08-12 DIAGNOSIS — R55 Syncope and collapse: Secondary | ICD-10-CM | POA: Diagnosis not present

## 2024-08-12 LAB — CBC
HCT: 36.6 % (ref 36.0–46.0)
Hemoglobin: 13 g/dL (ref 12.0–15.0)
MCH: 30.7 pg (ref 26.0–34.0)
MCHC: 35.5 g/dL (ref 30.0–36.0)
MCV: 86.5 fL (ref 80.0–100.0)
Platelets: 200 K/uL (ref 150–400)
RBC: 4.23 MIL/uL (ref 3.87–5.11)
RDW: 14.4 % (ref 11.5–15.5)
WBC: 10 K/uL (ref 4.0–10.5)
nRBC: 0 % (ref 0.0–0.2)

## 2024-08-12 LAB — LIPID PANEL
Cholesterol: 189 mg/dL (ref 0–200)
HDL: 53 mg/dL (ref >40)
LDL Cholesterol: 127 mg/dL — ABNORMAL HIGH (ref 0–99)
Total CHOL/HDL Ratio: 3.6 ratio
Triglycerides: 45 mg/dL (ref <150)
VLDL: 9 mg/dL (ref 0–40)

## 2024-08-12 LAB — TROPONIN I (HIGH SENSITIVITY)
Troponin I (High Sensitivity): 47 ng/L — ABNORMAL HIGH (ref <18)
Troponin I (High Sensitivity): 33 ng/L — ABNORMAL HIGH (ref <18)
Troponin I (High Sensitivity): 35 ng/L — ABNORMAL HIGH (ref <18)
Troponin I (High Sensitivity): 20 ng/L — ABNORMAL HIGH (ref <18)

## 2024-08-12 LAB — MAGNESIUM: Magnesium: 2.1 mg/dL (ref 1.7–2.4)

## 2024-08-12 LAB — CREATININE, SERUM
Creatinine, Ser: 0.79 mg/dL (ref 0.44–1.00)
GFR, Estimated: >60 mL/min (ref >60)

## 2024-08-12 LAB — PHOSPHORUS: Phosphorus: 3.6 mg/dL (ref 2.5–4.6)

## 2024-08-12 LAB — TSH: TSH: 2.183 u[IU]/mL (ref 0.350–4.500)

## 2024-08-12 MED ORDER — ACETAMINOPHEN 650 MG RE SUPP: 650.0000 mg | Freq: Four times a day (QID) | RECTAL | Status: DC | PRN

## 2024-08-12 MED ORDER — ENOXAPARIN SODIUM 30 MG/0.3ML IJ SOSY
30.0000 mg | PREFILLED_SYRINGE | INTRAMUSCULAR | Status: DC
  Administered 2024-08-12: 30 mg via SUBCUTANEOUS
  Filled 2024-08-12: qty 0.3

## 2024-08-12 MED ORDER — ONDANSETRON HCL 4 MG/2ML IJ SOLN: 4.0000 mg | Freq: Four times a day (QID) | INTRAMUSCULAR | Status: DC | PRN

## 2024-08-12 MED ORDER — SODIUM CHLORIDE 0.9 % IV SOLN: INTRAVENOUS | Status: DC

## 2024-08-12 MED ORDER — ACETAMINOPHEN 325 MG PO TABS: 650.0000 mg | ORAL_TABLET | Freq: Four times a day (QID) | ORAL | Status: DC | PRN

## 2024-08-12 MED ORDER — ASPIRIN 81 MG PO TBEC
81.0000 mg | DELAYED_RELEASE_TABLET | Freq: Every day | ORAL | Status: DC
  Administered 2024-08-12: 81 mg via ORAL
  Filled 2024-08-12: qty 1

## 2024-08-12 MED ORDER — HYDROCODONE-ACETAMINOPHEN 5-325 MG PO TABS: 1.0000 | ORAL_TABLET | ORAL | Status: DC | PRN

## 2024-08-12 MED ORDER — ASPIRIN 325 MG PO TABS
325.0000 mg | ORAL_TABLET | Freq: Once | ORAL | Status: AC
  Administered 2024-08-12: 325 mg via ORAL
  Filled 2024-08-12: qty 1

## 2024-08-12 MED ORDER — ALUM & MAG HYDROXIDE-SIMETH 200-200-20 MG/5ML PO SUSP: 30.0000 mL | Freq: Four times a day (QID) | ORAL | Status: DC | PRN

## 2024-08-12 MED ORDER — ROSUVASTATIN CALCIUM 20 MG PO TABS: 10.0000 mg | ORAL_TABLET | Freq: Every day | ORAL | Status: DC

## 2024-08-12 MED ORDER — BISACODYL 5 MG PO TBEC: 5.0000 mg | DELAYED_RELEASE_TABLET | Freq: Every day | ORAL | Status: DC | PRN

## 2024-08-12 MED ORDER — ONDANSETRON HCL 4 MG PO TABS
4.0000 mg | ORAL_TABLET | Freq: Four times a day (QID) | ORAL | Status: DC | PRN
Start: 2024-08-12 — End: 2024-08-12

## 2024-08-12 MED ORDER — SODIUM CHLORIDE 0.9% FLUSH: 3.0000 mL | Freq: Two times a day (BID) | INTRAVENOUS | Status: DC

## 2024-08-12 MED ORDER — SENNOSIDES-DOCUSATE SODIUM 8.6-50 MG PO TABS: 1.0000 | ORAL_TABLET | Freq: Every evening | ORAL | Status: DC | PRN

## 2024-08-12 MED ORDER — ASPIRIN 81 MG PO TBEC: 81.0000 mg | DELAYED_RELEASE_TABLET | Freq: Every day | ORAL | 0 refills | Status: AC

## 2024-08-12 MED ORDER — DULOXETINE HCL 30 MG PO CPEP
30.0000 mg | ORAL_CAPSULE | Freq: Every day | ORAL | Status: DC
  Administered 2024-08-12: 30 mg via ORAL
  Filled 2024-08-12: qty 1

## 2024-08-12 NOTE — Assessment & Plan Note (Signed)
 Patient was hydrated out.  Patient advised to go from a lying position to a seated position and then stand up before walking stand for few minutes make sure that she is stable.  TED hose prescribed.  Advised to stay hydrated.

## 2024-08-12 NOTE — Assessment & Plan Note (Signed)
 On Synthroid

## 2024-08-12 NOTE — Consult Note (Signed)
 [facility] CLINIC CARDIOLOGY CONSULT NOTE       Patient ID: Krystal Richardson MRN: [id number] DOB/AGE: 88/06/1929 88 y.o.  Admit date: 08/11/2024 Referring Physician Dr. Florie Primary Physician [name], MD Primary Cardiologist None Reason for Consultation reported syncope, elevated trops  HPI: Krystal Richardson is a 89 y.o. female  with a past medical history of hyperlipidemia, hypothyroidism, osteoporosis, basal cell carcinoma, history of DVT, GERD who presented to the ED on 08/11/2024 after falling from her chair. Patient states she got up from her chair and felt weak in legs and arms and slide down to floor. Patient denies any LOC/syncope. Patient denies any chest pain, palpitations, lightheadedness/dizziness or SOB.  Cardiology was consulted for further evaluation.   Work up in the ED notable for sodium 136, potassium 3.7, mag 2.1, creatinine 0.95, hemoglobin 13.2, platelets 199.  TSH within normal limits.  CXR without acute cardiopulmonary disease.  Carotid bilateral ultrasound reveals mild calcification of carotid bulbs with Doppler, less than 50% stenosis.  EKG with sinus rhythm, rate 82 bpm without acute ischemic changes.  Troponins minimally elevated and flat 22 > 58 > 47 > 33 > 35.  Lipid panel revealed LDL of 127. Patient started on IVFs.  At the time of my evaluation this AM, patient was resting comfortably in ED stretcher. We discussed patients sxs in further detail. Patient states she was sitting in a chair and tried to stand up and felt weakness in arms and legs and slid down chair. Patient denies any LOC/syncope. Denies any chest pain, palpitations, SOB, lightheadedness or dizziness. Patient states she feels overall great and very eager to go home.    Review of systems complete and found to be negative unless listed above    Past Medical History:  Diagnosis Date   Actinic keratosis 03/12/2019   mid post scalp melanoma graft scar   Basal cell carcinoma 08/04/2014   R  forehead - excision 08/19/2014   BCC (basal cell carcinoma of skin) 02/20/2019   L dorsum foot   Hyperlipidemia    Melanoma (HCC) 2002   with chemo and rad tx   Personal history of chemotherapy 2003   MELANOMA   Personal history of radiation therapy 2002   MELANOMA   Stomach ulcer    Urethral caruncle     Past Surgical History:  Procedure Laterality Date   BREAST EXCISIONAL BIOPSY Left 1986   EXCISIONAL - NEG   cataract     hemmriodectomy  1980   melanoma  2002    (Not in a hospital admission)  Social History   Socioeconomic History   Marital status: Widowed    Spouse name: Not on file   Number of children: Not on file   Years of education: Not on file   Highest education level: Not on file  Occupational History   Not on file  Tobacco Use   Smoking status: Never   Smokeless tobacco: Never  Vaping Use   Vaping status: Never Used  Substance and Sexual Activity   Alcohol use: No    Alcohol/week: 0.0 standard drinks of alcohol   Drug use: No   Sexual activity: Not on file  Other Topics Concern   Not on file  Social History Narrative   Not on file   Social Drivers of Health   Financial Resource Strain: Low Risk  (05/08/2024)   Received from [facility]   Overall Financial Resource Strain (CARDIA)    Difficulty of Paying Living Expenses:  Not hard at all  Food Insecurity: No Food Insecurity (05/08/2024)   Received from [facility]   Hunger Vital Sign    Within the past 12 months, you worried that your food would run out before you got the money to buy more.: Never true    Within the past 12 months, the food you bought just didn't last and you didn't have money to get more.: Never true  Transportation Needs: No Transportation Needs (05/08/2024)   Received from [facility]   PRAPARE - Transportation    Lack of Transportation (Non-Medical): No    In the past 12 months, has lack of transportation kept you from  medical appointments or from getting medications?: No  Physical Activity: Not on file  Stress: Not on file  Social Connections: Unknown (12/25/2023)   Social Connection and Isolation Panel    Frequency of Communication with Friends and Family: Once a week    Frequency of Social Gatherings with Friends and Family: Once a week    Attends Religious Services: 1 to 4 times per year    Active Member of [employer] or Organizations: Yes    Attends Banker Meetings: 1 to 4 times per year    Marital Status: Not on file  Intimate Partner Violence: Not At Risk (12/16/2023)   Humiliation, Afraid, Rape, and Kick questionnaire    Fear of Current or Ex-Partner: No    Emotionally Abused: No    Physically Abused: No    Sexually Abused: No    Family History  Problem Relation Age of Onset   Kidney disease Neg Hx    Bladder Cancer Neg Hx    Prostate cancer Neg Hx    Breast cancer Neg Hx      Vitals:   08/12/24 0600 08/12/24 0800 08/12/24 0913 08/12/24 1036  BP: (!) 121/92 129/63 (!) 146/52   Pulse: 85 75 96   Resp: 20  19   Temp: 97.8 F (36.6 C)   98 F (36.7 C)  TempSrc: Oral   Oral  SpO2: 100% 100% 100%   Weight:      Height:        PHYSICAL EXAM General: Well appearing elderly female, well nourished, in no acute distress. HEENT: Normocephalic and atraumatic. Neck: No JVD.   Lungs: Normal respiratory effort on room air. Clear bilaterally to auscultation. No wheezes, crackles, rhonchi.  Heart: HRRR. Normal S1 and S2 without gallops or murmurs.  Abdomen: Non-distended appearing.  Msk: Normal strength and tone for age. Extremities: Warm and well perfused. No clubbing, cyanosis, edema.  Neuro: Alert and oriented X 3. Psych: Answers questions appropriately.   Labs: Basic Metabolic Panel: Recent Labs    08/11/24 1541 08/12/24 0519  NA 136  --   K 3.7  --   CL 100  --   CO2 22  --   GLUCOSE 103*  --   BUN 22  --   CREATININE 0.95 0.79  CALCIUM  9.8  --   MG  --  2.1   PHOS  --  3.6   Liver Function Tests: Recent Labs    08/11/24 1541  AST 30  ALT 15  ALKPHOS 63  BILITOT 1.0  PROT 8.1  ALBUMIN 4.6   No results for input(s): LIPASE, AMYLASE in the last 72 hours. CBC: Recent Labs    08/11/24 1541 08/12/24 0607  WBC 5.4 10.0  HGB 13.2 13.0  HCT 38.4 36.6  MCV 89.1 86.5  PLT 199 200   Cardiac Enzymes: Recent Labs    08/12/24 0519 08/12/24 0607 08/12/24 0918  TROPONINIHS 33* 35* 20*   BNP: No results for input(s): BNP in the last 72 hours. D-Dimer: No results for input(s): DDIMER in the last 72 hours. Hemoglobin A1C: No results for input(s): HGBA1C in the last 72 hours. Fasting Lipid Panel: Recent Labs    08/12/24 0519  CHOL 189  HDL 53  LDLCALC 127*  TRIG 45  CHOLHDL 3.6   Thyroid  Function Tests: Recent Labs    08/12/24 0519  TSH 2.183   Anemia Panel: No results for input(s): VITAMINB12, FOLATE, FERRITIN, TIBC, IRON, RETICCTPCT in the last 72 hours.   Radiology: US  Carotid Bilateral Result Date: 08/12/2024 CLINICAL DATA:  Syncope and collapse. Hyperlipidemia. Asymptomatic bruit. EXAM: BILATERAL CAROTID DUPLEX ULTRASOUND TECHNIQUE: Elnor scale imaging, color Doppler and duplex ultrasound were performed of bilateral carotid and vertebral arteries in the neck. COMPARISON:  09/10/2013 FINDINGS: Criteria: Quantification of carotid stenosis is based on velocity parameters that correlate the residual internal carotid diameter with NASCET-based stenosis levels, using the diameter of the distal internal carotid lumen as the denominator for stenosis measurement. The following velocity measurements were obtained: RIGHT ICA: 73/25 cm/sec CCA: 87/12 cm/sec SYSTOLIC ICA/CCA RATIO:  0.8 ECA: 78/9 cm/sec LEFT ICA: 78/9 cm/sec CCA: 58/6 cm/sec SYSTOLIC ICA/CCA RATIO:  1.3 ECA: 264 cm/sec RIGHT CAROTID ARTERY: Mild calcified plaque of the carotid bulbs. RIGHT VERTEBRAL ARTERY:  Antegrade flow. LEFT CAROTID ARTERY:  Mild  calcified plaque of the carotid bulb. LEFT VERTEBRAL ARTERY:  Antegrade flow. IMPRESSION: Mild calcified atheromatous plaque of the carotid bulbs with Doppler measurements consistent with less than 50% stenosis. Electronically Signed   By: Aliene Lloyd M.D.   On: 08/12/2024 09:44   X-ray chest PA and lateral Result Date: 08/12/2024 CLINICAL DATA:  Syncope EXAM: CHEST - 2 VIEW COMPARISON:  08/11/2024 FINDINGS: Lungs are hyperexpanded. The lungs are clear without focal pneumonia, edema, pneumothorax or pleural effusion. The cardiopericardial silhouette is within normal limits for size. No acute bony abnormality. Telemetry leads overlie the chest. IMPRESSION: Hyperexpansion without acute cardiopulmonary findings. Electronically Signed   By: Camellia Candle M.D.   On: 08/12/2024 06:25   CT Head Wo Contrast Result Date: 08/11/2024 CLINICAL DATA:  Syncopal episode EXAM: CT HEAD WITHOUT CONTRAST CT CERVICAL SPINE WITHOUT CONTRAST TECHNIQUE: Multidetector CT imaging of the head and cervical spine was performed following the standard protocol without intravenous contrast. Multiplanar CT image reconstructions of the cervical spine were also generated. RADIATION DOSE REDUCTION: This exam was performed according to the departmental dose-optimization program which includes automated exposure control, adjustment of the mA and/or kV according to patient size and/or use of iterative reconstruction technique. COMPARISON:  CT brain and cervical spine 12/15/2023 FINDINGS: CT HEAD FINDINGS Brain: No acute territorial infarction, hemorrhage or intracranial mass. Atrophy and chronic small vessel ischemic changes of the white matter. Chronic lacunar infarcts in the basal ganglia. Stable ventricle size Vascular: No hyperdense vessels.  Carotid vascular calcification Skull: No fracture. Large area of erosive change at the posterior frontal bone with overlying scalp defect. This is grossly stable compared with CT from December, new  compared with more remote CT from 2014. Sinuses/Orbits: No acute finding. Other: None CT CERVICAL SPINE FINDINGS Alignment: Stable alignment. No abnormal listhesis. Facet alignment is normal Skull base and vertebrae: No acute fracture. No primary bone lesion or focal pathologic process. Soft tissues and spinal canal: No prevertebral fluid or swelling. No visible canal  hematoma. Disc levels: Advanced C1-C2 degenerative change. Moderate disc space narrowing C5-C6 and C6-C7. No high-grade canal stenosis. Facet degenerative changes at multiple levels with foraminal narrowing. Upper chest: No acute changes. Left thyroid  nodule measuring up to 1.2 cm, no imaging follow-up is recommended Other: None IMPRESSION: 1. No CT evidence for acute intracranial abnormality. Atrophy and chronic small vessel ischemic changes of the white matter. 2. Stable alignment of the cervical spine. No acute osseous abnormality. 3. Erosive change at the posterior frontal bone with overlying scalp defect. This is grossly stable compared with CT from December, new compared with more remote CT from 2014. Correlate with direct visualization. Electronically Signed   By: Luke Bun M.D.   On: 08/11/2024 19:08   CT Cervical Spine Wo Contrast Result Date: 08/11/2024 CLINICAL DATA:  Syncopal episode EXAM: CT HEAD WITHOUT CONTRAST CT CERVICAL SPINE WITHOUT CONTRAST TECHNIQUE: Multidetector CT imaging of the head and cervical spine was performed following the standard protocol without intravenous contrast. Multiplanar CT image reconstructions of the cervical spine were also generated. RADIATION DOSE REDUCTION: This exam was performed according to the departmental dose-optimization program which includes automated exposure control, adjustment of the mA and/or kV according to patient size and/or use of iterative reconstruction technique. COMPARISON:  CT brain and cervical spine 12/15/2023 FINDINGS: CT HEAD FINDINGS Brain: No acute territorial  infarction, hemorrhage or intracranial mass. Atrophy and chronic small vessel ischemic changes of the white matter. Chronic lacunar infarcts in the basal ganglia. Stable ventricle size Vascular: No hyperdense vessels.  Carotid vascular calcification Skull: No fracture. Large area of erosive change at the posterior frontal bone with overlying scalp defect. This is grossly stable compared with CT from December, new compared with more remote CT from 2014. Sinuses/Orbits: No acute finding. Other: None CT CERVICAL SPINE FINDINGS Alignment: Stable alignment. No abnormal listhesis. Facet alignment is normal Skull base and vertebrae: No acute fracture. No primary bone lesion or focal pathologic process. Soft tissues and spinal canal: No prevertebral fluid or swelling. No visible canal hematoma. Disc levels: Advanced C1-C2 degenerative change. Moderate disc space narrowing C5-C6 and C6-C7. No high-grade canal stenosis. Facet degenerative changes at multiple levels with foraminal narrowing. Upper chest: No acute changes. Left thyroid  nodule measuring up to 1.2 cm, no imaging follow-up is recommended Other: None IMPRESSION: 1. No CT evidence for acute intracranial abnormality. Atrophy and chronic small vessel ischemic changes of the white matter. 2. Stable alignment of the cervical spine. No acute osseous abnormality. 3. Erosive change at the posterior frontal bone with overlying scalp defect. This is grossly stable compared with CT from December, new compared with more remote CT from 2014. Correlate with direct visualization. Electronically Signed   By: Luke Bun M.D.   On: 08/11/2024 19:08   DG Hand Complete Left Result Date: 08/11/2024 CLINICAL DATA:  Pain, syncopal event. EXAM: LEFT HAND - COMPLETE 3+ VIEW COMPARISON:  None Available. FINDINGS: There is no evidence of fracture or dislocation. The bones are subjectively under mineralized. Mild multifocal osteoarthritis. Chondrocalcinosis of the triangular  fibrocartilage. Soft tissues are unremarkable. IMPRESSION: 1. No acute fracture or subluxation of the left hand. 2. Mild multifocal osteoarthritis. Electronically Signed   By: Andrea Gasman M.D.   On: 08/11/2024 18:58   DG Chest 1 View Result Date: 08/11/2024 CLINICAL DATA:  Fall, syncope. EXAM: CHEST  1 VIEW COMPARISON:  12/17/2023 FINDINGS: The cardiomediastinal contours are normal. The lungs are clear. Pulmonary vasculature is normal. No consolidation, pleural effusion, or pneumothorax. The bones are  subjectively under mineralized. No acute osseous abnormalities are seen. IMPRESSION: No active disease. Electronically Signed   By: Andrea Gasman M.D.   On: 08/11/2024 18:57    ECHO scheduled as outpatient on 08/20  TELEMETRY reviewed by me 08/12/2024: sinsu rhythm, rate 90s  EKG reviewed by me: NSR, rate 82 bpm  Data reviewed by me 08/12/2024: last 24h vitals tele labs imaging I/O ED provider note, admission H&P.  Principal Problem:   Syncope and collapse    ASSESSMENT AND PLAN:  AMIT MELOY is a 89 y.o. female  with a past medical history of hyperlipidemia, hypothyroidism, osteoporosis, basal cell carcinoma, history of DVT, GERD who presented to the ED on 08/11/2024 after falling from her chair. Patient states she got up from her chair and felt weak in legs and arms and slide down to floor. Patient denies any LOC/syncope. Patient denies any chest pain, palpitations, lightheadedness/dizziness or SOB.  Cardiology was consulted for further evaluation.   # Generalized weakness, collapse without LOC. # Hyperlipidemia Patient denies any syncope or other cardiac sxs. EKG without acute ischemic changes. Troponins minimally elevated and flat 22 > 58 > 47 > 33 > 35, not ACS.  Lipid panel revealed LDL of 127. Carotid bilateral ultrasound reveals mild calcification of carotid bulbs with Doppler, less than 50% stenosis. BP and HR stable.  -Continue home ASA 81 mg daily.  -Ordered Crestor  10 mg  daily due to elevated LDL.  -Echo scheduled to be done outpatient on 08/20.   Patient is very eager to go home.  Patient ambulated without concern.  Ok for discharge today from a cardiac perspective.  Outpatient echocardiogram scheduled for 08/20.  Follow-up with Dr. Ammon on 08/28.  Will sign off.   This patient's plan of care was discussed and created with Dr. Florencio and he is in agreement.  Signed: Savino Whisenant, PA-C  08/12/2024, 1:20 PM [facility]

## 2024-08-12 NOTE — Assessment & Plan Note (Addendum)
 Outpatient follow up.

## 2024-08-12 NOTE — Assessment & Plan Note (Signed)
 Troponin went up to 58 and came down to 20.

## 2024-08-12 NOTE — Progress Notes (Signed)
 PT Cancellation Note  Patient Details Name: LEANOR VORIS MRN: [id number] DOB: [birth year]   Cancelled Treatment:    Reason Eval/Treat Not Completed: Other (comment). Pt transferring into transport chair to leave ED upon PT arrival, per chart and RN pt has been up ambulating, has a caregiver at home. PT/pt discussed some safety measures to address dizziness with mobility, pt verbalized understanding and planning on use RW instead of SPC at discharge.   Doyal Shams PT, DPT 2:32 PM,08/12/24

## 2024-08-12 NOTE — Assessment & Plan Note (Signed)
 On Wellbutrin  and Cymbalta 

## 2024-08-12 NOTE — Care Management Obs Status (Signed)
 MEDICARE OBSERVATION STATUS NOTIFICATION   Patient Details  Name: BONNE WHACK MRN: [id number] Date of Birth: Oct 09, 1929   Medicare Observation Status Notification Given:  Yes    Rojelio SHAUNNA Rattler 08/12/2024, 11:25 AM

## 2024-08-12 NOTE — ED Notes (Signed)
 Assited pt to the bathroom

## 2024-08-12 NOTE — Assessment & Plan Note (Signed)
 No recurrent symptoms.  Cleared by cardiology to go home.  Echocardiogram was done as outpatient.  Carotid ultrasound showed less than 50% stenosis.  Patient started on aspirin .

## 2024-08-12 NOTE — Assessment & Plan Note (Deleted)
 On Crestor 

## 2024-08-12 NOTE — Discharge Summary (Signed)
 Physician Discharge Summary   Patient: Krystal Richardson MRN: [id number] DOB: 02-25-1929  Admit date:     08/11/2024  Discharge date: 08/12/24  Discharge Physician: Charlie Patterson   PCP: [name], MD   Recommendations at discharge:   Follow-up PCP 5 days Follow-up cardiology Outpatient echocardiogram ordered by cardiology  Discharge Diagnoses: Principal Problem:   Syncope and collapse Active Problems:   Orthostatic hypotension   Myocardial injury   Hypothyroidism   Depression   History of melanoma  Resolved Problems:   * No resolved hospital problems. *  Hospital Course: 89 y.o. female with a known history of basal cell carcinoma, hyperlipidemia, osteoporosis, hyperlipidemia, hypothyroidism, depression, DVT, GERD presents to the emergency department for evaluation of syncope.  Patient was in a usual state of health until this evening when she had a witnessed syncopal episode at home.  Patient was reportedly trying to get out of her chair and lost consciousness for less than 1 minute.  Witnesses said that she slid out of the chair and when she came to about 1 minute later she was mildly confused, complained of dizziness and vomited once.  She does not recall the episode and has returned to her baseline mental status per family.  Patient states that she feels fine now and had a normal day prior to this episode.   Last echo from December 2024 showed LV ejection fraction 50 to 55% with low normal function of the left ventricle consistent with grade 1 diastolic dysfunction.  Of note she was hospitalized at that time for sepsis secondary to urinary tract infection and was placed on hospice however she returned home where she lives independently.   She denied any associated chest pain, shortness of breath, fevers, chills, recent illness, dysuria, urinary frequency, diarrhea.   Otherwise there has been no change in status. Patient has been taking medication as prescribed and there has  been no recent change in medication or diet.  No recent antibiotics.  There has been no recent illness, hospitalizations, travel or sick contacts.     EMS/ED Course: Patient received LR. Medical admission has been requested for further management of syncope, elevated troponin.  8/18.  Cleared by cardiology to go home.  Outpatient echocardiogram ordered.  No further symptoms.  Patient was orthostatic vital signs showed that her blood pressure did decrease initially when standing up but came up after 3 minutes.  Patient was advised to get up slowly and stand for a few minutes before walking.  Patient was given IV fluid hydration here.  Patient was walking with her caregiver to the bathroom.  She wanted to go home prior to physical therapy evaluating her.  Assessment and Plan: * Syncope and collapse No recurrent symptoms.  Cleared by cardiology to go home.  Echocardiogram was done as outpatient.  Carotid ultrasound showed less than 50% stenosis.  Patient started on aspirin .  Orthostatic hypotension Patient was hydrated out.  Patient advised to go from a lying position to a seated position and then stand up before walking stand for few minutes make sure that she is stable.  TED hose prescribed.  Advised to stay hydrated.  Myocardial injury Troponin went up to 58 and came down to 20.  Hypothyroidism On Synthroid   History of melanoma Outpatient follow-up  Depression On Wellbutrin  and Cymbalta          Consultants: Cardiology Procedures performed: None Disposition: Home Diet recommendation:  Regular diet DISCHARGE MEDICATION: Allergies as of 08/12/2024  Reactions   Toprol Xl [metoprolol Tartrate]         Medication List     STOP taking these medications    traMADol  50 MG tablet Commonly known as: ULTRAM        TAKE these medications    aspirin  EC 81 MG tablet Take 1 tablet (81 mg total) by mouth daily. Swallow whole. Start taking on: August 13, 2024    buPROPion  150 MG 24 hr tablet Commonly known as: WELLBUTRIN  XL Take 150 mg by mouth daily.   DULoxetine  30 MG capsule Commonly known as: CYMBALTA  Take 30 mg by mouth daily.   feeding supplement Liqd Take 237 mLs by mouth 2 (two) times daily between meals.   hydrocortisone 2.5 % cream Apply 1 Application topically 2 (two) times daily.   levothyroxine  25 MCG tablet Commonly known as: SYNTHROID  Take 25 mcg by mouth daily before breakfast.   Vitamin D (Ergocalciferol) 1.25 MG (50000 UNIT) Caps capsule Commonly known as: DRISDOL Take 50,000 Units by mouth every 7 (seven) days.        Follow-up Information     [name], MD Follow up in 5 day(s).   Specialty: Internal Medicine Contact information: [address] [place] [address] [phone]                Discharge Exam: [name]   08/11/24 1538 08/11/24 1843  Weight: 45.8 kg 49.9 kg   Physical Exam HENT:     Head: Normocephalic.     Mouth/Throat:     Pharynx: No oropharyngeal exudate.  Eyes:     General: Lids are normal.     Conjunctiva/sclera: Conjunctivae normal.  Cardiovascular:     Rate and Rhythm: Normal rate and regular rhythm.     Heart sounds: Normal heart sounds, S1 normal and S2 normal.  Pulmonary:     Breath sounds: No decreased breath sounds, wheezing, rhonchi or rales.  Abdominal:     Palpations: Abdomen is soft.     Tenderness: There is no abdominal tenderness.  Musculoskeletal:     Right lower leg: No swelling.     Left lower leg: No swelling.  Skin:    General: Skin is warm.  Neurological:     Mental Status: She is alert and oriented to person, place, and time.     Comments: Able to straight leg raise bilaterally.      Condition at discharge: stable  The results of significant diagnostics from this hospitalization (including imaging, microbiology, ancillary and laboratory) are listed below for reference.   Imaging Studies: US  Carotid  Bilateral Result Date: 08/12/2024 CLINICAL DATA:  Syncope and collapse. Hyperlipidemia. Asymptomatic bruit. EXAM: BILATERAL CAROTID DUPLEX ULTRASOUND TECHNIQUE: Elnor scale imaging, color Doppler and duplex ultrasound were performed of bilateral carotid and vertebral arteries in the neck. COMPARISON:  09/10/2013 FINDINGS: Criteria: Quantification of carotid stenosis is based on velocity parameters that correlate the residual internal carotid diameter with NASCET-based stenosis levels, using the diameter of the distal internal carotid lumen as the denominator for stenosis measurement. The following velocity measurements were obtained: RIGHT ICA: 73/25 cm/sec CCA: 87/12 cm/sec SYSTOLIC ICA/CCA RATIO:  0.8 ECA: 78/9 cm/sec LEFT ICA: 78/9 cm/sec CCA: 58/6 cm/sec SYSTOLIC ICA/CCA RATIO:  1.3 ECA: 264 cm/sec RIGHT CAROTID ARTERY: Mild calcified plaque of the carotid bulbs. RIGHT VERTEBRAL ARTERY:  Antegrade flow. LEFT CAROTID ARTERY:  Mild calcified plaque of the carotid bulb. LEFT VERTEBRAL ARTERY:  Antegrade flow. IMPRESSION: Mild calcified atheromatous plaque of the carotid bulbs  with Doppler measurements consistent with less than 50% stenosis. Electronically Signed   By: Aliene Lloyd M.D.   On: 08/12/2024 09:44   X-ray chest PA and lateral Result Date: 08/12/2024 CLINICAL DATA:  Syncope EXAM: CHEST - 2 VIEW COMPARISON:  08/11/2024 FINDINGS: Lungs are hyperexpanded. The lungs are clear without focal pneumonia, edema, pneumothorax or pleural effusion. The cardiopericardial silhouette is within normal limits for size. No acute bony abnormality. Telemetry leads overlie the chest. IMPRESSION: Hyperexpansion without acute cardiopulmonary findings. Electronically Signed   By: Camellia Candle M.D.   On: 08/12/2024 06:25   CT Head Wo Contrast Result Date: 08/11/2024 CLINICAL DATA:  Syncopal episode EXAM: CT HEAD WITHOUT CONTRAST CT CERVICAL SPINE WITHOUT CONTRAST TECHNIQUE: Multidetector CT imaging of the head and cervical  spine was performed following the standard protocol without intravenous contrast. Multiplanar CT image reconstructions of the cervical spine were also generated. RADIATION DOSE REDUCTION: This exam was performed according to the departmental dose-optimization program which includes automated exposure control, adjustment of the mA and/or kV according to patient size and/or use of iterative reconstruction technique. COMPARISON:  CT brain and cervical spine 12/15/2023 FINDINGS: CT HEAD FINDINGS Brain: No acute territorial infarction, hemorrhage or intracranial mass. Atrophy and chronic small vessel ischemic changes of the white matter. Chronic lacunar infarcts in the basal ganglia. Stable ventricle size Vascular: No hyperdense vessels.  Carotid vascular calcification Skull: No fracture. Large area of erosive change at the posterior frontal bone with overlying scalp defect. This is grossly stable compared with CT from December, new compared with more remote CT from 2014. Sinuses/Orbits: No acute finding. Other: None CT CERVICAL SPINE FINDINGS Alignment: Stable alignment. No abnormal listhesis. Facet alignment is normal Skull base and vertebrae: No acute fracture. No primary bone lesion or focal pathologic process. Soft tissues and spinal canal: No prevertebral fluid or swelling. No visible canal hematoma. Disc levels: Advanced C1-C2 degenerative change. Moderate disc space narrowing C5-C6 and C6-C7. No high-grade canal stenosis. Facet degenerative changes at multiple levels with foraminal narrowing. Upper chest: No acute changes. Left thyroid  nodule measuring up to 1.2 cm, no imaging follow-up is recommended Other: None IMPRESSION: 1. No CT evidence for acute intracranial abnormality. Atrophy and chronic small vessel ischemic changes of the white matter. 2. Stable alignment of the cervical spine. No acute osseous abnormality. 3. Erosive change at the posterior frontal bone with overlying scalp defect. This is grossly  stable compared with CT from December, new compared with more remote CT from 2014. Correlate with direct visualization. Electronically Signed   By: Luke Bun M.D.   On: 08/11/2024 19:08   CT Cervical Spine Wo Contrast Result Date: 08/11/2024 CLINICAL DATA:  Syncopal episode EXAM: CT HEAD WITHOUT CONTRAST CT CERVICAL SPINE WITHOUT CONTRAST TECHNIQUE: Multidetector CT imaging of the head and cervical spine was performed following the standard protocol without intravenous contrast. Multiplanar CT image reconstructions of the cervical spine were also generated. RADIATION DOSE REDUCTION: This exam was performed according to the departmental dose-optimization program which includes automated exposure control, adjustment of the mA and/or kV according to patient size and/or use of iterative reconstruction technique. COMPARISON:  CT brain and cervical spine 12/15/2023 FINDINGS: CT HEAD FINDINGS Brain: No acute territorial infarction, hemorrhage or intracranial mass. Atrophy and chronic small vessel ischemic changes of the white matter. Chronic lacunar infarcts in the basal ganglia. Stable ventricle size Vascular: No hyperdense vessels.  Carotid vascular calcification Skull: No fracture. Large area of erosive change at the posterior frontal bone with overlying  scalp defect. This is grossly stable compared with CT from December, new compared with more remote CT from 2014. Sinuses/Orbits: No acute finding. Other: None CT CERVICAL SPINE FINDINGS Alignment: Stable alignment. No abnormal listhesis. Facet alignment is normal Skull base and vertebrae: No acute fracture. No primary bone lesion or focal pathologic process. Soft tissues and spinal canal: No prevertebral fluid or swelling. No visible canal hematoma. Disc levels: Advanced C1-C2 degenerative change. Moderate disc space narrowing C5-C6 and C6-C7. No high-grade canal stenosis. Facet degenerative changes at multiple levels with foraminal narrowing. Upper chest: No  acute changes. Left thyroid  nodule measuring up to 1.2 cm, no imaging follow-up is recommended Other: None IMPRESSION: 1. No CT evidence for acute intracranial abnormality. Atrophy and chronic small vessel ischemic changes of the white matter. 2. Stable alignment of the cervical spine. No acute osseous abnormality. 3. Erosive change at the posterior frontal bone with overlying scalp defect. This is grossly stable compared with CT from December, new compared with more remote CT from 2014. Correlate with direct visualization. Electronically Signed   By: Luke Bun M.D.   On: 08/11/2024 19:08   DG Hand Complete Left Result Date: 08/11/2024 CLINICAL DATA:  Pain, syncopal event. EXAM: LEFT HAND - COMPLETE 3+ VIEW COMPARISON:  None Available. FINDINGS: There is no evidence of fracture or dislocation. The bones are subjectively under mineralized. Mild multifocal osteoarthritis. Chondrocalcinosis of the triangular fibrocartilage. Soft tissues are unremarkable. IMPRESSION: 1. No acute fracture or subluxation of the left hand. 2. Mild multifocal osteoarthritis. Electronically Signed   By: Andrea Gasman M.D.   On: 08/11/2024 18:58   DG Chest 1 View Result Date: 08/11/2024 CLINICAL DATA:  Fall, syncope. EXAM: CHEST  1 VIEW COMPARISON:  12/17/2023 FINDINGS: The cardiomediastinal contours are normal. The lungs are clear. Pulmonary vasculature is normal. No consolidation, pleural effusion, or pneumothorax. The bones are subjectively under mineralized. No acute osseous abnormalities are seen. IMPRESSION: No active disease. Electronically Signed   By: Andrea Gasman M.D.   On: 08/11/2024 18:57      Labs: CBC: Recent Labs  Lab 08/11/24 1541 08/12/24 0607  WBC 5.4 10.0  HGB 13.2 13.0  HCT 38.4 36.6  MCV 89.1 86.5  PLT 199 200   Basic Metabolic Panel: Recent Labs  Lab 08/11/24 1541 08/12/24 0519  NA 136  --   K 3.7  --   CL 100  --   CO2 22  --   GLUCOSE 103*  --   BUN 22  --   CREATININE 0.95  0.79  CALCIUM  9.8  --   MG  --  2.1  PHOS  --  3.6   Liver Function Tests: Recent Labs  Lab 08/11/24 1541  AST 30  ALT 15  ALKPHOS 63  BILITOT 1.0  PROT 8.1  ALBUMIN 4.6   CBG: Recent Labs  Lab 08/11/24 1549  GLUCAP 96    Discharge time spent: greater than 30 minutes.  Signed: Charlie Patterson, MD [facility] 08/12/2024

## 2024-08-12 NOTE — Hospital Course (Addendum)
 89 y.o. female with a known history of basal cell carcinoma, hyperlipidemia, osteoporosis, hyperlipidemia, hypothyroidism, depression, DVT, GERD presents to the emergency department for evaluation of syncope.  Patient was in a usual state of health until this evening when she had a witnessed syncopal episode at home.  Patient was reportedly trying to get out of her chair and lost consciousness for less than 1 minute.  Witnesses said that she slid out of the chair and when she came to about 1 minute later she was mildly confused, complained of dizziness and vomited once.  She does not recall the episode and has returned to her baseline mental status per family.  Patient states that she feels fine now and had a normal day prior to this episode.   Last echo from December 2024 showed LV ejection fraction 50 to 55% with low normal function of the left ventricle consistent with grade 1 diastolic dysfunction.  Of note she was hospitalized at that time for sepsis secondary to urinary tract infection and was placed on hospice however she returned home where she lives independently.   She denied any associated chest pain, shortness of breath, fevers, chills, recent illness, dysuria, urinary frequency, diarrhea.   Otherwise there has been no change in status. Patient has been taking medication as prescribed and there has been no recent change in medication or diet.  No recent antibiotics.  There has been no recent illness, hospitalizations, travel or sick contacts.     EMS/ED Course: Patient received LR. Medical admission has been requested for further management of syncope, elevated troponin.  8/18.  Cleared by cardiology to go home.  Outpatient echocardiogram ordered.  No further symptoms.  Patient was orthostatic vital signs showed that her blood pressure did decrease initially when standing up but came up after 3 minutes.  Patient was advised to get up slowly and stand for a few minutes before walking.  Patient  was given IV fluid hydration here.  Patient was walking with her caregiver to the bathroom.  She wanted to go home prior to physical therapy evaluating her.

## 2024-08-12 NOTE — Progress Notes (Signed)
 Anticoagulation monitoring(Lovenox ):  89 yo female ordered Lovenox  40 mg Q24h    Filed Weights   08/11/24 1538 08/11/24 1843  Weight: 45.8 kg (101 lb) 49.9 kg (110 lb)   BMI 17.8    Lab Results  Component Value Date   CREATININE 0.95 08/11/2024   CREATININE 0.68 12/21/2023   CREATININE 0.69 12/20/2023   Estimated Creatinine Clearance: 29.1 mL/min (by C-G formula based on SCr of 0.95 mg/dL). Hemoglobin & Hematocrit     Component Value Date/Time   HGB 13.2 08/11/2024 1541   HGB 12.0 09/10/2013 0341   HCT 38.4 08/11/2024 1541   HCT 34.9 (L) 09/10/2013 0341     Per Protocol for Patient with estCrcl < 30 ml/min and BMI < 30, will transition to Lovenox  30 mg Q24h.

## 2024-08-13 ENCOUNTER — Observation Stay

## 2024-08-13 ENCOUNTER — Observation Stay
Admit: 2024-08-13 | Discharge: 2024-08-13 | Disposition: A | Discharge status: Home / Self Care | Attending: Internal Medicine | Admitting: Internal Medicine

## 2024-08-13 ENCOUNTER — Observation Stay
Admission: EM | Admit: 2024-08-13 | Discharge: 2024-08-14 | Disposition: A | Discharge status: Home / Self Care | Attending: Internal Medicine | Admitting: Internal Medicine

## 2024-08-13 DIAGNOSIS — E785 Hyperlipidemia, unspecified: Secondary | ICD-10-CM | POA: Insufficient documentation

## 2024-08-13 DIAGNOSIS — K219 Gastro-esophageal reflux disease without esophagitis: Secondary | ICD-10-CM | POA: Insufficient documentation

## 2024-08-13 DIAGNOSIS — R627 Adult failure to thrive: Secondary | ICD-10-CM | POA: Diagnosis not present

## 2024-08-13 DIAGNOSIS — R55 Syncope and collapse: Secondary | ICD-10-CM | POA: Diagnosis present

## 2024-08-13 DIAGNOSIS — E039 Hypothyroidism, unspecified: Secondary | ICD-10-CM | POA: Diagnosis not present

## 2024-08-13 DIAGNOSIS — R531 Weakness: Secondary | ICD-10-CM

## 2024-08-13 DIAGNOSIS — E871 Hypo-osmolality and hyponatremia: Secondary | ICD-10-CM | POA: Diagnosis not present

## 2024-08-13 DIAGNOSIS — F32A Depression, unspecified: Secondary | ICD-10-CM | POA: Diagnosis not present

## 2024-08-13 DIAGNOSIS — Z7982 Long term (current) use of aspirin: Secondary | ICD-10-CM | POA: Insufficient documentation

## 2024-08-13 LAB — COMPREHENSIVE METABOLIC PANEL WITH GFR
ALT: 13 U/L (ref 0–44)
AST: 26 U/L (ref 15–41)
Albumin: 4.1 g/dL (ref 3.5–5.0)
Alkaline Phosphatase: 55 U/L (ref 38–126)
Anion gap: 8 (ref 5–15)
BUN: 24 mg/dL — ABNORMAL HIGH (ref 8–23)
CO2: 23 mmol/L (ref 22–32)
Calcium: 8.9 mg/dL (ref 8.9–10.3)
Chloride: 101 mmol/L (ref 98–111)
Creatinine, Ser: 0.85 mg/dL (ref 0.44–1.00)
GFR, Estimated: >60 mL/min (ref >60)
Glucose, Bld: 113 mg/dL — ABNORMAL HIGH (ref 70–99)
Potassium: 3.9 mmol/L (ref 3.5–5.1)
Sodium: 132 mmol/L — ABNORMAL LOW (ref 135–145)
Total Bilirubin: 1.1 mg/dL (ref 0.0–1.2)
Total Protein: 7 g/dL (ref 6.5–8.1)

## 2024-08-13 LAB — TROPONIN I (HIGH SENSITIVITY)
Troponin I (High Sensitivity): 10 ng/L (ref <18)
Troponin I (High Sensitivity): 10 ng/L (ref <18)

## 2024-08-13 LAB — CBC
HCT: 35.8 % — ABNORMAL LOW (ref 36.0–46.0)
Hemoglobin: 12.3 g/dL (ref 12.0–15.0)
MCH: 30.1 pg (ref 26.0–34.0)
MCHC: 34.4 g/dL (ref 30.0–36.0)
MCV: 87.7 fL (ref 80.0–100.0)
Platelets: 193 K/uL (ref 150–400)
RBC: 4.08 MIL/uL (ref 3.87–5.11)
RDW: 14.3 % (ref 11.5–15.5)
WBC: 6.1 K/uL (ref 4.0–10.5)
nRBC: 0 % (ref 0.0–0.2)

## 2024-08-13 MED ORDER — SODIUM CHLORIDE 0.9% FLUSH
3.0000 mL | Freq: Two times a day (BID) | INTRAVENOUS | Status: DC
  Administered 2024-08-13: 3 mL via INTRAVENOUS

## 2024-08-13 MED ORDER — ONDANSETRON HCL 4 MG/2ML IJ SOLN: 4.0000 mg | Freq: Four times a day (QID) | INTRAMUSCULAR | Status: DC | PRN

## 2024-08-13 MED ORDER — SENNOSIDES-DOCUSATE SODIUM 8.6-50 MG PO TABS
1.0000 | ORAL_TABLET | Freq: Every evening | ORAL | Status: DC | PRN
Start: 2024-08-13 — End: 2024-08-14

## 2024-08-13 MED ORDER — BUPROPION HCL ER (XL) 150 MG PO TB24: 150.0000 mg | ORAL_TABLET | Freq: Every day | ORAL | Status: DC

## 2024-08-13 MED ORDER — ONDANSETRON HCL 4 MG PO TABS
4.0000 mg | ORAL_TABLET | Freq: Four times a day (QID) | ORAL | Status: DC | PRN
Start: 2024-08-13 — End: 2024-08-18

## 2024-08-13 MED ORDER — ASPIRIN 81 MG PO TBEC
81.0000 mg | DELAYED_RELEASE_TABLET | Freq: Every day | ORAL | Status: DC
  Filled 2024-08-13: qty 1

## 2024-08-13 MED ORDER — DULOXETINE HCL 30 MG PO CPEP: 30.0000 mg | ORAL_CAPSULE | Freq: Every day | ORAL | Status: DC

## 2024-08-13 MED ORDER — ACETAMINOPHEN 325 MG PO TABS: 650.0000 mg | ORAL_TABLET | Freq: Four times a day (QID) | ORAL | Status: DC | PRN

## 2024-08-13 MED ORDER — ACETAMINOPHEN 650 MG RE SUPP: 650.0000 mg | Freq: Four times a day (QID) | RECTAL | Status: DC | PRN

## 2024-08-13 MED ORDER — SODIUM CHLORIDE 0.9 % IV SOLN: Freq: Once | INTRAVENOUS | Status: AC

## 2024-08-13 MED ORDER — GADOBUTROL 1 MMOL/ML IV SOLN
5.0000 mL | Freq: Once | INTRAVENOUS | Status: AC | PRN
  Administered 2024-08-13: 5 mL via INTRAVENOUS

## 2024-08-13 MED ORDER — LEVOTHYROXINE SODIUM 25 MCG PO TABS
25.0000 ug | ORAL_TABLET | Freq: Every day | ORAL | Status: DC
  Administered 2024-08-14: 25 ug via ORAL
  Filled 2024-08-13: qty 1

## 2024-08-13 NOTE — Assessment & Plan Note (Signed)
 Fall precaution.

## 2024-08-13 NOTE — Assessment & Plan Note (Signed)
 Home levothyroxine  25 mcg daily before breakfast resume

## 2024-08-13 NOTE — Assessment & Plan Note (Addendum)
 Near syncope Complete echo ordered MRI brain wo contrast ordered If the above workup is negative, would recommend a.m. team to discharge patient with Holter monitor for this arrhythmia evaluation with follow-up with cardiology outpatient Fall precaution Admit to telemetry cardiac, observation

## 2024-08-13 NOTE — H&P (Addendum)
 History and Physical   Krystal Richardson FMW:[id number] DOB: 06-09-1929 DOA: 08/13/2024  PCP: [name], MD Patient coming from: Home via EMS  I have personally briefly reviewed patient's old medical records in [facility] EMR.  Chief Concern: Syncope  HPI: Ms. Krystal Richardson is a 89 year old female with history of myocardial injury, hypothyroid, depression, history of basal cell carcinoma, osteoporosis, hyperlipidemia, depression, DVT, GERD, who presents emergency department for chief concerns of syncope.  Of note, patient was admitted on 08/11/2024 to 08/12/2024 for syncope and collapse.  Vitals in the ED showed T of 98.1, rr 19, heart rate 71, blood pressure 167/70, SpO2 100% on room air.  Serum sodium is 132, potassium 3.9, chloride 101, bicarb 23, BUN of 24, serum creatinine 0.85, EGFR greater than 60, nonfasting blood glucose 113, WBC 6.1, hemoglobin 12.3, platelets of 193.  HS troponin is 10.  ED treatment: None ------------------------------------- At bedside, patient is able to tell me her name, age, current location of hospital, year of 2025.  She felt shortness of breath while sitting. She denies chest pain, and actually passing out. She states she felt like she was going to pass out. She denies nausea, vomiting, fever, chest pain, abdominal pain, dysuria, hematuria, diarrhea.  She endorses nausea with movements of her body. She reports losing about 10 pounds over the last two years. She reports this was not expected.  Social history: She lives by herself and performs her own adls with help from her friend. She denies tobacco, etoh. She is retired and formerly worked at a [employer].   ROS: Constitutional: + weight change, no fever ENT/Mouth: no sore throat, no rhinorrhea Eyes: no eye pain, no vision changes Cardiovascular: no chest pain, no dyspnea,  no edema, no palpitations Respiratory: no cough, no sputum, no wheezing Gastrointestinal: + nausea, no vomiting, no  diarrhea, no constipation Genitourinary: no urinary incontinence, no dysuria, no hematuria Musculoskeletal: no arthralgias, no myalgias Skin: no skin lesions, no pruritus, Neuro: + weakness, no loss of consciousness, no syncope Psych: no anxiety, no depression, + decrease appetite Heme/Lymph: no bruising, no bleeding  ED Course: Discussed with EDP, patient requiring hospitalization for chief concerns of recurrent syncope.  Assessment/Plan  Principal Problem:   Syncope Active Problems:   Hypothyroidism   Hyperlipidemia   Hyponatremia   Failure to thrive in adult   Depression   Generalized weakness   GERD (gastroesophageal reflux disease)   Assessment and Plan:  * Syncope Near syncope Complete echo ordered MRI brain wo contrast ordered If the above workup is negative, would recommend a.m. team to discharge patient with Holter monitor for this arrhythmia evaluation with follow-up with cardiology outpatient Fall precaution Admit to telemetry cardiac, observation  Hypothyroidism Home levothyroxine  25 mcg daily before breakfast resume  Generalized weakness Fall precaution  Depression Duloxetine  30 mg daily, bupropion  150 mg daily resumed  166/64(laying down) to SBP139(sitting) to 160/60.  Chart reviewed.   DVT prophylaxis: Pharmacologic DVT not initiated on admission.  AM team to initiate pharmacologic DVT when the benefits outweigh the risk.  Code Status: DNR/DNI, confirmed with ACP documents Diet: N.p.o. pending nursing swallow screen Family Communication: no  Disposition Plan: Pending clinical course Consults called: pt, ot TOC Admission status: Telemetry cardiac, observation  Past Medical History:  Diagnosis Date   Actinic keratosis 03/12/2019   mid post scalp melanoma graft scar   Basal cell carcinoma 08/04/2014   R forehead - excision 08/19/2014   BCC (basal cell carcinoma of skin) 02/20/2019  L dorsum foot   Hyperlipidemia    Melanoma (HCC) 2002    with chemo and rad tx   Personal history of chemotherapy 2003   MELANOMA   Personal history of radiation therapy 2002   MELANOMA   Stomach ulcer    Urethral caruncle    Past Surgical History:  Procedure Laterality Date   BREAST EXCISIONAL BIOPSY Left 1986   EXCISIONAL - NEG   cataract     hemmriodectomy  1980   melanoma  2002   Social History:  reports that she has never smoked. She has never used smokeless tobacco. She reports that she does not drink alcohol and does not use drugs.  Allergies  Allergen Reactions   Toprol Xl [Metoprolol Tartrate]    Family History  Problem Relation Age of Onset   Heart failure Mother    Kidney disease Neg Hx    Bladder Cancer Neg Hx    Prostate cancer Neg Hx    Breast cancer Neg Hx    Family history: Family history reviewed and not pertinent.  Prior to Admission medications   Medication Sig Start Date End Date Taking? Authorizing Provider  aspirin  EC 81 MG tablet Take 1 tablet (81 mg total) by mouth daily. Swallow whole. 08/13/24  Yes Wieting, Richard, MD  buPROPion  (WELLBUTRIN  XL) 150 MG 24 hr tablet Take 150 mg by mouth daily.   Yes [provider]  DULoxetine  (CYMBALTA ) 30 MG capsule Take 30 mg by mouth daily. 02/02/24 02/01/25 Yes [provider]  hydrocortisone 2.5 % cream Apply 1 Application topically 2 (two) times daily. 07/25/24 07/25/25 Yes [provider]  levothyroxine  (SYNTHROID ) 25 MCG tablet Take 25 mcg by mouth daily before breakfast. 06/05/20  Yes [provider]  Vitamin D, Ergocalciferol, (DRISDOL) 1.25 MG (50000 UNIT) CAPS capsule Take 50,000 Units by mouth every 7 (seven) days. 07/30/24 10/22/24 Yes [provider]  feeding supplement (ENSURE ENLIVE / ENSURE PLUS) LIQD Take 237 mLs by mouth 2 (two) times daily between meals. 07/26/21   [name], MD   Physical Exam: Vitals:   08/13/24 1900 08/13/24 1930 08/13/24 1938 08/13/24 1939  BP: (!) 159/75 (!) 166/64 (S) (!) 139/98 (S) (!)  160/60  Pulse: 73 83 80 80  Resp: 20 (!) 24 17 16   Temp:      TempSrc:      SpO2: 100% 100% 99% 100%  Weight:      Height:       Constitutional: appears frail, age appropriate, NAD, calm Eyes: PERRL, lids and conjunctivae normal ENMT: Mucous membranes are moist. Posterior pharynx clear of any exudate or lesions. Age-appropriate dentition. Hearing appropriate Neck: normal, supple, no masses, no thyromegaly Respiratory: clear to auscultation bilaterally, no wheezing, no crackles. Normal respiratory effort. No accessory muscle use.  Cardiovascular: Regular rate and rhythm, no murmurs / rubs / gallops. No extremity edema. 2+ pedal pulses. No carotid bruits.  Abdomen: no tenderness, no masses palpated, no hepatosplenomegaly. Bowel sounds positive.  Musculoskeletal: no clubbing / cyanosis. No joint deformity upper and lower extremities. Good ROM, no contractures, no atrophy. Normal muscle tone.  Skin: no rashes, lesions, ulcers. No induration Neurologic: Sensation intact. Strength 5/5 in all 4.  Psychiatric: Normal judgment and insight. Alert and oriented x 3. Normal mood.   EKG: independently reviewed, showing sinus rhythm with rate of 68, QTc 451  Chest x-ray on Admission: I personally reviewed and I agree with radiologist reading as below from chest x-ray on 08/12/2024.  US  Carotid  Bilateral Result Date: 08/12/2024 CLINICAL DATA:  Syncope and collapse. Hyperlipidemia. Asymptomatic bruit. EXAM: BILATERAL CAROTID DUPLEX ULTRASOUND TECHNIQUE: Elnor scale imaging, color Doppler and duplex ultrasound were performed of bilateral carotid and vertebral arteries in the neck. COMPARISON:  09/10/2013 FINDINGS: Criteria: Quantification of carotid stenosis is based on velocity parameters that correlate the residual internal carotid diameter with NASCET-based stenosis levels, using the diameter of the distal internal carotid lumen as the denominator for stenosis measurement. The following velocity  measurements were obtained: RIGHT ICA: 73/25 cm/sec CCA: 87/12 cm/sec SYSTOLIC ICA/CCA RATIO:  0.8 ECA: 78/9 cm/sec LEFT ICA: 78/9 cm/sec CCA: 58/6 cm/sec SYSTOLIC ICA/CCA RATIO:  1.3 ECA: 264 cm/sec RIGHT CAROTID ARTERY: Mild calcified plaque of the carotid bulbs. RIGHT VERTEBRAL ARTERY:  Antegrade flow. LEFT CAROTID ARTERY:  Mild calcified plaque of the carotid bulb. LEFT VERTEBRAL ARTERY:  Antegrade flow. IMPRESSION: Mild calcified atheromatous plaque of the carotid bulbs with Doppler measurements consistent with less than 50% stenosis. Electronically Signed   By: Aliene Lloyd M.D.   On: 08/12/2024 09:44   X-ray chest PA and lateral Result Date: 08/12/2024 CLINICAL DATA:  Syncope EXAM: CHEST - 2 VIEW COMPARISON:  08/11/2024 FINDINGS: Lungs are hyperexpanded. The lungs are clear without focal pneumonia, edema, pneumothorax or pleural effusion. The cardiopericardial silhouette is within normal limits for size. No acute bony abnormality. Telemetry leads overlie the chest. IMPRESSION: Hyperexpansion without acute cardiopulmonary findings. Electronically Signed   By: Camellia Candle M.D.   On: 08/12/2024 06:25   Labs on Admission: I have personally reviewed following labs  CBC: Recent Labs  Lab 08/11/24 1541 08/12/24 0607 08/13/24 1635  WBC 5.4 10.0 6.1  HGB 13.2 13.0 12.3  HCT 38.4 36.6 35.8*  MCV 89.1 86.5 87.7  PLT 199 200 193   Basic Metabolic Panel: Recent Labs  Lab 08/11/24 1541 08/12/24 0519 08/13/24 1635  NA 136  --  132*  K 3.7  --  3.9  CL 100  --  101  CO2 22  --  23  GLUCOSE 103*  --  113*  BUN 22  --  24*  CREATININE 0.95 0.79 0.85  CALCIUM  9.8  --  8.9  MG  --  2.1  --   PHOS  --  3.6  --    GFR: Estimated Creatinine Clearance: 32 mL/min (by C-G formula based on SCr of 0.85 mg/dL).  Liver Function Tests: Recent Labs  Lab 08/11/24 1541 08/13/24 1635  AST 30 26  ALT 15 13  ALKPHOS 63 55  BILITOT 1.0 1.1  PROT 8.1 7.0  ALBUMIN 4.6 4.1   CBG: Recent Labs   Lab 08/11/24 1549  GLUCAP 96   Lipid Profile: Recent Labs    08/12/24 0519  CHOL 189  HDL 53  LDLCALC 127*  TRIG 45  CHOLHDL 3.6   Thyroid  Function Tests: Recent Labs    08/12/24 0519  TSH 2.183   Urine analysis:    Component Value Date/Time   COLORURINE STRAW (A) 08/11/2024 1541   APPEARANCEUR CLEAR (A) 08/11/2024 1541   APPEARANCEUR Clear 06/25/2015 1416   LABSPEC 1.008 08/11/2024 1541   LABSPEC 1.010 09/09/2013 1201   PHURINE 7.0 08/11/2024 1541   GLUCOSEU NEGATIVE 08/11/2024 1541   GLUCOSEU Negative 09/09/2013 1201   HGBUR NEGATIVE 08/11/2024 1541   BILIRUBINUR NEGATIVE 08/11/2024 1541   BILIRUBINUR Negative 06/25/2015 1416   BILIRUBINUR Negative 09/09/2013 1201   KETONESUR NEGATIVE 08/11/2024 1541   PROTEINUR NEGATIVE 08/11/2024 1541   NITRITE NEGATIVE 08/11/2024 1541  LEUKOCYTESUR SMALL (A) 08/11/2024 1541   LEUKOCYTESUR 3+ 09/09/2013 1201   This document was prepared using Dragon Voice Recognition software and may include unintentional dictation errors.  Dr. Sherre [facility]  If 7PM-7AM, please contact overnight-coverage provider If 7AM-7PM, please contact day attending provider [email or web address]  08/13/2024, 8:27 PM

## 2024-08-13 NOTE — Hospital Course (Signed)
 Ms. Krystal Richardson is a 89 year old female with history of myocardial injury, hypothyroid, depression, history of basal cell carcinoma, osteoporosis, hyperlipidemia, depression, DVT, GERD, who presents emergency department for chief concerns of syncope.  Of note, patient was admitted on 08/11/2024 to 08/12/2024 for syncope and collapse.  Vitals in the ED showed T of 98.1, rr 19, heart rate 71, blood pressure 167/70, SpO2 100% on room air.  Serum sodium is 132, potassium 3.9, chloride 101, bicarb 23, BUN of 24, serum creatinine 0.85, EGFR greater than 60, nonfasting blood glucose 113, WBC 6.1, hemoglobin 12.3, platelets of 193.HS troponin is 10.  8/20: Vital stable, echocardiogram with normal EF and grade 1 diastolic dysfunction.  No other significant abnormality.  MRI brain was negative for any acute abnormality.  Patient appears at baseline.  Case was discussed with her cardiologist, patient was given a ZIO monitor and they will have a close outpatient follow-up.  Patient has a recent carotid ultrasound which shows less than 50% stenosis.  She was started on aspirin  during most recent hospitalization.  Patient was also prescribed compression stockings during most recent hospitalization to prevent orthostasis.  She will continue with her home medications and need to have a close follow-up with her providers for further assistance.

## 2024-08-13 NOTE — ED Triage Notes (Signed)
 Pt presents to the ED via [facility] from home. Pt had a syncopal episode witnessed by her sister that lasted about 10 seconds. Pt woke up from syncopal episode vomiting. Denies nausea at this time. Pt was here for same on 08/11/24. Pt reports Pt denies dizziness at this time.  HR 70 BP 171/71 CBG 122

## 2024-08-13 NOTE — ED Notes (Signed)
 Called CCMD for central monitoring at this time

## 2024-08-13 NOTE — Assessment & Plan Note (Signed)
 Duloxetine  30 mg daily, bupropion  150 mg daily resumed

## 2024-08-13 NOTE — ED Provider Notes (Signed)
 [facility] Provider Note    Event Date/Time   First MD Initiated Contact with Patient 08/13/24 1639     (approximate)  History   Chief Complaint: Loss of Consciousness  HPI  Krystal Richardson is a 89 y.o. female with a past medical history of hyperlipidemia, presents to the emergency department for syncopal episode.  According to the patient she was at home sitting in her recliner.  She states she has a home health aide that comes and sits with her daily.  She states while sitting in the recliner she began feeling weak and lightheaded, she called for the health aide saying that she felt like she was going to pass out.  Patient then lost consciousness for approximately 10 to 15 seconds she was unresponsive and then began slowly waking up very nauseated and vomited several times.  Patient was just admitted to the hospital 2 days ago discharged yesterday for a similar event with no findings while in the hospital.  Patient continues to state some weakness no longer feels nauseated.  No chest pain at any point.  No recent fever or illness per patient.  Physical Exam   Triage Vital Signs: ED Triage Vitals  Encounter Vitals Group     BP 08/13/24 1631 (!) 167/70     Girls Systolic BP Percentile --      Girls Diastolic BP Percentile --      Boys Systolic BP Percentile --      Boys Diastolic BP Percentile --      Pulse Rate 08/13/24 1631 76     Resp 08/13/24 1631 (!) 23     Temp 08/13/24 1631 98.1 F (36.7 C)     Temp Source 08/13/24 1631 Oral     SpO2 08/13/24 1631 100 %     Weight 08/13/24 1628 108 lb 0.4 oz (49 kg)     Height 08/13/24 1628 5' 6 (1.676 m)     Head Circumference --      Peak Flow --      Pain Score 08/13/24 1628 0     Pain Loc --      Pain Education --      Exclude from Growth Chart --     Most recent vital signs: Vitals:   08/13/24 1631 08/13/24 1635  BP: (!) 167/70   Pulse: 76 71  Resp: (!) 23 19  Temp: 98.1 F (36.7 C)   SpO2: 100%  100%    General: Awake, no distress.  CV:  Good peripheral perfusion.  Regular rate and rhythm  Resp:  Normal effort.  Equal breath sounds bilaterally.  Abd:  No distention.  Soft, nontender.  No rebound or guarding.   ED Results / Procedures / Treatments   EKG  EKG viewed and interpreted by myself shows a sinus rhythm at 68 bpm with a narrow QRS, normal axis, normal intervals, no concerning ST changes.  MEDICATIONS ORDERED IN ED: Medications - No data to display   IMPRESSION / MDM / ASSESSMENT AND PLAN / ED COURSE  I reviewed the triage vital signs and the nursing notes.  Patient's presentation is most consistent with acute presentation with potential threat to life or bodily function.  Patient presents to the emergency department after a syncopal episode while at home in a seated position.  Patient's symptoms are very concerning for possible cardiac cause for the patient's syncopal event such as an arrhythmia.  We will check labs including cardiac enzymes, we will  continue to closely monitor on cardiac telemetry.  We will gently IV hydrate while watching the patient in the emergency department.  However given 2 syncopal episodes in 3 days and this episode seeming concerning for a cardiac cause we will likely admit to the hospital service once the patient's emergency department workup is completed.  Patient's workup today shows a reassuring CBC with a normal white blood cell count, reassuring chemistry with normal renal function.  Troponin is negative.  However given the patient's concerning syncopal episode we will admit to the hospital service for further workup treatment and monitoring.  FINAL CLINICAL IMPRESSION(S) / ED DIAGNOSES   Syncope   Note:  This document was prepared using Dragon voice recognition software and may include unintentional dictation errors.   Dorothyann Drivers, MD 08/13/24 1810

## 2024-08-14 DIAGNOSIS — F32A Depression, unspecified: Secondary | ICD-10-CM

## 2024-08-14 DIAGNOSIS — E871 Hypo-osmolality and hyponatremia: Secondary | ICD-10-CM

## 2024-08-14 DIAGNOSIS — E785 Hyperlipidemia, unspecified: Secondary | ICD-10-CM

## 2024-08-14 DIAGNOSIS — K219 Gastro-esophageal reflux disease without esophagitis: Secondary | ICD-10-CM

## 2024-08-14 DIAGNOSIS — E039 Hypothyroidism, unspecified: Secondary | ICD-10-CM

## 2024-08-14 DIAGNOSIS — R627 Adult failure to thrive: Secondary | ICD-10-CM

## 2024-08-14 DIAGNOSIS — R55 Syncope and collapse: Secondary | ICD-10-CM | POA: Diagnosis not present

## 2024-08-14 LAB — BASIC METABOLIC PANEL WITH GFR
Anion gap: 5 (ref 5–15)
BUN: 17 mg/dL (ref 8–23)
CO2: 24 mmol/L (ref 22–32)
Calcium: 8.8 mg/dL — ABNORMAL LOW (ref 8.9–10.3)
Chloride: 105 mmol/L (ref 98–111)
Creatinine, Ser: 0.68 mg/dL (ref 0.44–1.00)
GFR, Estimated: >60 mL/min (ref >60)
Glucose, Bld: 85 mg/dL (ref 70–99)
Potassium: 3.7 mmol/L (ref 3.5–5.1)
Sodium: 134 mmol/L — ABNORMAL LOW (ref 135–145)

## 2024-08-14 LAB — ECHOCARDIOGRAM COMPLETE
Area-P 1/2: 2.74 cm2
Height: 66 in
S' Lateral: 2.2 cm
Weight: 1728.41 [oz_av]

## 2024-08-14 LAB — CBC
HCT: 35.4 % — ABNORMAL LOW (ref 36.0–46.0)
Hemoglobin: 11.8 g/dL — ABNORMAL LOW (ref 12.0–15.0)
MCH: 29.7 pg (ref 26.0–34.0)
MCHC: 33.3 g/dL (ref 30.0–36.0)
MCV: 89.2 fL (ref 80.0–100.0)
Platelets: 201 K/uL (ref 150–400)
RBC: 3.97 MIL/uL (ref 3.87–5.11)
RDW: 14 % (ref 11.5–15.5)
WBC: 6.4 K/uL (ref 4.0–10.5)
nRBC: 0 % (ref 0.0–0.2)

## 2024-08-14 LAB — CBG MONITORING, ED: Glucose-Capillary: 84 mg/dL (ref 70–99)

## 2024-08-14 NOTE — Discharge Summary (Signed)
 Physician Discharge Summary   Patient: Krystal Richardson MRN: [id number] DOB: [birth year]  Admit date:     08/13/2024  Discharge date: 08/14/24  Discharge Physician: Amaryllis Dare   PCP: [name], MD   Recommendations at discharge:  Please obtain CBC and BMP and follow-up Follow-up with cardiology-patient was given a ZIO monitor Follow-up with primary care provider  Discharge Diagnoses: Principal Problem:   Syncope Active Problems:   Hypothyroidism   Hyperlipidemia   Hyponatremia   Failure to thrive in adult   Depression   Generalized weakness   GERD (gastroesophageal reflux disease)   Hospital Course: Ms. Krystal Richardson is a 89 year old female with history of myocardial injury, hypothyroid, depression, history of basal cell carcinoma, osteoporosis, hyperlipidemia, depression, DVT, GERD, who presents emergency department for chief concerns of syncope.  Of note, patient was admitted on 08/11/2024 to 08/12/2024 for syncope and collapse.  Vitals in the ED showed T of 98.1, rr 19, heart rate 71, blood pressure 167/70, SpO2 100% on room air.  Serum sodium is 132, potassium 3.9, chloride 101, bicarb 23, BUN of 24, serum creatinine 0.85, EGFR greater than 60, nonfasting blood glucose 113, WBC 6.1, hemoglobin 12.3, platelets of 193.HS troponin is 10.  8/20: Vital stable, echocardiogram with normal EF and grade 1 diastolic dysfunction.  No other significant abnormality.  MRI brain was negative for any acute abnormality.  Patient appears at baseline.  Case was discussed with her cardiologist, patient was given a ZIO monitor and they will have a close outpatient follow-up.  Patient has a recent carotid ultrasound which shows less than 50% stenosis.  She was started on aspirin  during most recent hospitalization.  Patient was also prescribed compression stockings during most recent hospitalization to prevent orthostasis.  She will continue with her home medications and need to have a close  follow-up with her providers for further assistance.  Assessment and Plan: * Syncope Near syncope, similar complaint on most recent hospitalization when carotid ultrasound was less than 50% stenosis.  MRI brain was negative for any acute abnormality and echocardiogram with normal EF, grade 1 diastolic dysfunction and no other significant abnormality Patient was given a ZIO monitor and will follow-up with her cardiologist closely as outpatient.  Hypothyroidism Home levothyroxine  25 mcg daily before breakfast resume  Generalized weakness Fall precaution  Depression Duloxetine  30 mg daily, bupropion  150 mg daily resumed  Consultants: Curbside cardiology Procedures performed: None Disposition: Home Diet recommendation:  Discharge Diet Orders (From admission, onward)     Start     Ordered   08/14/24 0000  Diet - low sodium heart healthy        08/14/24 1011           Regular diet DISCHARGE MEDICATION: Allergies as of 08/14/2024       Reactions   Toprol Xl [metoprolol Tartrate]         Medication List     TAKE these medications    aspirin  EC 81 MG tablet Take 1 tablet (81 mg total) by mouth daily. Swallow whole.   buPROPion  150 MG 24 hr tablet Commonly known as: WELLBUTRIN  XL Take 150 mg by mouth daily.   DULoxetine  30 MG capsule Commonly known as: CYMBALTA  Take 30 mg by mouth daily.   feeding supplement Liqd Take 237 mLs by mouth 2 (two) times daily between meals.   hydrocortisone 2.5 % cream Apply 1 Application topically 2 (two) times daily.   levothyroxine  25 MCG tablet Commonly known as: SYNTHROID  Take 25 mcg  by mouth daily before breakfast.   Vitamin D (Ergocalciferol) 1.25 MG (50000 UNIT) Caps capsule Commonly known as: DRISDOL Take 50,000 Units by mouth every 7 (seven) days.        Follow-up Information     Paraschos, Alexander, MD. Go in 1 week(s).   Specialty: Cardiology Why: Appointment scheduled with Dr. Ammon on 08/22/2024 at 3:45  PM Contact information: [address] [facility] [place] [address] [phone]         [name], MD. Schedule an appointment as soon as possible for a visit in 1 week(s).   Specialty: Internal Medicine Contact information: [address] [place] [address] [phone]                Discharge Exam: Krystal Richardson   08/13/24 1628  Weight: 49 kg   General.  Frail elderly lady, in no acute distress. Pulmonary.  Lungs clear bilaterally, normal respiratory effort. CV.  Regular rate and rhythm, no JVD, rub or murmur. Abdomen.  Soft, nontender, nondistended, BS positive. CNS.  Alert and oriented .  No focal neurologic deficit. Extremities.  No edema, no cyanosis, pulses intact and symmetrical.   Condition at discharge: stable  The results of significant diagnostics from this hospitalization (including imaging, microbiology, ancillary and laboratory) are listed below for reference.   Imaging Studies: ECHOCARDIOGRAM COMPLETE Result Date: 08/14/2024    ECHOCARDIOGRAM REPORT   Patient Name:   Krystal Richardson Date of Exam: 08/13/2024 Medical Rec #:  [id number]     Height:       66.0 in Accession #:    [id number]    Weight:       108.0 lb Date of Birth:  [birth year]      BSA:          1.539 m Patient Age:    89 years      BP:           167/70 mmHg Patient Gender: F             HR:           68 bpm. Exam Location:  [facility] Procedure: 2D Echo, Cardiac Doppler and Color Doppler (Both Spectral and Color            Flow Doppler were utilized during procedure). Indications:     R55 Syncope  History:         Patient has prior history of Echocardiogram examinations, most                  recent 12/18/2023. Risk Factors:Dyslipidemia.  Sonographer:     Carl Coma RDCS Referring Phys:  [id number] AMY N COX Diagnosing Phys: Cara JONETTA Lovelace MD IMPRESSIONS  1. Left ventricular ejection fraction, by estimation, is 60 to 65%. The left ventricle has  normal function. The left ventricle has no regional wall motion abnormalities. Left ventricular diastolic parameters are consistent with Grade I diastolic dysfunction (impaired relaxation).  2. Right ventricular systolic function is normal. The right ventricular size is normal.  3. The mitral valve is grossly normal. No evidence of mitral valve regurgitation.  4. The aortic valve is normal in structure. Aortic valve regurgitation is not visualized. FINDINGS  Left Ventricle: Left ventricular ejection fraction, by estimation, is 60 to 65%. The left ventricle has normal function. The left ventricle has no regional wall motion abnormalities. Strain was performed and the global longitudinal strain is indeterminate. The left ventricular internal cavity size was normal in  size. There is borderline concentric left ventricular hypertrophy. Left ventricular diastolic parameters are consistent with Grade I diastolic dysfunction (impaired relaxation). Right Ventricle: The right ventricular size is normal. No increase in right ventricular wall thickness. Right ventricular systolic function is normal. Left Atrium: Left atrial size was normal in size. Right Atrium: Right atrial size was normal in size. Pericardium: There is no evidence of pericardial effusion. Mitral Valve: The mitral valve is grossly normal. There is mild thickening of the mitral valve leaflet(s). There is mild calcification of the mitral valve leaflet(s). Normal mobility of the mitral valve leaflets. Mild mitral annular calcification. No evidence of mitral valve regurgitation. Tricuspid Valve: The tricuspid valve is normal in structure. Tricuspid valve regurgitation is not demonstrated. Aortic Valve: The aortic valve is normal in structure. Aortic valve regurgitation is not visualized. Pulmonic Valve: The pulmonic valve was normal in structure. Pulmonic valve regurgitation is not visualized. Aorta: The ascending aorta was not well visualized. IAS/Shunts: No  atrial level shunt detected by color flow Doppler. Additional Comments: 3D was performed not requiring image post processing on an independent workstation and was indeterminate.  LEFT VENTRICLE PLAX 2D LVIDd:         3.20 cm   Diastology LVIDs:         2.20 cm   LV e' medial:    5.88 cm/s LV PW:         1.10 cm   LV E/e' medial:  13.4 LV IVS:        1.20 cm   LV e' lateral:   6.04 cm/s LVOT diam:     2.00 cm   LV E/e' lateral: 13.0 LV SV:         50 LV SV Index:   33 LVOT Area:     3.14 cm  RIGHT VENTRICLE             IVC RV Basal diam:  3.50 cm     IVC diam: 1.00 cm RV S prime:     14.20 cm/s TAPSE (M-mode): 2.1 cm LEFT ATRIUM             Index        RIGHT ATRIUM          Index LA diam:        3.60 cm 2.34 cm/m   RA Area:     7.71 cm LA Vol (A2C):   21.8 ml 14.16 ml/m  RA Volume:   14.80 ml 9.62 ml/m LA Vol (A4C):   23.8 ml 15.46 ml/m LA Biplane Vol: 22.8 ml 14.81 ml/m  AORTIC VALVE LVOT Vmax:   75.10 cm/s LVOT Vmean:  49.250 cm/s LVOT VTI:    0.160 m  AORTA Ao Root diam: 3.10 cm MITRAL VALVE                TRICUSPID VALVE MV Area (PHT): 2.74 cm     TR Peak grad:   24.6 mmHg MV Decel Time: 277 msec     TR Vmax:        248.00 cm/s MV E velocity: 78.60 cm/s MV A velocity: 143.00 cm/s  SHUNTS MV E/A ratio:  0.55         Systemic VTI:  0.16 m                             Systemic Diam: 2.00 cm Cara JONETTA Lovelace MD Electronically signed by Cara JONETTA Lovelace MD  Signature Date/Time: 08/14/2024/7:49:05 AM    Final    MR BRAIN W WO CONTRAST Result Date: 08/13/2024 CLINICAL DATA:  Mental status change, unknown cause Neuro deficit, acute, stroke suspected multiple episodes of syncope, assess for carcinoma EXAM: MRI HEAD WITHOUT AND WITH CONTRAST TECHNIQUE: Multiplanar, multiecho pulse sequences of the brain and surrounding structures were obtained without and with intravenous contrast. CONTRAST:  5mL GADAVIST  GADOBUTROL  1 MMOL/ML IV SOLN COMPARISON:  CT head May 17 25.  MRI head 09/10/2013. FINDINGS: Brain: No acute  infarction, hemorrhage, hydrocephalus, extra-axial collection or mass lesion. Mild for age patchy white matter T2 hyperintensities, compatible with chronic microvascular ischemic change. No abnormal enhancement. Vascular: Normal flow voids. Skull and upper cervical spine: Normal marrow signal. Sinuses/Orbits: Negative. Other: No mastoid effusions. IMPRESSION: No evidence of acute intracranial abnormality. Electronically Signed   By: Gilmore GORMAN Molt M.D.   On: 08/13/2024 23:40   US  Carotid Bilateral Result Date: 08/12/2024 CLINICAL DATA:  Syncope and collapse. Hyperlipidemia. Asymptomatic bruit. EXAM: BILATERAL CAROTID DUPLEX ULTRASOUND TECHNIQUE: Elnor scale imaging, color Doppler and duplex ultrasound were performed of bilateral carotid and vertebral arteries in the neck. COMPARISON:  09/10/2013 FINDINGS: Criteria: Quantification of carotid stenosis is based on velocity parameters that correlate the residual internal carotid diameter with NASCET-based stenosis levels, using the diameter of the distal internal carotid lumen as the denominator for stenosis measurement. The following velocity measurements were obtained: RIGHT ICA: 73/25 cm/sec CCA: 87/12 cm/sec SYSTOLIC ICA/CCA RATIO:  0.8 ECA: 78/9 cm/sec LEFT ICA: 78/9 cm/sec CCA: 58/6 cm/sec SYSTOLIC ICA/CCA RATIO:  1.3 ECA: 264 cm/sec RIGHT CAROTID ARTERY: Mild calcified plaque of the carotid bulbs. RIGHT VERTEBRAL ARTERY:  Antegrade flow. LEFT CAROTID ARTERY:  Mild calcified plaque of the carotid bulb. LEFT VERTEBRAL ARTERY:  Antegrade flow. IMPRESSION: Mild calcified atheromatous plaque of the carotid bulbs with Doppler measurements consistent with less than 50% stenosis. Electronically Signed   By: Aliene Lloyd M.D.   On: 08/12/2024 09:44   X-ray chest PA and lateral Result Date: 08/12/2024 CLINICAL DATA:  Syncope EXAM: CHEST - 2 VIEW COMPARISON:  08/11/2024 FINDINGS: Lungs are hyperexpanded. The lungs are clear without focal pneumonia, edema,  pneumothorax or pleural effusion. The cardiopericardial silhouette is within normal limits for size. No acute bony abnormality. Telemetry leads overlie the chest. IMPRESSION: Hyperexpansion without acute cardiopulmonary findings. Electronically Signed   By: Camellia Candle M.D.   On: 08/12/2024 06:25   CT Head Wo Contrast Result Date: 08/11/2024 CLINICAL DATA:  Syncopal episode EXAM: CT HEAD WITHOUT CONTRAST CT CERVICAL SPINE WITHOUT CONTRAST TECHNIQUE: Multidetector CT imaging of the head and cervical spine was performed following the standard protocol without intravenous contrast. Multiplanar CT image reconstructions of the cervical spine were also generated. RADIATION DOSE REDUCTION: This exam was performed according to the departmental dose-optimization program which includes automated exposure control, adjustment of the mA and/or kV according to patient size and/or use of iterative reconstruction technique. COMPARISON:  CT brain and cervical spine 12/15/2023 FINDINGS: CT HEAD FINDINGS Brain: No acute territorial infarction, hemorrhage or intracranial mass. Atrophy and chronic small vessel ischemic changes of the white matter. Chronic lacunar infarcts in the basal ganglia. Stable ventricle size Vascular: No hyperdense vessels.  Carotid vascular calcification Skull: No fracture. Large area of erosive change at the posterior frontal bone with overlying scalp defect. This is grossly stable compared with CT from December, new compared with more remote CT from 2014. Sinuses/Orbits: No acute finding. Other: None CT CERVICAL SPINE FINDINGS Alignment: Stable alignment. No abnormal  listhesis. Facet alignment is normal Skull base and vertebrae: No acute fracture. No primary bone lesion or focal pathologic process. Soft tissues and spinal canal: No prevertebral fluid or swelling. No visible canal hematoma. Disc levels: Advanced C1-C2 degenerative change. Moderate disc space narrowing C5-C6 and C6-C7. No high-grade canal  stenosis. Facet degenerative changes at multiple levels with foraminal narrowing. Upper chest: No acute changes. Left thyroid  nodule measuring up to 1.2 cm, no imaging follow-up is recommended Other: None IMPRESSION: 1. No CT evidence for acute intracranial abnormality. Atrophy and chronic small vessel ischemic changes of the white matter. 2. Stable alignment of the cervical spine. No acute osseous abnormality. 3. Erosive change at the posterior frontal bone with overlying scalp defect. This is grossly stable compared with CT from December, new compared with more remote CT from 2014. Correlate with direct visualization. Electronically Signed   By: Luke Bun M.D.   On: 08/11/2024 19:08   CT Cervical Spine Wo Contrast Result Date: 08/11/2024 CLINICAL DATA:  Syncopal episode EXAM: CT HEAD WITHOUT CONTRAST CT CERVICAL SPINE WITHOUT CONTRAST TECHNIQUE: Multidetector CT imaging of the head and cervical spine was performed following the standard protocol without intravenous contrast. Multiplanar CT image reconstructions of the cervical spine were also generated. RADIATION DOSE REDUCTION: This exam was performed according to the departmental dose-optimization program which includes automated exposure control, adjustment of the mA and/or kV according to patient size and/or use of iterative reconstruction technique. COMPARISON:  CT brain and cervical spine 12/15/2023 FINDINGS: CT HEAD FINDINGS Brain: No acute territorial infarction, hemorrhage or intracranial mass. Atrophy and chronic small vessel ischemic changes of the white matter. Chronic lacunar infarcts in the basal ganglia. Stable ventricle size Vascular: No hyperdense vessels.  Carotid vascular calcification Skull: No fracture. Large area of erosive change at the posterior frontal bone with overlying scalp defect. This is grossly stable compared with CT from December, new compared with more remote CT from 2014. Sinuses/Orbits: No acute finding. Other: None CT  CERVICAL SPINE FINDINGS Alignment: Stable alignment. No abnormal listhesis. Facet alignment is normal Skull base and vertebrae: No acute fracture. No primary bone lesion or focal pathologic process. Soft tissues and spinal canal: No prevertebral fluid or swelling. No visible canal hematoma. Disc levels: Advanced C1-C2 degenerative change. Moderate disc space narrowing C5-C6 and C6-C7. No high-grade canal stenosis. Facet degenerative changes at multiple levels with foraminal narrowing. Upper chest: No acute changes. Left thyroid  nodule measuring up to 1.2 cm, no imaging follow-up is recommended Other: None IMPRESSION: 1. No CT evidence for acute intracranial abnormality. Atrophy and chronic small vessel ischemic changes of the white matter. 2. Stable alignment of the cervical spine. No acute osseous abnormality. 3. Erosive change at the posterior frontal bone with overlying scalp defect. This is grossly stable compared with CT from December, new compared with more remote CT from 2014. Correlate with direct visualization. Electronically Signed   By: Luke Bun M.D.   On: 08/11/2024 19:08   DG Hand Complete Left Result Date: 08/11/2024 CLINICAL DATA:  Pain, syncopal event. EXAM: LEFT HAND - COMPLETE 3+ VIEW COMPARISON:  None Available. FINDINGS: There is no evidence of fracture or dislocation. The bones are subjectively under mineralized. Mild multifocal osteoarthritis. Chondrocalcinosis of the triangular fibrocartilage. Soft tissues are unremarkable. IMPRESSION: 1. No acute fracture or subluxation of the left hand. 2. Mild multifocal osteoarthritis. Electronically Signed   By: Andrea Gasman M.D.   On: 08/11/2024 18:58   DG Chest 1 View Result Date: 08/11/2024 CLINICAL DATA:  Fall, syncope. EXAM: CHEST  1 VIEW COMPARISON:  12/17/2023 FINDINGS: The cardiomediastinal contours are normal. The lungs are clear. Pulmonary vasculature is normal. No consolidation, pleural effusion, or pneumothorax. The bones are  subjectively under mineralized. No acute osseous abnormalities are seen. IMPRESSION: No active disease. Electronically Signed   By: Andrea Gasman M.D.   On: 08/11/2024 18:57    Microbiology: Results for orders placed or performed during the hospital encounter of 12/15/23  Blood culture (routine x 2)     Status: None   Collection Time: 12/15/23 12:48 PM   Specimen: BLOOD  Result Value Ref Range Status   Specimen Description   Final    BLOOD LEFT ANTECUBITAL Performed at [facility], [address] [place], [place] [address]    Special Requests   Final    BOTTLES DRAWN AEROBIC AND ANAEROBIC Blood Culture adequate volume Performed at [facility], [address] [place], [place] [address]    Culture   Final    NO GROWTH 5 DAYS Performed at [facility], [address], [place], [place] [address]    Report Status 12/20/2023 FINAL  Final  Blood culture (routine x 2)     Status: None   Collection Time: 12/15/23 12:48 PM   Specimen: BLOOD  Result Value Ref Range Status   Specimen Description   Final    BLOOD BLOOD RIGHT FOREARM Performed at [facility], [address], [place], [place] [address]    Special Requests   Final    BOTTLES DRAWN AEROBIC AND ANAEROBIC Blood Culture adequate volume Performed at [facility], [address] [place], [place] [address]    Culture   Final    NO GROWTH 5 DAYS Performed at [facility], [address], [place], [place] [address]    Report Status 12/20/2023 FINAL  Final  Urine Culture     Status: Abnormal   Collection Time: 12/15/23 12:59 PM   Specimen: Urine, Clean Catch  Result Value Ref Range Status   Specimen Description   Final    URINE, CLEAN CATCH Performed at [facility], [address], [place], [place] [address]    Special Requests   Final    NONE Performed at [facility], [address] [place], [place] [address]    Culture (A)  Final    20,000  COLONIES/mL STREPTOCOCCUS MITIS/ORALIS 7,000 COLONIES/mL ESCHERICHIA COLI CALL MICROBIOLOGY LAB IF SENSITIVITIES ARE REQUIRED. STREPTOCOCCUS MITIS/ORALIS    Report Status 12/17/2023 FINAL  Final   Organism ID, Bacteria ESCHERICHIA COLI (A)  Final      Susceptibility   Escherichia coli - MIC*    AMPICILLIN <=2 SENSITIVE Sensitive     CEFAZOLIN <=4 SENSITIVE Sensitive     CEFEPIME  <=0.12 SENSITIVE Sensitive     CEFTRIAXONE  <=0.25 SENSITIVE Sensitive     CIPROFLOXACIN <=0.25 SENSITIVE Sensitive     GENTAMICIN <=1 SENSITIVE Sensitive     IMIPENEM <=0.25 SENSITIVE Sensitive     NITROFURANTOIN <=16 SENSITIVE Sensitive     TRIMETH/SULFA <=20 SENSITIVE Sensitive     AMPICILLIN/SULBACTAM <=2 SENSITIVE Sensitive     PIP/TAZO <=4 SENSITIVE Sensitive ug/mL    * 7,000 COLONIES/mL ESCHERICHIA COLI    Labs: CBC: Recent Labs  Lab 08/11/24 1541 08/12/24 0607 08/13/24 1635 08/14/24 0455  WBC 5.4 10.0 6.1 6.4  HGB 13.2 13.0 12.3 11.8*  HCT 38.4 36.6 35.8* 35.4*  MCV 89.1 86.5 87.7 89.2  PLT 199 200 193  201   Basic Metabolic Panel: Recent Labs  Lab 08/11/24 1541 08/12/24 0519 08/13/24 1635 08/14/24 0455  NA 136  --  132* 134*  K 3.7  --  3.9 3.7  CL 100  --  101 105  CO2 22  --  23 24  GLUCOSE 103*  --  113* 85  BUN 22  --  24* 17  CREATININE 0.95 0.79 0.85 0.68  CALCIUM  9.8  --  8.9 8.8*  MG  --  2.1  --   --   PHOS  --  3.6  --   --    Liver Function Tests: Recent Labs  Lab 08/11/24 1541 08/13/24 1635  AST 30 26  ALT 15 13  ALKPHOS 63 55  BILITOT 1.0 1.1  PROT 8.1 7.0  ALBUMIN 4.6 4.1   CBG: Recent Labs  Lab 08/11/24 1549 08/14/24 0500  GLUCAP 96 84    Discharge time spent: greater than 30 minutes.  This record has been created using Conservation officer, historic buildings. Errors have been sought and corrected,but may not always be located. Such creation errors do not reflect on the standard of care.   Signed: Amaryllis Dare, MD [facility] 08/14/2024

## 2024-08-14 NOTE — Care Management Obs Status (Signed)
 MEDICARE OBSERVATION STATUS NOTIFICATION   Patient Details  Name: Krystal Richardson MRN: [id number] Date of Birth: [birth year]   Medicare Observation Status Notification Given:  Yes    Katreena Schupp W, CMA 08/14/2024, 10:57 AM

## 2024-08-14 NOTE — Evaluation (Signed)
 Occupational Therapy Evaluation Patient Details Name: Krystal Richardson MRN: [id number] DOB: August 11, 1929 Today's Date: 08/14/2024   History of Present Illness   Krystal Richardson is a 89 year old female with history of myocardial injury, hypothyroid, depression, history of basal cell carcinoma, osteoporosis, hyperlipidemia, depression, DVT, GERD, who presents emergency department for chief concerns of syncope. Patient recently here and discharged for same.     Clinical Impressions Krystal Richardson was seen for OT/PT co-evaluation this date. Pt presents at or near functional baseline of independence. Prior to hospital admission, pt was supervision - MOD I for ADL management and required assistance from her PCA/son for IADLs. Pt lives alone in a 1 story home with 2 STE. Pt demonstrates baseline independence to perform ADL and mobility tasks at supervision level which family/PCA are providing in the home. No skilled OT needs identified. Will sign off. Please re-consult if additional OT needs arise.      If plan is discharge home, recommend the following:   A little help with walking and/or transfers;A little help with bathing/dressing/bathroom;Help with stairs or ramp for entrance;Assist for transportation;Assistance with cooking/housework     Functional Status Assessment   Patient has not had a recent decline in their functional status     Equipment Recommendations   None recommended by OT     Recommendations for Other Services         Precautions/Restrictions   Precautions Precautions: Fall Recall of Precautions/Restrictions: Intact Restrictions Weight Bearing Restrictions Per Provider Order: No     Mobility Bed Mobility Overal bed mobility: Modified Independent                  Transfers Overall transfer level: Needs assistance Equipment used: 1 person hand held assist Transfers: Sit to/from Stand Sit to Stand: Supervision, Contact guard assist            General transfer comment: Intial CGA progressing to supervision with +2 assist for management of lines/leads.      Balance Overall balance assessment: Needs assistance, Mild deficits observed, not formally tested, History of Falls Sitting-balance support: Feet supported Sitting balance-Leahy Scale: Normal     Standing balance support: Single extremity supported, During functional activity Standing balance-Leahy Scale: Good Standing balance comment: light hand held A with standing and walking, would benefit from RW.                           ADL either performed or assessed with clinical judgement   ADL Overall ADL's : Needs assistance/impaired                                       General ADL Comments: Supervision for bed/functional mobility, toilet transfer, toileting, and standing HH at sink.     Vision Baseline Vision/History: 1 Wears glasses Ability to See in Adequate Light: 1 Impaired Patient Visual Report: No change from baseline       Perception         Praxis         Pertinent Vitals/Pain Pain Assessment Pain Assessment: No/denies pain     Extremity/Trunk Assessment Upper Extremity Assessment Upper Extremity Assessment: Generalized weakness   Lower Extremity Assessment Lower Extremity Assessment: Generalized weakness   Cervical / Trunk Assessment Cervical / Trunk Assessment: Normal   Communication Communication Communication: Impaired Factors Affecting Communication: Hearing impaired   Cognition Arousal: Alert Behavior  During Therapy: WFL for tasks assessed/performed                                 Following commands: Intact       Cueing  General Comments   Cueing Techniques: Verbal cues      Exercises Other Exercises Other Exercises: Pt/caregiver educated on role of OT in acute setting, safety, falls prevention and DC recs.   Shoulder Instructions      Home Living Family/patient expects to  be discharged to:: Private residence Living Arrangements: Non-relatives/Friends;Other relatives Available Help at Discharge: Family;Personal care attendant;Available 24 hours/day (Has PCA 8 hours a day during the week and son lives next door) Type of Home: House Home Access: Stairs to enter [employer] of Steps: 2 Entrance Stairs-Rails: Left Home Layout: One level     Bathroom Shower/Tub: Walk-in shower         Home Equipment: Pharmacist, hospital (2 wheels);Grab bars - tub/shower;Hand held shower head;Cane - single point          Prior Functioning/Environment Prior Level of Function : Independent/Modified Independent             Mobility Comments: Normally uses SPC in the home, has walker as needed. ADLs Comments: Typically (I) with BADLs. Assist for IADLs.    OT Problem List: Decreased coordination;Decreased activity tolerance;Decreased safety awareness;Impaired balance (sitting and/or standing);Decreased knowledge of use of DME or AE   OT Treatment/Interventions:        OT Goals(Current goals can be found in the care plan section)   Acute Rehab OT Goals Patient Stated Goal: To go home OT Goal Formulation: All assessment and education complete, DC therapy Time For Goal Achievement: 08/14/24 Potential to Achieve Goals: Good   OT Frequency:       Co-evaluation PT/OT/SLP Co-Evaluation/Treatment: Yes Reason for Co-Treatment: Necessary to address cognition/behavior during functional activity;For patient/therapist safety;To address functional/ADL transfers PT goals addressed during session: Mobility/safety with mobility;Balance OT goals addressed during session: ADL's and self-care;Proper use of Adaptive equipment and DME      AM-PAC OT 6 Clicks Daily Activity     Outcome Measure Help from another person eating meals?: None Help from another person taking care of personal grooming?: A Little Help from another person toileting, which includes  using toliet, bedpan, or urinal?: A Little Help from another person bathing (including washing, rinsing, drying)?: A Little Help from another person to put on and taking off regular upper body clothing?: A Little Help from another person to put on and taking off regular lower body clothing?: A Little 6 Click Score: 19   End of Session Nurse Communication: Mobility status  Activity Tolerance: Patient tolerated treatment well Patient left: in bed;with call bell/phone within reach;with bed alarm set;with family/visitor present  OT Visit Diagnosis: Other abnormalities of gait and mobility (R26.89)                Time: 9094-9081 OT Time Calculation (min): 13 min Charges:  OT General Charges $OT Visit: 1 Visit OT Evaluation $OT Eval Low Complexity: 1 Low  Jhonny Pelton, M.S., OTR/L 08/14/24, 11:35 AM

## 2024-08-14 NOTE — Evaluation (Signed)
 Physical Therapy Evaluation Patient Details Name: Krystal Richardson MRN: [id number] DOB: 04-Mar-1929 Today's Date: 08/14/2024  History of Present Illness  Ms. Mavi Un is a 89 year old female with history of myocardial injury, hypothyroid, depression, history of basal cell carcinoma, osteoporosis, hyperlipidemia, depression, DVT, GERD, who presents emergency department for chief concerns of syncope. Patient recently here and discharged for same.   Clinical Impression  Patient received in bed, with caregiver at bedside. Patient is agreeable to assessment. She is mod I with bed mobility, cga for sit to stand and cga (light hand held A) to ambulate ~50 feet to bathroom. She is able to perform self care with supervision/cga. Patient appears to be close to baseline and has caregiver assist during the day. She does not require skilled PT at this time or follow up at this time. Signing off.          If plan is discharge home, recommend the following: A little help with walking and/or transfers;A little help with bathing/dressing/bathroom;Help with stairs or ramp for entrance;Assist for transportation;Assistance with cooking/housework   Can travel by private vehicle    yes    Equipment Recommendations None recommended by PT  Recommendations for Other Services       Functional Status Assessment Patient has not had a recent decline in their functional status     Precautions / Restrictions Precautions Precautions: Fall Recall of Precautions/Restrictions: Intact Restrictions Weight Bearing Restrictions Per Provider Order: No      Mobility  Bed Mobility Overal bed mobility: Modified Independent                  Transfers Overall transfer level: Needs assistance Equipment used: 1 person hand held assist Transfers: Sit to/from Stand                  Ambulation/Gait Ambulation/Gait assistance: Contact guard assist Gait Distance (Feet): 50 Feet Assistive device: 1 person  hand held assist Gait Pattern/deviations: Step-through pattern Gait velocity: decr     General Gait Details: patient ambulating well with light hand held assist. Has caregivers and mobility equipment available at home  Stairs            Wheelchair Mobility     Tilt Bed    Modified Rankin (Stroke Patients Only)       Balance Overall balance assessment: Needs assistance, Mild deficits observed, not formally tested, History of Falls Sitting-balance support: Feet supported Sitting balance-Leahy Scale: Normal     Standing balance support: Single extremity supported Standing balance-Leahy Scale: Good Standing balance comment: light hand held A with standing and walking                             Pertinent Vitals/Pain Pain Assessment Pain Assessment: No/denies pain    Home Living Family/patient expects to be discharged to:: Private residence Living Arrangements: Non-relatives/Friends;Other relatives Available Help at Discharge: Family;Personal care attendant;Available 24 hours/day (Has PCA 8 hours a day during the week and son lives next door) Type of Home: House Home Access: Stairs to enter Entrance Stairs-Rails: Left Entrance Stairs-Number of Steps: 2   Home Layout: One level Home Equipment: Pharmacist, hospital (2 wheels);Grab bars - tub/shower;Hand held shower head;Cane - single point      Prior Function Prior Level of Function : Independent/Modified Independent             Mobility Comments: Normally uses SPC in the home, has walker as needed. ADLs  Comments: Typically (I) with BADLs. Assist for IADLs.     Extremity/Trunk Assessment   Upper Extremity Assessment Upper Extremity Assessment: Defer to OT evaluation    Lower Extremity Assessment Lower Extremity Assessment: Generalized weakness    Cervical / Trunk Assessment Cervical / Trunk Assessment: Normal  Communication   Communication Communication: Impaired Factors  Affecting Communication: Hearing impaired    Cognition Arousal: Alert Behavior During Therapy: WFL for tasks assessed/performed   PT - Cognitive impairments: No apparent impairments                         Following commands: Intact       Cueing Cueing Techniques: Verbal cues     General Comments      Exercises     Assessment/Plan    PT Assessment Patient does not need any further PT services  PT Problem List         PT Treatment Interventions      PT Goals (Current goals can be found in the Care Plan section)  Acute Rehab PT Goals Patient Stated Goal: improve feeling of passing out PT Goal Formulation: With patient Time For Goal Achievement: 09/15/24 Potential to Achieve Goals: Good    Frequency       Co-evaluation PT/OT/SLP Co-Evaluation/Treatment: Yes Reason for Co-Treatment: Necessary to address cognition/behavior during functional activity;For patient/therapist safety;To address functional/ADL transfers PT goals addressed during session: Mobility/safety with mobility;Balance         AM-PAC PT 6 Clicks Mobility  Outcome Measure Help needed turning from your back to your side while in a flat bed without using bedrails?: None Help needed moving from lying on your back to sitting on the side of a flat bed without using bedrails?: None Help needed moving to and from a bed to a chair (including a wheelchair)?: A Little Help needed standing up from a chair using your arms (e.g., wheelchair or bedside chair)?: A Little Help needed to walk in hospital room?: A Little Help needed climbing 3-5 steps with a railing? : A Little 6 Click Score: 20    End of Session   Activity Tolerance: Patient tolerated treatment well Patient left: in bed;with call bell/phone within reach;with family/visitor present Nurse Communication: Mobility status PT Visit Diagnosis: Muscle weakness (generalized) (M62.81);Other abnormalities of gait and mobility (R26.89)     Time: 9094-9081 PT Time Calculation (min) (ACUTE ONLY): 13 min   Charges:   PT Evaluation $PT Eval Low Complexity: 1 Low   PT General Charges $$ ACUTE PT VISIT: 1 Visit         Emanuell Morina, PT, GCS 08/14/24,10:25 AM

## 2024-10-18 ENCOUNTER — Other Ambulatory Visit: Payer: Self-pay

## 2024-10-18 DIAGNOSIS — S22079A Unspecified fracture of T9-T10 vertebra, initial encounter for closed fracture: Secondary | ICD-10-CM

## 2024-10-24 ENCOUNTER — Ambulatory Visit: Admission: RE | Admit: 2024-10-24

## 2024-10-28 ENCOUNTER — Ambulatory Visit: Admission: RE | Admit: 2024-10-28 | Source: Ambulatory Visit

## 2024-11-14 ENCOUNTER — Ambulatory Visit (INDEPENDENT_AMBULATORY_CARE_PROVIDER_SITE_OTHER)

## 2024-11-14 DIAGNOSIS — D489 Neoplasm of uncertain behavior, unspecified: Secondary | ICD-10-CM | POA: Diagnosis not present

## 2024-11-14 DIAGNOSIS — L308 Other specified dermatitis: Secondary | ICD-10-CM | POA: Diagnosis not present

## 2024-11-14 DIAGNOSIS — L570 Actinic keratosis: Secondary | ICD-10-CM

## 2024-11-14 DIAGNOSIS — C4440 Unspecified malignant neoplasm of skin of scalp and neck: Secondary | ICD-10-CM

## 2024-11-14 NOTE — Progress Notes (Signed)
 Subjective   Krystal Richardson is a 89 y.o. female who presents for the following: Lesion(s) of concern . Patient is established patient   Patient has had melanoma  Today patient reports: Patient's melanoma site has puss, redness, and itchy.  Been occurring for 3 months. Patient has had residue from site the past 2 weeks. In July of 2024 with Dr. Jackquline had biopsy of site and results showed: ULCER WITH INFLAMED GRANULATION TISSUE WITH HEMORRHAGIC SCALE CRUST, SEE DESCRIPTION   Started on abx and recommended follow up but has not been seen since 06/2023.   Review of Systems:    History of melanoma ROS - denies any fevers, chills, weight loss, night sweats, lymphadenopathy, nausea, vomiting, diarrhea, chest pain, shortness of breath, headaches, changes in vision  - patient does endorse recent fatigue .  The following portions of the chart were reviewed this encounter and updated as appropriate: medications, allergies, medical history  Relevant Medical History:  Personal history of melanoma - see medical history for full details   - hx of melanoma in 2002 s/p RT, chemo   Objective  (SKPE) Well appearing patient in no apparent distress; mood and affect are within normal limits. Examination was performed of the: Focused Exam of: Scalp   Examination notable for:  4x3cm pink eroded plaque w/central serous and scaly crust   Central Frontal Scalp 12:00 4x3cm pink eroded plaque w/central serous and scaly crust   Central Frontal Scalp 9:00 4x3cm pink eroded plaque w/central serous and scaly crust   Assessment & Plan  (SKAP)   4x3cm eroded plaque of central frontal scalp/mid crown  - Has hx of melanoma in 2002 of scalp s/p RT, chemo, surgery. Lesion has been present for several months. Initially seen for this 06/2023 w/ bx showing ulcer, lost to follow up. - Today discussed concern for melanoma recurrence vs scc vs bcc w/ potential erosion into bone with both pt and caregiver  -  Scouting biopsy taken x 2 from lesion - will obtain CT head/neck w/ and w/o contrast  - discussed pending results - will likely need eval my med/surg onc      Procedures, orders, diagnosis for this visit:  NEOPLASM OF UNCERTAIN BEHAVIOR (2) Central Frontal Scalp 12:00 Skin / nail biopsy Type of biopsy: tangential   Informed consent: discussed and consent obtained   Timeout: patient name, date of birth, surgical site, and procedure verified   Procedure prep:  Patient was prepped and draped in usual sterile fashion Prep type:  Isopropyl alcohol Anesthesia: the lesion was anesthetized in a standard fashion   Anesthetic:  1% lidocaine w/ epinephrine 1-100,000 buffered w/ 8.4% NaHCO3 Instrument used: DermaBlade   Hemostasis achieved with: pressure and aluminum chloride   Outcome: patient tolerated procedure well   Post-procedure details: sterile dressing applied and wound care instructions given   Dressing type: bandage and petrolatum    Specimen 1 - Surgical pathology Differential Diagnosis: BCC vs SCC vs Recurrent Melanoma   Check Margins: No Central Frontal Scalp 9:00 Skin / nail biopsy Type of biopsy: tangential   Informed consent: discussed and consent obtained   Timeout: patient name, date of birth, surgical site, and procedure verified   Procedure prep:  Patient was prepped and draped in usual sterile fashion Prep type:  Isopropyl alcohol Anesthesia: the lesion was anesthetized in a standard fashion   Anesthetic:  1% lidocaine w/ epinephrine 1-100,000 buffered w/ 8.4% NaHCO3 Instrument used: DermaBlade   Hemostasis achieved with: pressure and  aluminum chloride   Outcome: patient tolerated procedure well   Post-procedure details: sterile dressing applied and wound care instructions given   Dressing type: bandage and petrolatum    Specimen 2 - Surgical pathology Differential Diagnosis: BCC vs SCC vs Recurrent Melanoma   Check Margins: No Related Procedures CT SOFT  TISSUE NECK W WO CONTRAST  Neoplasm of uncertain behavior -     Skin / nail biopsy -     Skin / nail biopsy -     Surgical pathology; Standing -     CT SOFT TISSUE NECK W WO CONTRAST; Future    Return to clinic: No follow-ups on file.  I, Emerick Ege, CMA am acting as scribe for Lauraine JAYSON Kanaris, MD.  Documentation: I have reviewed the above documentation for accuracy and completeness, and I agree with the above.  Lauraine JAYSON Kanaris, MD

## 2024-11-19 ENCOUNTER — Ambulatory Visit: Payer: Self-pay

## 2024-11-19 LAB — SURGICAL PATHOLOGY

## 2024-11-20 NOTE — Progress Notes (Signed)
 Appointment scheduled and phone number provided for [facility] Scheduling.

## 2024-11-26 ENCOUNTER — Ambulatory Visit

## 2024-11-26 DIAGNOSIS — D485 Neoplasm of uncertain behavior of skin: Secondary | ICD-10-CM

## 2024-11-26 DIAGNOSIS — L570 Actinic keratosis: Secondary | ICD-10-CM

## 2024-11-26 DIAGNOSIS — L309 Dermatitis, unspecified: Secondary | ICD-10-CM | POA: Diagnosis not present

## 2024-11-26 DIAGNOSIS — L905 Scar conditions and fibrosis of skin: Secondary | ICD-10-CM

## 2024-11-26 MED ORDER — TRIAMCINOLONE ACETONIDE 0.1 % EX CREA: TOPICAL_CREAM | CUTANEOUS | 0 refills | Status: AC

## 2024-11-26 NOTE — Patient Instructions (Addendum)

## 2024-11-26 NOTE — Progress Notes (Signed)
 Subjective   Krystal Richardson is a 89 y.o. female who presents for the following: Follow up of bx site. Patient is established patient   Today patient reports: Large crusted lesion, bx proven AK, pt is here today for deeper biopsy to r/o skin cancer. Pt is scheduled for CT later this week on 11/28/24.  Review of Systems:    No other skin or systemic complaints except as noted in HPI or Assessment and Plan.  The following portions of the chart were reviewed this encounter and updated as appropriate: medications, allergies, medical history  Relevant Medical History:  Personal history of melanoma - see medical history for full details   Objective  (SKPE) Well appearing patient in no apparent distress; mood and affect are within normal limits. Examination was performed of the: Focused Exam of: the scalp   Examination notable for: 4x3cm pink eroded plaque w/central scaly/calcified crust  Examination limited by: n/a   central frontal scalp 3 o'clock 4x3cm pink eroded plaque w/central serous and scaly crust   central frontal scalp 6 o'clock 4x3cm pink eroded plaque w/central serous and scaly crust  Assessment & Plan  (SKAP)   4x3 cm eroded plaque of mid crown  - Has history of melanoma on crown scalp over 20 yrs ago treated with excision with graft, chemo, and radiation  - Biopsy from 06/2023 showed: ULCER WITH INFLAMED GRANULATION TISSUE WITH HEMORRHAGIC SCALE CRUST. Positive staph aureus. Recommended doxy, mupirocin  but lost to follow up.  - Re presented 11/14/24 with same lesion. Biopsy x 2 showed actinic keratosis. Discussed with pathology who did not see any e/o malignancy and recommended deeper biopsy - Deeper biopsy x 2 obtained today after removal of hyperkeratotic crust  - CT head/neck scheduled for Thursday   Dermatitis - back  - Differential diagnosis, treatment options, prognosis, risk/ benefit, and side effects of treatment were discussed with the patient.  - Start TMC  0.1% ointment to aa's BID PRN. Topical steroids (such as triamcinolone, fluocinolone, fluocinonide, mometasone, clobetasol, halobetasol, betamethasone, hydrocortisone) can cause thinning and lightening of the skin if they are used for too long in the same area. Your physician has selected the right strength medicine for your problem and area affected on the body. Please use your medication only as directed by your physician to prevent side effects.   Level of service outlined above   Patient instructions (SKPI)   Procedures, orders, diagnosis for this visit:  NEOPLASM OF UNCERTAIN BEHAVIOR OF SKIN (2) central frontal scalp 3 o'clock Skin / nail biopsy Type of biopsy: tangential   Informed consent: discussed and consent obtained   Timeout: patient name, date of birth, surgical site, and procedure verified   Procedure prep:  Patient was prepped and draped in usual sterile fashion Prep type:  Isopropyl alcohol Anesthesia: the lesion was anesthetized in a standard fashion   Anesthetic:  1% lidocaine w/ epinephrine 1-100,000 buffered w/ 8.4% NaHCO3 Instrument used: DermaBlade   Instrument used comment:  4 curette Hemostasis achieved with: pressure and aluminum chloride   Outcome: patient tolerated procedure well   Post-procedure details: sterile dressing applied and wound care instructions given   Dressing type: bandage and petrolatum    Specimen 1 - Surgical pathology Differential Diagnosis: D48.5 r/o SCC vs MM vs atypical infection vs other, deeper repeat skin biopsy previously showed AK [id number] Check Margins: No central frontal scalp 6 o'clock Skin / nail biopsy Type of biopsy: tangential   Informed consent: discussed and consent obtained  Timeout: patient name, date of birth, surgical site, and procedure verified   Procedure prep:  Patient was prepped and draped in usual sterile fashion Prep type:  Isopropyl alcohol Anesthesia: the lesion was anesthetized in a standard  fashion   Anesthetic:  1% lidocaine w/ epinephrine 1-100,000 buffered w/ 8.4% NaHCO3 Instrument used: DermaBlade   Instrument used comment:  4 curette Hemostasis achieved with: pressure and aluminum chloride   Outcome: patient tolerated procedure well   Post-procedure details: sterile dressing applied and wound care instructions given   Dressing type: bandage and petrolatum    Specimen 2 - Surgical pathology Differential Diagnosis: D48.5 r/o SCC vs MM vs atypical infection vs other, deeper repeat skin biopsy previously showed AK [id number] Check Margins: No DERMATITIS    Neoplasm of uncertain behavior of skin -     Skin / nail biopsy -     Skin / nail biopsy -     Surgical pathology; Standing  Dermatitis  Other orders -     Triamcinolone Acetonide; Apply to rash on back BID PRN. Avoid applying to face, groin, and axilla. Use as directed. Long-term use can cause thinning of the skin.  Dispense: 80 g; Refill: 0  Return to clinic: Return for follow up pending biopsy results.  LILLETTE Rosina Mayans, CMA, am acting as scribe for Lauraine JAYSON Kanaris, MD .   Documentation: I have reviewed the above documentation for accuracy and completeness, and I agree with the above.  Lauraine JAYSON Kanaris, MD

## 2024-11-28 ENCOUNTER — Ambulatory Visit: Admission: RE | Admit: 2024-11-28 | Discharge: 2024-11-28

## 2024-11-28 DIAGNOSIS — C4440 Unspecified malignant neoplasm of skin of scalp and neck: Secondary | ICD-10-CM | POA: Insufficient documentation

## 2024-11-28 DIAGNOSIS — D489 Neoplasm of uncertain behavior, unspecified: Secondary | ICD-10-CM | POA: Insufficient documentation

## 2024-11-28 LAB — SURGICAL PATHOLOGY

## 2024-11-28 MED ORDER — IOHEXOL 300 MG/ML  SOLN
75.0000 mL | Freq: Once | INTRAMUSCULAR | Status: AC | PRN
  Administered 2024-11-28: 75 mL via INTRAVENOUS

## 2024-12-05 ENCOUNTER — Ambulatory Visit: Payer: Self-pay

## 2024-12-05 NOTE — Progress Notes (Signed)
 Called and discussed new biopsy results, CT head/neck results with patients caregiver Isidoro) and son Lerry). Discussed 2 additional biopsy showing actinic keratosis and CT showing erosive change/scalp defect, no e/o malignancy.   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 Recommended evaluation by surgery for consideration of debridement, deeper sampling vs excision.   Team - can you please see if Dr. Norman at [facility] would see this patient and send him below message via email? Please send most recent notes, path reports, photos, and CT results.   Hi Dr. Norman,  Wanted to see if this is a patient you would be ok seeing or if I should send to General Surgery here.   She is a 89 yo female with history of melanoma on crown of scalp > 20 yrs ago treated with excision/graft, chemo, and radiation. She has about a 1-2 year history of non healing scalp lesion with recent biopsy x 4 showing actinic keratosis. CT head and neck is attached. Ddx - malignancy missed on biopsy vs atypical trigeminal trophic syndrome vs atypical infection. Wondering if she needs debridement/deeper sampling or excision?   Let me know.  Thanks, Lauraine

## 2024-12-05 NOTE — Telephone Encounter (Signed)
 Email has been sent Dr. Vicenta Gee at [facility]. aw

## 2024-12-05 NOTE — Telephone Encounter (Signed)
-----   Message from Lauraine Kanaris, MD sent at 12/05/2024 11:47 AM EST ----- Called and discussed new biopsy results, CT head/neck results with patients caregiver Isidoro) and son Lerry). Discussed 2 additional biopsy showing actinic keratosis and CT showing erosive  change/scalp defect, no e/o malignancy.   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 Recommended evaluation by surgery for  consideration of debridement, deeper sampling vs excision.   Team - can you please see if Dr. Norman at [facility] would see this patient and send him below message via email? Please send most recent notes, path reports, photos, and CT results.   Hi Dr. Norman,  Wanted to see if this is a patient you would be ok seeing or if I should send to General Surgery here.   She is a 89 yo female with history of melanoma on crown of scalp > 20 yrs ago treated with excision/graft, chemo, and radiation. She has about a 1-2 year history of non healing scalp lesion with  recent biopsy x 4 showing actinic keratosis. CT head and neck is attached. Ddx - malignancy missed on biopsy vs atypical trigeminal trophic syndrome vs atypical infection. Wondering if she needs  debridement/deeper sampling or excision?   Let me know.  Thanks, Lauraine

## 2024-12-09 NOTE — Progress Notes (Signed)
 Patient is scheduled for appointment w/Dr. Norman on 12/20/24.
